# Patient Record
Sex: Female | Born: 1980 | Race: White | Hispanic: No | Marital: Married | Smoking: Current every day smoker
Health system: Southern US, Community
[De-identification: ages and names within clinical notes are randomized; demographics above are authoritative.]

## PROBLEM LIST (undated history)

## (undated) DIAGNOSIS — F5000 Anorexia nervosa, unspecified: Secondary | ICD-10-CM

## (undated) DIAGNOSIS — F32A Depression, unspecified: Secondary | ICD-10-CM

## (undated) DIAGNOSIS — I10 Essential (primary) hypertension: Secondary | ICD-10-CM

## (undated) DIAGNOSIS — R519 Headache, unspecified: Secondary | ICD-10-CM

## (undated) DIAGNOSIS — R51 Headache: Secondary | ICD-10-CM

## (undated) DIAGNOSIS — I499 Cardiac arrhythmia, unspecified: Secondary | ICD-10-CM

## (undated) DIAGNOSIS — F419 Anxiety disorder, unspecified: Secondary | ICD-10-CM

## (undated) DIAGNOSIS — F4310 Post-traumatic stress disorder, unspecified: Secondary | ICD-10-CM

## (undated) DIAGNOSIS — F329 Major depressive disorder, single episode, unspecified: Secondary | ICD-10-CM

## (undated) DIAGNOSIS — K8020 Calculus of gallbladder without cholecystitis without obstruction: Secondary | ICD-10-CM

## (undated) HISTORY — PX: CHALAZION EXCISION: SHX213

## (undated) HISTORY — PX: OTHER SURGICAL HISTORY: SHX169

## (undated) HISTORY — PX: FOOT SURGERY: SHX648

---
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
## 1999-03-02 ENCOUNTER — Emergency Department (HOSPITAL_COMMUNITY): Admission: EM | Admit: 1999-03-02 | Discharge: 1999-03-03 | Payer: Self-pay | Admitting: Emergency Medicine

## 1999-03-02 ENCOUNTER — Encounter: Payer: Self-pay | Admitting: Emergency Medicine

## 1999-08-30 ENCOUNTER — Emergency Department (HOSPITAL_COMMUNITY): Admission: EM | Admit: 1999-08-30 | Discharge: 1999-08-30 | Payer: Self-pay | Admitting: Emergency Medicine

## 1999-08-31 ENCOUNTER — Encounter: Payer: Self-pay | Admitting: Emergency Medicine

## 1999-09-01 ENCOUNTER — Emergency Department (HOSPITAL_COMMUNITY): Admission: EM | Admit: 1999-09-01 | Discharge: 1999-09-01 | Payer: Self-pay | Admitting: Emergency Medicine

## 1999-09-05 ENCOUNTER — Inpatient Hospital Stay (HOSPITAL_COMMUNITY): Admission: AD | Admit: 1999-09-05 | Discharge: 1999-09-05 | Payer: Self-pay | Admitting: *Deleted

## 1999-11-11 ENCOUNTER — Inpatient Hospital Stay (HOSPITAL_COMMUNITY): Admission: AD | Admit: 1999-11-11 | Discharge: 1999-11-11 | Payer: Self-pay | Admitting: *Deleted

## 1999-11-13 ENCOUNTER — Inpatient Hospital Stay (HOSPITAL_COMMUNITY): Admission: AD | Admit: 1999-11-13 | Discharge: 1999-11-13 | Payer: Self-pay | Admitting: Obstetrics & Gynecology

## 1999-11-13 ENCOUNTER — Ambulatory Visit (HOSPITAL_COMMUNITY): Admission: RE | Admit: 1999-11-13 | Discharge: 1999-11-13 | Payer: Self-pay | Admitting: Obstetrics & Gynecology

## 1999-12-20 ENCOUNTER — Ambulatory Visit (HOSPITAL_COMMUNITY): Admission: RE | Admit: 1999-12-20 | Discharge: 1999-12-20 | Payer: Self-pay | Admitting: Obstetrics

## 2000-01-10 ENCOUNTER — Inpatient Hospital Stay (HOSPITAL_COMMUNITY): Admission: AD | Admit: 2000-01-10 | Discharge: 2000-01-10 | Payer: Self-pay | Admitting: Obstetrics

## 2000-01-23 ENCOUNTER — Inpatient Hospital Stay (HOSPITAL_COMMUNITY): Admission: AD | Admit: 2000-01-23 | Discharge: 2000-01-23 | Payer: Self-pay | Admitting: *Deleted

## 2000-01-24 ENCOUNTER — Inpatient Hospital Stay (HOSPITAL_COMMUNITY): Admission: AD | Admit: 2000-01-24 | Discharge: 2000-01-24 | Payer: Self-pay | Admitting: Obstetrics

## 2000-01-26 ENCOUNTER — Inpatient Hospital Stay (HOSPITAL_COMMUNITY): Admission: AD | Admit: 2000-01-26 | Discharge: 2000-01-29 | Payer: Self-pay | Admitting: Obstetrics & Gynecology

## 2000-01-26 ENCOUNTER — Encounter: Payer: Self-pay | Admitting: *Deleted

## 2000-02-08 ENCOUNTER — Encounter: Admission: RE | Admit: 2000-02-08 | Discharge: 2000-02-08 | Payer: Self-pay | Admitting: Obstetrics

## 2000-02-13 ENCOUNTER — Inpatient Hospital Stay (HOSPITAL_COMMUNITY): Admission: AD | Admit: 2000-02-13 | Discharge: 2000-02-13 | Payer: Self-pay | Admitting: Obstetrics

## 2000-02-13 ENCOUNTER — Inpatient Hospital Stay (HOSPITAL_COMMUNITY): Admission: AD | Admit: 2000-02-13 | Discharge: 2000-02-13 | Payer: Self-pay | Admitting: *Deleted

## 2000-02-13 ENCOUNTER — Inpatient Hospital Stay (HOSPITAL_COMMUNITY): Admission: EM | Admit: 2000-02-13 | Discharge: 2000-02-13 | Payer: Self-pay | Admitting: *Deleted

## 2000-02-15 ENCOUNTER — Inpatient Hospital Stay (HOSPITAL_COMMUNITY): Admission: AD | Admit: 2000-02-15 | Discharge: 2000-02-15 | Payer: Self-pay | Admitting: Obstetrics & Gynecology

## 2000-02-19 ENCOUNTER — Inpatient Hospital Stay (HOSPITAL_COMMUNITY): Admission: AD | Admit: 2000-02-19 | Discharge: 2000-02-19 | Payer: Self-pay | Admitting: Obstetrics & Gynecology

## 2000-02-22 ENCOUNTER — Encounter: Admission: RE | Admit: 2000-02-22 | Discharge: 2000-02-22 | Payer: Self-pay | Admitting: Obstetrics

## 2000-02-26 ENCOUNTER — Inpatient Hospital Stay (HOSPITAL_COMMUNITY): Admission: AD | Admit: 2000-02-26 | Discharge: 2000-02-26 | Payer: Self-pay | Admitting: Obstetrics & Gynecology

## 2000-02-29 ENCOUNTER — Inpatient Hospital Stay (HOSPITAL_COMMUNITY): Admission: AD | Admit: 2000-02-29 | Discharge: 2000-02-29 | Payer: Self-pay | Admitting: Obstetrics

## 2000-02-29 ENCOUNTER — Encounter: Admission: RE | Admit: 2000-02-29 | Discharge: 2000-02-29 | Payer: Self-pay | Admitting: Obstetrics

## 2000-02-29 ENCOUNTER — Inpatient Hospital Stay (HOSPITAL_COMMUNITY): Admission: AD | Admit: 2000-02-29 | Discharge: 2000-02-29 | Payer: Self-pay | Admitting: Obstetrics & Gynecology

## 2000-03-05 ENCOUNTER — Inpatient Hospital Stay (HOSPITAL_COMMUNITY): Admission: AD | Admit: 2000-03-05 | Discharge: 2000-03-05 | Payer: Self-pay | Admitting: Obstetrics

## 2000-03-07 ENCOUNTER — Encounter: Admission: RE | Admit: 2000-03-07 | Discharge: 2000-03-07 | Payer: Self-pay | Admitting: Obstetrics

## 2000-03-11 ENCOUNTER — Inpatient Hospital Stay (HOSPITAL_COMMUNITY): Admission: AD | Admit: 2000-03-11 | Discharge: 2000-03-11 | Payer: Self-pay | Admitting: Obstetrics

## 2000-03-14 ENCOUNTER — Encounter: Admission: RE | Admit: 2000-03-14 | Discharge: 2000-03-14 | Payer: Self-pay | Admitting: Obstetrics

## 2000-03-19 ENCOUNTER — Encounter: Payer: Self-pay | Admitting: *Deleted

## 2000-03-19 ENCOUNTER — Inpatient Hospital Stay (HOSPITAL_COMMUNITY): Admission: AD | Admit: 2000-03-19 | Discharge: 2000-03-24 | Payer: Self-pay | Admitting: *Deleted

## 2000-03-28 ENCOUNTER — Inpatient Hospital Stay (HOSPITAL_COMMUNITY): Admission: AD | Admit: 2000-03-28 | Discharge: 2000-03-28 | Payer: Self-pay | Admitting: Obstetrics & Gynecology

## 2000-03-28 ENCOUNTER — Encounter: Admission: RE | Admit: 2000-03-28 | Discharge: 2000-03-28 | Payer: Self-pay | Admitting: Obstetrics

## 2000-03-28 ENCOUNTER — Encounter (HOSPITAL_COMMUNITY): Admission: RE | Admit: 2000-03-28 | Discharge: 2000-04-25 | Payer: Self-pay | Admitting: Obstetrics

## 2000-04-04 ENCOUNTER — Encounter: Admission: RE | Admit: 2000-04-04 | Discharge: 2000-04-04 | Payer: Self-pay | Admitting: Obstetrics

## 2000-04-09 ENCOUNTER — Inpatient Hospital Stay (HOSPITAL_COMMUNITY): Admission: AD | Admit: 2000-04-09 | Discharge: 2000-04-09 | Payer: Self-pay | Admitting: Obstetrics

## 2000-04-10 ENCOUNTER — Inpatient Hospital Stay (HOSPITAL_COMMUNITY): Admission: AD | Admit: 2000-04-10 | Discharge: 2000-04-10 | Payer: Self-pay | Admitting: Obstetrics

## 2000-04-10 ENCOUNTER — Inpatient Hospital Stay (HOSPITAL_COMMUNITY): Admission: AD | Admit: 2000-04-10 | Discharge: 2000-04-10 | Payer: Self-pay | Admitting: *Deleted

## 2000-04-11 ENCOUNTER — Encounter: Admission: RE | Admit: 2000-04-11 | Discharge: 2000-04-11 | Payer: Self-pay | Admitting: Obstetrics

## 2000-04-14 ENCOUNTER — Observation Stay (HOSPITAL_COMMUNITY): Admission: AD | Admit: 2000-04-14 | Discharge: 2000-04-15 | Payer: Self-pay | Admitting: *Deleted

## 2000-04-16 ENCOUNTER — Inpatient Hospital Stay (HOSPITAL_COMMUNITY): Admission: AD | Admit: 2000-04-16 | Discharge: 2000-04-16 | Payer: Self-pay | Admitting: Obstetrics & Gynecology

## 2000-04-18 ENCOUNTER — Encounter: Admission: RE | Admit: 2000-04-18 | Discharge: 2000-04-18 | Payer: Self-pay | Admitting: Obstetrics & Gynecology

## 2000-04-21 ENCOUNTER — Inpatient Hospital Stay (HOSPITAL_COMMUNITY): Admission: AD | Admit: 2000-04-21 | Discharge: 2000-04-21 | Payer: Self-pay | Admitting: Obstetrics

## 2000-04-22 ENCOUNTER — Inpatient Hospital Stay (HOSPITAL_COMMUNITY): Admission: AD | Admit: 2000-04-22 | Discharge: 2000-04-22 | Payer: Self-pay | Admitting: Obstetrics

## 2000-04-23 ENCOUNTER — Inpatient Hospital Stay (HOSPITAL_COMMUNITY): Admission: AD | Admit: 2000-04-23 | Discharge: 2000-04-25 | Payer: Self-pay | Admitting: *Deleted

## 2000-05-04 ENCOUNTER — Inpatient Hospital Stay (HOSPITAL_COMMUNITY): Admission: AD | Admit: 2000-05-04 | Discharge: 2000-05-04 | Payer: Self-pay | Admitting: *Deleted

## 2000-05-06 ENCOUNTER — Inpatient Hospital Stay (HOSPITAL_COMMUNITY): Admission: AD | Admit: 2000-05-06 | Discharge: 2000-05-06 | Payer: Self-pay | Admitting: Maternal and Fetal Medicine

## 2000-07-23 ENCOUNTER — Encounter: Payer: Self-pay | Admitting: Nephrology

## 2000-07-23 ENCOUNTER — Ambulatory Visit (HOSPITAL_COMMUNITY): Admission: RE | Admit: 2000-07-23 | Discharge: 2000-07-23 | Payer: Self-pay | Admitting: Family Medicine

## 2000-12-24 HISTORY — PX: WISDOM TOOTH EXTRACTION: SHX21

## 2000-12-24 HISTORY — PX: TUBAL LIGATION: SHX77

## 2001-02-03 ENCOUNTER — Inpatient Hospital Stay (HOSPITAL_COMMUNITY): Admission: AD | Admit: 2001-02-03 | Discharge: 2001-02-03 | Payer: Self-pay | Admitting: *Deleted

## 2001-02-03 ENCOUNTER — Encounter: Payer: Self-pay | Admitting: Obstetrics

## 2001-04-01 ENCOUNTER — Inpatient Hospital Stay (HOSPITAL_COMMUNITY): Admission: AD | Admit: 2001-04-01 | Discharge: 2001-04-01 | Payer: Self-pay | Admitting: *Deleted

## 2001-04-01 ENCOUNTER — Encounter: Payer: Self-pay | Admitting: Obstetrics and Gynecology

## 2001-04-29 ENCOUNTER — Other Ambulatory Visit: Admission: RE | Admit: 2001-04-29 | Discharge: 2001-04-29 | Payer: Self-pay | Admitting: Obstetrics and Gynecology

## 2001-05-20 ENCOUNTER — Ambulatory Visit: Admission: RE | Admit: 2001-05-20 | Discharge: 2001-05-20 | Payer: Self-pay | Admitting: Obstetrics and Gynecology

## 2001-05-20 ENCOUNTER — Encounter: Payer: Self-pay | Admitting: Obstetrics and Gynecology

## 2001-05-27 ENCOUNTER — Other Ambulatory Visit: Admission: RE | Admit: 2001-05-27 | Discharge: 2001-05-27 | Payer: Self-pay | Admitting: Obstetrics and Gynecology

## 2001-06-11 ENCOUNTER — Inpatient Hospital Stay (HOSPITAL_COMMUNITY): Admission: AD | Admit: 2001-06-11 | Discharge: 2001-06-11 | Payer: Self-pay | Admitting: Obstetrics and Gynecology

## 2001-06-14 ENCOUNTER — Inpatient Hospital Stay (HOSPITAL_COMMUNITY): Admission: AD | Admit: 2001-06-14 | Discharge: 2001-06-14 | Payer: Self-pay | Admitting: Obstetrics and Gynecology

## 2001-06-15 ENCOUNTER — Inpatient Hospital Stay (HOSPITAL_COMMUNITY): Admission: AD | Admit: 2001-06-15 | Discharge: 2001-06-15 | Payer: Self-pay | Admitting: Obstetrics and Gynecology

## 2001-06-16 ENCOUNTER — Inpatient Hospital Stay (HOSPITAL_COMMUNITY): Admission: AD | Admit: 2001-06-16 | Discharge: 2001-06-16 | Payer: Self-pay | Admitting: Obstetrics and Gynecology

## 2001-06-22 ENCOUNTER — Inpatient Hospital Stay (HOSPITAL_COMMUNITY): Admission: AD | Admit: 2001-06-22 | Discharge: 2001-06-22 | Payer: Self-pay | Admitting: Obstetrics and Gynecology

## 2001-06-29 ENCOUNTER — Inpatient Hospital Stay (HOSPITAL_COMMUNITY): Admission: AD | Admit: 2001-06-29 | Discharge: 2001-06-29 | Payer: Self-pay | Admitting: Obstetrics and Gynecology

## 2001-07-03 ENCOUNTER — Inpatient Hospital Stay (HOSPITAL_COMMUNITY): Admission: AD | Admit: 2001-07-03 | Discharge: 2001-07-03 | Payer: Self-pay | Admitting: Obstetrics and Gynecology

## 2001-07-27 ENCOUNTER — Inpatient Hospital Stay (HOSPITAL_COMMUNITY): Admission: AD | Admit: 2001-07-27 | Discharge: 2001-07-30 | Payer: Self-pay | Admitting: Obstetrics and Gynecology

## 2001-11-18 ENCOUNTER — Encounter (INDEPENDENT_AMBULATORY_CARE_PROVIDER_SITE_OTHER): Payer: Self-pay | Admitting: Specialist

## 2001-11-18 ENCOUNTER — Ambulatory Visit (HOSPITAL_COMMUNITY): Admission: RE | Admit: 2001-11-18 | Discharge: 2001-11-18 | Payer: Self-pay | Admitting: Obstetrics and Gynecology

## 2001-12-24 ENCOUNTER — Emergency Department (HOSPITAL_COMMUNITY): Admission: EM | Admit: 2001-12-24 | Discharge: 2001-12-24 | Payer: Self-pay | Admitting: Emergency Medicine

## 2001-12-24 ENCOUNTER — Encounter: Payer: Self-pay | Admitting: Emergency Medicine

## 2001-12-31 ENCOUNTER — Inpatient Hospital Stay (HOSPITAL_COMMUNITY): Admission: AD | Admit: 2001-12-31 | Discharge: 2001-12-31 | Payer: Self-pay | Admitting: Obstetrics

## 2002-03-24 ENCOUNTER — Other Ambulatory Visit: Admission: RE | Admit: 2002-03-24 | Discharge: 2002-03-24 | Payer: Self-pay | Admitting: Obstetrics and Gynecology

## 2002-05-13 ENCOUNTER — Emergency Department (HOSPITAL_COMMUNITY): Admission: EM | Admit: 2002-05-13 | Discharge: 2002-05-13 | Payer: Self-pay | Admitting: Emergency Medicine

## 2002-05-13 ENCOUNTER — Encounter: Payer: Self-pay | Admitting: Emergency Medicine

## 2002-06-15 ENCOUNTER — Other Ambulatory Visit: Admission: RE | Admit: 2002-06-15 | Discharge: 2002-06-15 | Payer: Self-pay | Admitting: Obstetrics and Gynecology

## 2002-10-05 ENCOUNTER — Ambulatory Visit (HOSPITAL_COMMUNITY): Admission: RE | Admit: 2002-10-05 | Discharge: 2002-10-05 | Payer: Self-pay | Admitting: Family Medicine

## 2002-10-05 ENCOUNTER — Encounter: Payer: Self-pay | Admitting: Family Medicine

## 2003-09-16 ENCOUNTER — Inpatient Hospital Stay (HOSPITAL_COMMUNITY): Admission: AD | Admit: 2003-09-16 | Discharge: 2003-09-16 | Payer: Self-pay | Admitting: *Deleted

## 2003-09-22 ENCOUNTER — Encounter: Payer: Self-pay | Admitting: Emergency Medicine

## 2003-09-22 ENCOUNTER — Emergency Department (HOSPITAL_COMMUNITY): Admission: EM | Admit: 2003-09-22 | Discharge: 2003-09-22 | Payer: Self-pay

## 2003-12-31 ENCOUNTER — Emergency Department (HOSPITAL_COMMUNITY): Admission: EM | Admit: 2003-12-31 | Discharge: 2003-12-31 | Payer: Self-pay | Admitting: Emergency Medicine

## 2004-02-18 ENCOUNTER — Emergency Department (HOSPITAL_COMMUNITY): Admission: EM | Admit: 2004-02-18 | Discharge: 2004-02-18 | Payer: Self-pay | Admitting: Emergency Medicine

## 2004-06-19 ENCOUNTER — Encounter: Admission: RE | Admit: 2004-06-19 | Discharge: 2004-06-19 | Payer: Self-pay | Admitting: Family Medicine

## 2004-10-24 ENCOUNTER — Emergency Department (HOSPITAL_COMMUNITY): Admission: EM | Admit: 2004-10-24 | Discharge: 2004-10-24 | Payer: Self-pay | Admitting: Emergency Medicine

## 2005-04-10 ENCOUNTER — Emergency Department (HOSPITAL_COMMUNITY): Admission: EM | Admit: 2005-04-10 | Discharge: 2005-04-10 | Payer: Self-pay | Admitting: Emergency Medicine

## 2005-04-12 ENCOUNTER — Other Ambulatory Visit: Admission: RE | Admit: 2005-04-12 | Discharge: 2005-04-12 | Payer: Self-pay | Admitting: Family Medicine

## 2007-10-29 ENCOUNTER — Emergency Department (HOSPITAL_COMMUNITY): Admission: EM | Admit: 2007-10-29 | Discharge: 2007-10-29 | Payer: Self-pay | Admitting: Emergency Medicine

## 2008-10-09 ENCOUNTER — Emergency Department (HOSPITAL_COMMUNITY): Admission: EM | Admit: 2008-10-09 | Discharge: 2008-10-09 | Payer: Self-pay | Admitting: Emergency Medicine

## 2009-03-16 ENCOUNTER — Emergency Department (HOSPITAL_COMMUNITY): Admission: EM | Admit: 2009-03-16 | Discharge: 2009-03-16 | Payer: Self-pay | Admitting: Emergency Medicine

## 2009-08-11 ENCOUNTER — Emergency Department (HOSPITAL_COMMUNITY): Admission: EM | Admit: 2009-08-11 | Discharge: 2009-08-11 | Payer: Self-pay | Admitting: Emergency Medicine

## 2009-08-15 ENCOUNTER — Emergency Department (HOSPITAL_COMMUNITY): Admission: EM | Admit: 2009-08-15 | Discharge: 2009-08-15 | Payer: Self-pay | Admitting: Emergency Medicine

## 2010-03-23 ENCOUNTER — Emergency Department (HOSPITAL_COMMUNITY): Admission: EM | Admit: 2010-03-23 | Discharge: 2010-03-24 | Payer: Self-pay | Admitting: Emergency Medicine

## 2010-03-27 ENCOUNTER — Emergency Department (HOSPITAL_COMMUNITY): Admission: EM | Admit: 2010-03-27 | Discharge: 2010-03-27 | Payer: Self-pay | Admitting: Emergency Medicine

## 2011-03-14 LAB — CBC
MCV: 103.4 fL — ABNORMAL HIGH (ref 78.0–100.0)
Platelets: 252 10*3/uL (ref 150–400)
WBC: 7.1 10*3/uL (ref 4.0–10.5)

## 2011-03-14 LAB — PREGNANCY, URINE: Preg Test, Ur: NEGATIVE

## 2011-03-14 LAB — BASIC METABOLIC PANEL
BUN: 7 mg/dL (ref 6–23)
Chloride: 107 mEq/L (ref 96–112)
Creatinine, Ser: 0.68 mg/dL (ref 0.4–1.2)
GFR calc non Af Amer: >60 mL/min (ref >60)

## 2011-03-14 LAB — URINALYSIS, ROUTINE W REFLEX MICROSCOPIC
Ketones, ur: NEGATIVE mg/dL
Nitrite: NEGATIVE
Protein, ur: NEGATIVE mg/dL
Urobilinogen, UA: 1 mg/dL (ref 0.0–1.0)
pH: 6.5 (ref 5.0–8.0)

## 2011-03-14 LAB — DIFFERENTIAL
Eosinophils Absolute: 0.2 10*3/uL (ref 0.0–0.7)
Lymphs Abs: 1.7 10*3/uL (ref 0.7–4.0)
Neutrophils Relative %: 64 % (ref 43–77)

## 2011-03-18 LAB — CBC
HCT: 48.5 % — ABNORMAL HIGH (ref 36.0–46.0)
MCV: 102.5 fL — ABNORMAL HIGH (ref 78.0–100.0)
Platelets: 291 10*3/uL (ref 150–400)
RBC: 4.73 MIL/uL (ref 3.87–5.11)
RDW: 12.7 % (ref 11.5–15.5)
WBC: 8.6 10*3/uL (ref 4.0–10.5)

## 2011-03-18 LAB — DIFFERENTIAL
Eosinophils Absolute: 0.3 10*3/uL (ref 0.0–0.7)
Lymphs Abs: 2.9 10*3/uL (ref 0.7–4.0)
Monocytes Absolute: 0.6 10*3/uL (ref 0.1–1.0)
Monocytes Relative: 7 % (ref 3–12)
Neutrophils Relative %: 55 % (ref 43–77)

## 2011-03-18 LAB — RAPID URINE DRUG SCREEN, HOSP PERFORMED
Amphetamines: NOT DETECTED
Barbiturates: NOT DETECTED
Benzodiazepines: POSITIVE — AB
Opiates: POSITIVE — AB
Tetrahydrocannabinol: POSITIVE — AB

## 2011-03-18 LAB — COMPREHENSIVE METABOLIC PANEL
ALT: 19 U/L (ref 0–35)
Albumin: 3.8 g/dL (ref 3.5–5.2)
Calcium: 8.1 mg/dL — ABNORMAL LOW (ref 8.4–10.5)
GFR calc Af Amer: >60 mL/min (ref >60)
Glucose, Bld: 98 mg/dL (ref 70–99)
Sodium: 140 mEq/L (ref 135–145)
Total Protein: 6.9 g/dL (ref 6.0–8.3)

## 2011-03-18 LAB — URINALYSIS, ROUTINE W REFLEX MICROSCOPIC
Bilirubin Urine: NEGATIVE
Ketones, ur: NEGATIVE mg/dL
Nitrite: NEGATIVE
Protein, ur: NEGATIVE mg/dL
Specific Gravity, Urine: 1.011 (ref 1.005–1.030)
Urobilinogen, UA: 1 mg/dL (ref 0.0–1.0)

## 2011-03-31 LAB — DIFFERENTIAL
Basophils Absolute: 0 10*3/uL (ref 0.0–0.1)
Basophils Relative: 0 % (ref 0–1)
Basophils Relative: 1 % (ref 0–1)
Eosinophils Absolute: 0.2 10*3/uL (ref 0.0–0.7)
Lymphocytes Relative: 15 % (ref 12–46)
Lymphs Abs: 1.7 10*3/uL (ref 0.7–4.0)
Neutro Abs: 8.4 10*3/uL — ABNORMAL HIGH (ref 1.7–7.7)
Neutro Abs: 8.4 10*3/uL — ABNORMAL HIGH (ref 1.7–7.7)
Neutrophils Relative %: 76 % (ref 43–77)
Neutrophils Relative %: 77 % (ref 43–77)

## 2011-03-31 LAB — COMPREHENSIVE METABOLIC PANEL
ALT: 22 U/L (ref 0–35)
AST: 29 U/L (ref 0–37)
Albumin: 3.8 g/dL (ref 3.5–5.2)
Alkaline Phosphatase: 47 U/L (ref 39–117)
BUN: 3 mg/dL — ABNORMAL LOW (ref 6–23)
CO2: 24 mEq/L (ref 19–32)
Calcium: 9 mg/dL (ref 8.4–10.5)
Calcium: 9 mg/dL (ref 8.4–10.5)
Creatinine, Ser: 0.74 mg/dL (ref 0.4–1.2)
GFR calc Af Amer: >60 mL/min (ref >60)
GFR calc non Af Amer: >60 mL/min (ref >60)
Glucose, Bld: 95 mg/dL (ref 70–99)
Sodium: 139 mEq/L (ref 135–145)

## 2011-03-31 LAB — CK TOTAL AND CKMB (NOT AT ARMC): Relative Index: INVALID (ref 0.0–2.5)

## 2011-03-31 LAB — CBC
MCHC: 34.8 g/dL (ref 30.0–36.0)
MCHC: 34.8 g/dL (ref 30.0–36.0)
MCV: 98.8 fL (ref 78.0–100.0)
MCV: 99.1 fL (ref 78.0–100.0)
Platelets: 194 10*3/uL (ref 150–400)
RBC: 4.89 MIL/uL (ref 3.87–5.11)
RDW: 13.9 % (ref 11.5–15.5)
WBC: 10.9 10*3/uL — ABNORMAL HIGH (ref 4.0–10.5)

## 2011-03-31 LAB — TROPONIN I: Troponin I: 0.01 ng/mL (ref 0.00–0.06)

## 2011-03-31 LAB — POCT CARDIAC MARKERS
CKMB, poc: <1 ng/mL — ABNORMAL LOW (ref 1.0–8.0)
Myoglobin, poc: 41.3 ng/mL (ref 12–200)

## 2011-03-31 LAB — LIPASE, BLOOD: Lipase: 15 U/L (ref 11–59)

## 2011-03-31 LAB — D-DIMER, QUANTITATIVE: D-Dimer, Quant: 0.24 ug/mL-FEU (ref 0.00–0.48)

## 2011-05-11 NOTE — Op Note (Signed)
[facility]  Patient:    Gina Daniels, Gina Daniels Visit Number: [id number] MRN: [id number]          Service Type: DSU Location: [facility] Attending Physician:  Jaymes Graff A Dictated by:   Pierre Bali Normand Sloop, M.D. Admit Date:  11/18/2001                             Operative Report  PREOPERATIVE DIAGNOSES:       Multiparity, desires sterilization, cervical dysplasia with chronic pelvic pain.  POSTOPERATIVE DIAGNOSES:      Probable endometriosis, multiparity, desires sterilization.  PROCEDURE:                    Diagnostic laparoscopy, bilateral tubal ligation with fulguration, biopsy of cul-de-sac/peritoneum, loop electrosurgical excision procedure, endocervical curettage.  SURGEON:                      Naima A. Normand Sloop, M.D.  ASSISTANT:                    Janine Limbo, M.D.  ESTIMATED BLOOD LOSS:         Minimal.  INTRAVENOUS FLUIDS:           Crystalloid 1600 cc.  COMPLICATIONS:                None.  FINDINGS:                     Powder burn lesions in the cul-de-sac and along the right ureter, peritoneum.  No other lesions identified.  Normal appearing uterus, tubes, and ovaries bilaterally.  Normal appendix.  Normal abdominal anatomy.  Patient to recovery room in stable condition.  PROCEDURE IN DETAIL:          The patient was taken to the operating room where she was placed in the dorsal lithotomy position and prepped and draped in a normal sterile fashion.  A Vivelle speculum was then placed into the vagina.  The anterior lip of the cervix was grasped with a single tooth tenaculum.  An endocervical curettage was done and the acorn was attached to the tenaculum with a ______ manipulated the uterus.  Attention was then turned to the abdomen where a 10 mm infraumbilical horizontal incision was made with a knife and the Veress needle was placed into the abdominal cavity while tenting the abdominal wall at a 45 degree angle.  Fluid filled  syringe and decrease in pressure with CO2 gas confirmed proper intra-abdominal placement.  The abdomen was filled with 3.5 L CO2 gas.  A 10 mm trocar was placed without difficulty and intra-abdominal placement was confirmed by the laparoscope.  Attentive survey of the abdomen and pelvis was done starting above the uterus at the bladder and then both tubes and ovaries were identified and noted to be normal.  The uterosacrals were normal.  There were some powder burn lesions in the cul-de-sac, along the peritoneum, and along the left ureter.  Appendix was normal.  Liver was normal.  Bowels were normal. All other remaining areas were normal.  At this point a needle was placed into the peritoneum and normal saline was introduced as a means to elevate the peritoneum and then a biopsy was taken of the tissue.  Hemostasis was assured. Attention was then turned to the patients tubes where a Klepinger was placed in the mid isthmic portion of  the tube.  The right fallopian tube was identified, followed to the fimbriated end.  Klepingers were placed in the mid isthmic portion of the tube and fulgurated x 2.  Hemostasis was noted. The patients left fallopian tube was identified and manipulated in a similar fashion and fulgurated x 2.  Hemostasis was noted.  All instruments were removed from the abdomen and direct visualization with the laparoscope after desufflation of the CO2 gas.  Hemostasis was noted.  Before the survey of the abdomen was done a second 5 mm port was placed 2 cm above the symphysis pubis. A 5 mm horizontal skin incision was made in the midline and a 5 mm trocar was placed under direct visualization.  At that time then the survey of the abdomen and pelvis occurred followed by the procedures.  All instruments were removed under direct visualization with the laparoscope.  Sponge, lap, and needle counts were correct x 2.  The suprapubic incision was repaired with 4-0 Vicryl in a  subcuticular fashion.  The fascia and the 10 mm port were closed with 0 Vicryl and a urological GU needle.  The skin was closed with 4-0 Vicryl in a subcuticular fashion.  Attention was then turned to the patients vagina where the coated Vivelle speculum was placed into the vagina.  The cervix was then painted with saline first and then Lugol and abnormal areas were seen at 12, 11, and 1 oclock.  Size 11 x 15 mm loop was used to remove the abnormal areas of the cervix.  The remaining cervical bed was made hemostatic using bulb cautery.  Lugol was then applied.  Hemostasis was noted.  The tenaculum was removed from the cervix with good hemostasis noted.  All instruments removed from the vagina.  Sponge, lap, and needle counts were correct x 2. Patient went to the recovery room in stable condition. Dictated by:   Pierre Bali. Normand Sloop, M.D. Attending Physician:  Michael Litter DD:  11/18/01 TD:  11/18/01 Job: [id number]

## 2011-05-11 NOTE — H&P (Signed)
[facility]  Patient:    Gina Daniels, Gina Daniels Visit Number: [id number] MRN: [id number]          Service Type: OBS Location: 910B 9162 01 Attending Physician:  Shaune Spittle Dictated by:   Philipp Deputy, C.N.M. Admit Date:  07/27/2001                           History and Physical  CONTINUATION  DATE OF BIRTH:                1981/03/19.  OBSTETRICAL HISTORY: (Continued) In July of 2001 at approximately three weeks gestation she had a spontaneous Ab with no D&C and no complications. Present pregnancy is her fourth pregnancy.  PAST MEDICAL HISTORY:         She reports having had the usual childhood illnesses. For contraception in the past she has used oral contraceptives which she discontinued in December of 2001. She has a history of colposcopy in 2001 and in 2002 with her abnormal Pap smears. She reports having a urinary tract infection in 1999 and she reports being a smoker in the past which she discontinued in December of 2001 with the pregnancy.  ALLERGIES:                    LATEX, PENICILLIN, CODEINE, and to SULFA. She gets shortness of breath and rash with all of these.  FAMILY HISTORY:               She has a father with myocardial infarctions x 2. Mother with chronic hypertension. Maternal grandmother with asthma. Maternal grandmother with insulin-dependent diabetes mellitus who is deceased.  PAST SURGICAL HISTORY:        Negative.  GENETIC HISTORY:              Negative.  SOCIAL HISTORY:               Patient is single. Father of the babys name is Fayrene Fearing. He is involved and supportive. Patient has a high school education. Father of the baby has a tenth grade education. They deny any alcohol, tobacco, or illicit drug use after the positive pregnancy test.  OBJECTIVE DATA:  VITAL SIGNS:                  Stable. She is afebrile.  HEENT:                        Within normal limits.  CHEST:                        Clear to  auscultation.  HEART:                        Regular to rate and rhythm.  ABDOMEN:                      Gravid in its contour with uterine contractions approximately every six minutes which are mild. Fetal heart rate is reactive and reassuring.  PELVIC:                       Cervical exam is 2 to 3 cm, 80% effaced, vertex, -2 with copious clear fluid that is positive Nitrazine and positive fern.  EXTREMITIES:  Within normal limits.  ASSESSMENT:                   1. Intrauterine pregnancy at 36 weeks.                               2. Spontaneous rupture of membranes with clear                                  fluid.                               3. Negative group B streptococcus.  PLAN:                         1. Admit to birthing suite per consult with                                  Dr. Pennie Rushing.                               2. Routine CNM orders.                               3. Discuss options with the patient and father                                  of the baby regarding expectant management                                  versus Pitocin augmentation. Patient and the                                  father of the baby elect expectant                                  management at this time. Dictated by:   Philipp Deputy, C.N.M. Attending Physician:  Shaune Spittle DD:  07/27/01 TD:  07/27/01 Job: [id number]

## 2011-05-11 NOTE — Discharge Summary (Signed)
[facility]  Patient:    Gina Daniels                        MRN: [id number] Adm. Date:  [id number] Disc. Date: 01/28/00 Attending:  Antionette Char Dictator:   Brandt Loosen, M.D.                           Discharge Summary  DISCHARGE DIAGNOSES:          1. Cervicitis.                               2. Preterm contractions.                               3. Viral gastroenteritis.  DISCHARGE MEDICATIONS:        1. Clindamycin 300 mg p.o. q.i.d. x 7 days.                               2. Motrin 600 mg q.6h. x 48 hours, not to exceed.  FOLLOWUP INSTRUCTIONS:        The patient is to return for any signs of labor such as uterine contractions, bleeding, gush of fluids, decreased fetal movement. The patient is to follow up at [facility].  She is to call on Monday for an appointment to be seen in one week.  HOSPITAL COURSE:              #1 - CERVICITIS:  The patient was admitted on February 1 and found to have ______ red cervix with wbcs too numerous to count with too numerous to count bacteria.  Thus far, cultures negative, negative for GC and Chlamydia.  She continued to have occasional discharge along with the diarrhea. She had one episode of "fluid leaking" on speculum examination, burning and Nitrazine negative on admission ferning and Nitrazine negative.  No evidence of  rupture of membranes.  The patient was placed on IV clindamycin and gentamicin ith improvement in discharge and will be discharged home on clindamycin 300 mg p.o.  q.i.d. x 7 days.                                #2 - PRETERM CONTRACTIONS:  Patient with contractions every 10 minutes with evidence of mild dehydration secondary to a diarrheal illness.  She was placed on IV fluids with improvement in her hydration status.  She continued to have occasional irritability but had no cervical hospitalization throughout 72 hours of admission.  The patient is to be discharged  with Motrin 600 mg p.o. q.6h. x 48 hours and to follow [facility] at [facility].                                #3 - VIRAL GASTROENTERITIS:  Patient admitted with a diarrheal illness of two to three bouts per day of watery diarrhea and no blood  noted in the stool.  This improved throughout the hospitalization and was resolved by the time of discharge.  The patient had been placed on Imodium AD during hospitalization with resolution of the diarrhea.  The patient is to return for ny signs of preterm labor. DD:  01/28/00 TD:  01/29/00 Job: [id number]

## 2011-05-11 NOTE — H&P (Signed)
[facility]  Patient:    Gina Daniels, Gina Daniels Visit Number: [id number] MRN: [id number]          Service Type: Attending:  Naima A. Normand Sloop, M.D. Dictated by:   Pierre Bali. Normand Sloop, M.D.                           History and Physical  OFFICE MEDICAL RECORD NUMBER:  [phone]  HISTORY OF PRESENT ILLNESS:   Patient is a 30 year old G26, P2-0-2-2, who presented to my office complaining of pelvic pain, desire for tubal ligation and the need for a LEEP.  The patient presented in June after having infant, saying that she was having some pelvic pain and had had it for some time, which was unrelieved by pain medications, especially during a routine GYN exam.  Patient says that she has tried over-the-counter pain medicine without any relief.  Her cultures done on September 23, 2001 were found to be negative. Her ultrasound done October 28, 2001 was found to be with no active disease. Patient has a retroverted uterus measuring 7.8 x 3.1 x 4.0, normal ovaries bilaterally.  Patient denied having any change in bowel habits or bladder habits.  Patient has no significant past medical or surgical history.  PAST GYNECOLOGICAL HISTORY:   Significant for abnormal Pap smear.  Patient had a colposcopy on September 22, 2001 for high-grade squamous intraepithelial lesion.  On the colposcopy, there was found to be some mosaicism at 1 oclock and acetowhite changes at 6 and 2 oclock.  The patients biopsy at 6 oclock was significant for CIN-1 and the biopsy at 1 oclock was significant for moderate dysplasia, CIN-2.  The ECC was benign.  Patient has had abnormal Pap smear and colposcopy in the past.  FAMILY HISTORY:               Significant for paternal grandmother with breast cancer and a mother with hypertension and maternal grandmother with diabetes. No ovarian cancer.  SOCIAL HISTORY:               Patient smokes a half a pack to one and a half pack a day for seven years, occasional  alcohol use and no illicit drug use.  ALLERGIES:                    Her allergies include PENICILLIN, CODEINE, SULFA, LATEX and ADHESIVES.  PHYSICAL EXAMINATION:  VITAL SIGNS:                  Patients blood pressure was 100/70.  HEART:                        Regular.  LUNGS:                        Clear.  ABDOMEN:                      Soft and nontender.  PELVIC:                       Cervix:  She had mild CMT without any lesions. Uterus was normal size, shape and consistency.  Adnexa had no masses.  ASSESSMENT:                   1. 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                               2. Colposcopic biopsy at 1 oclock -- moderate                                  dysplasia, cervical intraepithelial                                  neoplasia, grade 2.  Endocervical curettage                                  was negative.                               3. Chronic pelvic pain.                               4. Desires sterilization.  PLAN:                         Patient is for diagnostic scope, bilateral tubal ligation.  Tubal papers were signed.  The risks and benefits of the tubal being but not limited to bleeding, infection, damage to uterus, tubes, ovaries, bowel, bladder and ureters were explained to the patient.  Also, because of the patients young age, she was given a review of all forms of contraception; she still was adamant about having the tubal ligation, even telling that she probably would have regret.  Will also, if endometriosis at the time of the tubal, due to the chronic pelvic pain, perform a diagnostic laparoscopy.  Patient also for LEEP for cervical dysplasia.  She understands the risks of bleeding, infection, damage to the cervix and a chance of cervical incompetence or cervical stenosis and the chance that she will become pregnant.  Patient also understands that the  possible failure rate for tubal ligation is about 1 in 250. Dictated by:   Pierre Bali. Normand Sloop, M.D. Attending:  Naima A. Dillard, M.D. DD:  11/17/01 TD:  11/17/01 Job: [id number]

## 2011-05-11 NOTE — H&P (Signed)
[facility]  Patient:    YANIS, JUMA Visit Number: [id number] MRN: [id number]          Service Type: OBS Location: 910B 9162 01 Attending Physician:  Shaune Spittle Dictated by:   Philipp Deputy, C.N.M. Admit Date:  07/27/2001                           History and Physical  INCOMPLETE  DATE OF BIRTH:                30-Nov-1981.  HISTORY:                      Ms. Shearon Stalls is a 30 year old, gravida 4, para 1-0-2-1 at 46 weeks who presents with spontaneous rupture of membranes at 2:15 a.m. with clear fluid. She reports positive fetal movement, denies vaginal bleeding, nausea and vomiting, headache, or visual disturbances. Her pregnancy has been followed by the [facility] certified nurse midwife service and has been remarkable for (1) history of preterm labor with term delivery, (2) abnormal Pap smear, (3) rubella nonimmune, (4) latex allergy, (5) positive fetal fibronectin, (6) desires postpartum bilateral tubal ligation.  PRENATAL LABORATORY DATA:     Her prenatal labs were collected on February 03, 2001. Hemoglobin 12.9, hematocrit 35.6, platelets 225,000, blood type O positive, antibody negative, RPR nonreactive, rubella nonimmune, hepatitis B surface antigen negative, HIV negative. Pap smear with moderate to severe dysplasia. Gonorrhea negative, Chlamydia negative, maternal serum alpha fetoprotein on March 20, 2001 was within normal limits. One-hour glucola on May 27, 2001 was 117 with hemoglobin at that time 11.1. Culture of the vaginal tract on July 23, 2001 was negative for beta strep.  HISTORY OF PRESENT PREGNANCY: She presented for care at [facility] on March 18, 2001 at [redacted] weeks gestation. She was transferring her care from [facility]. She signed her papers for bilateral tubal ligation at approximately 23-1/2 weeks at which point her Pap smear was repeated. She had a pregnancy ultrasonography on April 01, 2001 showing a single intrauterine pregnancy with [facility] consistent with previous dating of August 24, 2001. She was also treated for a yeast infection at that point. Results of her Pap smear showed HGSIL and a colposcopy was scheduled for May 27, 2001 with plans to repeat her colposcopy postpartum. Results of that Pap smear that was done with the colposcopy on May 27, 2001 showed HGSIL once more. At approximately 29-1/2 weeks she began having regular periods of contractions. Her cervix remained closed but fetal fibronectin was collected on June 12, 2001 which was positive, at which point she was given betamethasone and placed on bed rest, and p.o. terbutaline. Followup ultrasound at 34-1/2 weeks for decreased fetal movement as well as fetal position check showed fetus in the breech position with normal fluid. Patient reports followup ultrasound on July 23, 2001 showed fetus in vertex presentation.  OBSTETRICAL HISTORY:          She is a gravida 4, para 1-0-2-1. In 2000 at approximately [redacted] weeks gestation she had a spontaneous Ab. In May of 2001 she vaginally delivered a female infant weighing 6 pounds and 3 ounces at [redacted] weeks gestation after eight hours in labor. She had preterm labor at that pregnancy and was dilated to 2 cm at 25 weeks and received magnesium sulfate therapy several times. That infants name is Revonda Standard. In July of 2001 at approximately three weeks gestation  Dictated by:   Philipp Deputy, C.N.M. Attending Physician:  Shaune Spittle DD:  07/27/01 TD:  07/27/01 Job: [id number]

## 2011-05-11 NOTE — Discharge Summary (Signed)
[facility] of [facility]  Patient:    Gina Daniels                        MRN: [id number] Adm. Date:  [id number] Disc. Date: [id number] Attending:  Antionette Char Dictator:   Raynelle Jan, M.D.                           Discharge Summary  ADDENDUM  INTERIM HISTORY:              On February 4, just before the patients discharge, the OB resident oncall was called to see the patient as she began to have contractions.  She was given Terbutaline with no resolution of the contractions and was therefore placed on IV magnesium overnight which was titrated up to 4 grams per hour prior to the contractions ceasing.  Repeat cervical examination noted that her cervix was still closed, thick, and high.  Over the course of the night, after about 8 p.m. and falling asleep, the patient had no further uterine contractions and on review of her strips in the a.m., it was noted that the patients contractions were not of a routine uterine nature, but were consistent more with abdominal tensing.  It was also noted that the patient never had contractions when she was asleep.  On the day of discharge, her magnesium was turned off.  Her vital signs were stable.  Her lungs were clear.  Her cardiovascular examination was consistent with a 2/6 systolic murmur.  Otherwise, she had a regular rate and rhythm.  Her abdomen was gravid and appropriate for gestational age with no uterine tenderness and positive bowel sounds.  She had 1 to 2+ patellar reflexes. After a discussion with the patient it was deemed that she was stable to go home with follow-up at the [facility] next week.  She was told to rest for the next one to two days and to abstain from sexual intercourse over the next week.  DISCHARGE MEDICATIONS:        Prenatal vitamins.  DISCHARGE DIET:               Regular diet. DD:  01/29/00 TD:  01/29/00 Job: [id number]

## 2011-05-11 NOTE — Discharge Summary (Signed)
[place]  Patient:    Gina Daniels, Gina Daniels                       MRN: [id number] Adm. Date:  [id number] Attending:  Michaelle Copas Dictator:   Birdena Jubilee, M.D.                           Discharge Summary  DISCHARGE DIAGNOSES: 1. Intrauterine pregnancy at 3-2/7 weeks. 2. Preterm contractions.  DISCHARGE MEDICATIONS: 1. Procardia XL 60 mg one tab p.o. q.d. 2. Polytrim eye drops one drop each eye t.i.d. x 4 days. 3. Naphcon-A one to two drops each eye t.i.d. x 4 days.  DISCHARGE INSTRUCTIONS:  The patient was sent home with bedrest, with bathroom privileges.  HOSPITAL FOLLOW-UP:  The patient has to follow-up at high risk clinic on Thursday, March 28, 2000.  HISTORY OF PRESENT ILLNESS:  Ms. Gina Daniels is a 30 year old GII, P0/0/I/0, at 32-5/7 weeks, by a 14-week ultrasound, who presented to maternity admissions, complaining of contractions.  She also had diarrhea and vomiting that had been going on for one week.  She reported good fetal movement at that time.  She had noted a mild white vaginal discharge.  She denied any bleeding.  The patient received terbutaline subcu and IV fluids on maternity admission.  She continued to contract and was admitted for further monitoring.  HOSPITAL COURSE: 1. Preterm labor:  The patient was felt to have a cervicitis and initially Cefotan was started.  She also received two doses of betamethasone during the hospitalization.  On hospital day 2, the Cefotan was discontinued because of patients report that she had an ALLERGY to PENICILLIN and also had developed C.  difficile with previous antibiotic therapy.  The patients cultures came back negative for Gonorrhea, Chlamydia and group-B strep.  The patient continued to ave contractions and magnesium was started; this was continued for several days and her contractions decreased.  She was then changed to Procardia XL 60 mg and remained quiet.  The  patient had several cervical checks and has never been felt to change her cervix.  On hospital day 5, she was felt to be stable on the Procardia and t was felt she could be discharged to home with follow-up at the high risk clinic. 2. Conjunctivitis:  On the day prior to discharge, the patient was noted to have inflamed conjunctivae and injected sclerae.  A mild amount of purulent discharge was noted.  She was started on Polytrim and Naphcon-A and was sent home with these medications, to complete a five-day course. 3. Social:  Apparently, the patient has had numerous admissions for preterm labor. She was seen by case management during this hospitalization.  DISPOSITION:  The patient was discharged to home, with instructions for preterm  labor; she is also to follow-up at the high-risk clinic in four days. DD:  03/24/00 TD:  03/24/00 Job: [id number]

## 2013-01-14 ENCOUNTER — Encounter (HOSPITAL_COMMUNITY): Payer: Self-pay

## 2013-01-14 ENCOUNTER — Emergency Department (HOSPITAL_COMMUNITY)
Admission: EM | Admit: 2013-01-14 | Discharge: 2013-01-14 | Disposition: A | Discharge status: Home / Self Care | Payer: Self-pay | Attending: Emergency Medicine | Admitting: Emergency Medicine

## 2013-01-14 ENCOUNTER — Emergency Department (HOSPITAL_COMMUNITY): Payer: Self-pay

## 2013-01-14 DIAGNOSIS — I10 Essential (primary) hypertension: Secondary | ICD-10-CM

## 2013-01-14 DIAGNOSIS — Y929 Unspecified place or not applicable: Secondary | ICD-10-CM | POA: Insufficient documentation

## 2013-01-14 DIAGNOSIS — X500XXA Overexertion from strenuous movement or load, initial encounter: Secondary | ICD-10-CM | POA: Insufficient documentation

## 2013-01-14 DIAGNOSIS — S46919A Strain of unspecified muscle, fascia and tendon at shoulder and upper arm level, unspecified arm, initial encounter: Secondary | ICD-10-CM

## 2013-01-14 DIAGNOSIS — S43499A Other sprain of unspecified shoulder joint, initial encounter: Secondary | ICD-10-CM | POA: Insufficient documentation

## 2013-01-14 DIAGNOSIS — F17200 Nicotine dependence, unspecified, uncomplicated: Secondary | ICD-10-CM | POA: Insufficient documentation

## 2013-01-14 DIAGNOSIS — Y9389 Activity, other specified: Secondary | ICD-10-CM | POA: Insufficient documentation

## 2013-01-14 MED ORDER — CYCLOBENZAPRINE HCL 10 MG PO TABS: 10.0000 mg | ORAL_TABLET | Freq: Three times a day (TID) | ORAL | Status: DC | PRN

## 2013-01-14 MED ORDER — IBUPROFEN 400 MG PO TABS
800.0000 mg | ORAL_TABLET | Freq: Once | ORAL | Status: AC
  Administered 2013-01-14: 800 mg via ORAL
  Filled 2013-01-14: qty 2

## 2013-01-14 MED ORDER — OXYCODONE-ACETAMINOPHEN 5-325 MG PO TABS
1.0000 | ORAL_TABLET | Freq: Once | ORAL | Status: AC
  Administered 2013-01-14: 1 via ORAL
  Filled 2013-01-14: qty 1

## 2013-01-14 MED ORDER — NAPROXEN 500 MG PO TABS: 500.0000 mg | ORAL_TABLET | Freq: Two times a day (BID) | ORAL | Status: DC

## 2013-01-14 MED ORDER — PERCOCET 5-325 MG PO TABS: 1.0000 | ORAL_TABLET | Freq: Four times a day (QID) | ORAL | Status: DC | PRN

## 2013-01-14 NOTE — ED Notes (Signed)
Patient transported to X-ray 

## 2013-01-14 NOTE — ED Notes (Signed)
Pt. Got up off the floor last night and now is having lt. Lateral neck pain and  Muscle pain near her lt. Scapula.  No deformity noted.

## 2013-01-14 NOTE — ED Provider Notes (Signed)
History     CSN: [id number]  Arrival date & time 01/14/13  1107   First MD Initiated Contact with Patient 01/14/13 1236      Chief Complaint  Patient presents with  . Shoulder Injury    (Consider location/radiation/quality/duration/timing/severity/associated sxs/prior treatment) HPI Comments: Patient is a 32 y/o female who presents to the ED c/o L shoulder pain.   Patient states that she was getting up off the floor last night when she heard a pop and felt a sharp pain.  Patient states today the pain is a dull, throbbing pain which radiates down her arm and her neck.  Pain is rated a 7/10.  Patient denies any trauma or injuries to the shoulder.   Patient played tennis when she was younger.    Patient is a 32 y.o. female presenting with shoulder injury. The history is provided by the patient. No language interpreter was used.  Shoulder Injury Associated symptoms include arthralgias and joint swelling.    History reviewed. No pertinent past medical history.  History reviewed. No pertinent past surgical history.  No family history on file.  History  Substance Use Topics  . Smoking status: Current Every Day Smoker  . Smokeless tobacco: Not on file  . Alcohol Use: Yes    OB History    Grav Para Term Preterm Abortions TAB SAB Ect Mult Living                  Review of Systems  Musculoskeletal: Positive for joint swelling and arthralgias.  Skin: Negative for color change.  All other systems reviewed and are negative.    Allergies  Latex; Penicillins; Codeine; Sulfa antibiotics; and Tape  Home Medications   Current Outpatient Rx  Name  Route  Sig  Dispense  Refill  . BC HEADACHE POWDER PO   Oral   Take 1 packet by mouth every 6 (six) hours as needed. For pain           BP 169/103  Pulse 105  Temp 98.3 F (36.8 C) (Oral)  Resp 18  SpO2 97%  LMP 01/10/2013  Physical Exam  Nursing note and vitals reviewed. Constitutional: She is oriented to person, place,  and time. She appears well-developed and well-nourished. No distress.  HENT:  Head: Normocephalic and atraumatic.  Eyes: Conjunctivae normal are normal. Right eye exhibits no discharge. Left eye exhibits no discharge. No scleral icterus.  Neck: Normal range of motion.  Cardiovascular: Normal rate, regular rhythm and normal heart sounds.  Exam reveals no gallop and no friction rub.   No murmur heard. Pulses:      Radial pulses are 2+ on the right side, and 2+ on the left side.  Pulmonary/Chest: Effort normal and breath sounds normal. No respiratory distress. She has no wheezes. She has no rales. She exhibits no tenderness.  Musculoskeletal:       Left shoulder: She exhibits tenderness and pain.       Mild swelling of the L shoulder with no erythema or warmth.  Tenderness to palpation of the trapezius, distal portion of the clavicle, AC joint, and posterior humoral head.  Full ROM of fingers, wrist, and elbow.  Shoulder ROM is limited due to pain, but patient has active internal and external rotation.      Neurological: She is alert and oriented to person, place, and time.       Motor and sensory portion of median, ulnar, and radial nerve intact.   Skin: Skin is warm  and dry. She is not diaphoretic. No erythema.  Psychiatric: She has a normal mood and affect. Her behavior is normal.    ED Course  Procedures (including critical care time)  Labs Reviewed - No data to display Dg Shoulder Left  01/14/2013  *RADIOLOGY REPORT*  Clinical Data: Left shoulder pain and limited range of motion.  LEFT SHOULDER - 2+ VIEW  Comparison: None.  Findings: There is no fracture, dislocation, soft tissue calcification, or other abnormality.  IMPRESSION: Normal exam.   Original Report Authenticated By: Francene Boyers, M.D.      No diagnosis found.    MDM  Hypertensive, Left shoulder pain Patient X-Ray negative for obvious fracture or dislocation. Pain managed in ED. Pt advised to follow up with orthopedics  if symptoms persist for possibility of missed fracture diagnosis.Conservative therapy recommended and discussed. Patient will be dc home & is agreeable with above plan.         Jaci Carrel, [place] 01/14/13 1431

## 2013-01-14 NOTE — ED Provider Notes (Signed)
[address]  [place] [address]   Gina Daniels. Rubin Payor, MD 01/14/13 [phone]

## 2013-04-28 ENCOUNTER — Encounter (HOSPITAL_COMMUNITY): Payer: Self-pay | Admitting: Adult Health

## 2013-04-28 ENCOUNTER — Emergency Department (HOSPITAL_COMMUNITY)
Admission: EM | Admit: 2013-04-28 | Discharge: 2013-04-28 | Discharge status: Against Medical Advice | Payer: BC Managed Care – PPO | Attending: Emergency Medicine | Admitting: Emergency Medicine

## 2013-04-28 DIAGNOSIS — R112 Nausea with vomiting, unspecified: Secondary | ICD-10-CM | POA: Insufficient documentation

## 2013-04-28 DIAGNOSIS — R109 Unspecified abdominal pain: Secondary | ICD-10-CM | POA: Insufficient documentation

## 2013-04-28 DIAGNOSIS — Z8719 Personal history of other diseases of the digestive system: Secondary | ICD-10-CM | POA: Insufficient documentation

## 2013-04-28 HISTORY — DX: Calculus of gallbladder without cholecystitis without obstruction: K80.20

## 2013-04-28 LAB — COMPREHENSIVE METABOLIC PANEL
BUN: 5 mg/dL — ABNORMAL LOW (ref 6–23)
CO2: 22 mEq/L (ref 19–32)
Chloride: 99 mEq/L (ref 96–112)
Creatinine, Ser: 0.63 mg/dL (ref 0.50–1.10)
GFR calc Af Amer: >90 mL/min (ref >90)
GFR calc non Af Amer: >90 mL/min (ref >90)
Total Bilirubin: 1 mg/dL (ref 0.3–1.2)

## 2013-04-28 LAB — CBC WITH DIFFERENTIAL/PLATELET
Eosinophils Absolute: 0 10*3/uL (ref 0.0–0.7)
Lymphocytes Relative: 13 % (ref 12–46)
Lymphs Abs: 2.2 10*3/uL (ref 0.7–4.0)
Neutrophils Relative %: 79 % — ABNORMAL HIGH (ref 43–77)
Platelets: 225 10*3/uL (ref 150–400)
RBC: 5.53 MIL/uL — ABNORMAL HIGH (ref 3.87–5.11)
WBC: 17.5 10*3/uL — ABNORMAL HIGH (ref 4.0–10.5)

## 2013-04-28 MED ORDER — ONDANSETRON 4 MG PO TBDP
8.0000 mg | ORAL_TABLET | Freq: Once | ORAL | Status: AC
  Administered 2013-04-28: 8 mg via ORAL

## 2013-04-28 MED ORDER — ONDANSETRON 4 MG PO TBDP
ORAL_TABLET | ORAL | Status: AC
  Filled 2013-04-28: qty 2

## 2013-04-28 NOTE — ED Notes (Signed)
NURSE FIRST ROUNDS : UNABLE TO LOCATE PT. AT TRIAGE / WAITING AREA SEVERAL TIMES.

## 2013-04-28 NOTE — ED Notes (Addendum)
Presents with emesis since 3pm today, nausea, and right sided abdominal pain. Pain began suddenly, Hx of gallstones, states it feels the same.  Pt is dry heaving at triage.

## 2013-11-26 ENCOUNTER — Encounter: Payer: BC Managed Care – PPO | Attending: Nurse Practitioner | Admitting: *Deleted

## 2013-11-26 ENCOUNTER — Encounter: Payer: Self-pay | Admitting: *Deleted

## 2013-11-26 VITALS — Ht 65.0 in | Wt 111.6 lb

## 2013-11-26 DIAGNOSIS — E440 Moderate protein-calorie malnutrition: Secondary | ICD-10-CM | POA: Insufficient documentation

## 2013-11-26 DIAGNOSIS — Z713 Dietary counseling and surveillance: Secondary | ICD-10-CM | POA: Insufficient documentation

## 2013-11-26 DIAGNOSIS — R638 Other symptoms and signs concerning food and fluid intake: Secondary | ICD-10-CM | POA: Insufficient documentation

## 2013-11-26 DIAGNOSIS — F5000 Anorexia nervosa, unspecified: Secondary | ICD-10-CM | POA: Insufficient documentation
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
## 2013-12-03 ENCOUNTER — Encounter: Payer: BC Managed Care – PPO | Admitting: *Deleted

## 2013-12-03 VITALS — Wt 110.0 lb

## 2013-12-03 DIAGNOSIS — F5000 Anorexia nervosa, unspecified: Secondary | ICD-10-CM

## 2013-12-03 NOTE — Patient Instructions (Addendum)
Aim for 3 meals and 1 snack each day  Snack ideas:  Sugar-free pudding made with milk.   craisins with spoonful peanut butter Apple or banana with peanut butter Cottage cheese trail mix or chex mix with nuts hard boiled egg/ egg salad Tuna salad Cheese Glass of milk  Carnation instant breakfast or protein powder in milk-   Meals must contain protein: hard boiled egg, burger, tuna, cheese, chicken, peanut butter,cottage cheese: meat or dairy or nut product
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
## 2013-12-10 ENCOUNTER — Encounter: Payer: BC Managed Care – PPO | Admitting: *Deleted

## 2013-12-10 DIAGNOSIS — F5000 Anorexia nervosa, unspecified: Secondary | ICD-10-CM

## 2013-12-10 DIAGNOSIS — E440 Moderate protein-calorie malnutrition: Secondary | ICD-10-CM
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
## 2013-12-10 NOTE — Patient Instructions (Signed)
Limit beer to 3/day Limit milk to 24 oz/day Limit water to 3 bottles/day  Bread and butter Broccoli and cheese soup- make Luisa Hart make it for you Spaghetti sauce over zoodles  No more zero food days

## 2013-12-25 ENCOUNTER — Ambulatory Visit: Payer: BC Managed Care – PPO | Admitting: *Deleted

## 2013-12-31 ENCOUNTER — Encounter: Payer: BC Managed Care – PPO | Attending: Nurse Practitioner | Admitting: *Deleted

## 2013-12-31 VITALS — Ht 65.0 in | Wt 118.2 lb

## 2013-12-31 DIAGNOSIS — F5000 Anorexia nervosa, unspecified: Secondary | ICD-10-CM | POA: Insufficient documentation

## 2013-12-31 DIAGNOSIS — Z713 Dietary counseling and surveillance: Secondary | ICD-10-CM | POA: Insufficient documentation

## 2013-12-31 DIAGNOSIS — R638 Other symptoms and signs concerning food and fluid intake: Secondary | ICD-10-CM | POA: Insufficient documentation

## 2013-12-31 DIAGNOSIS — F509 Eating disorder, unspecified: Secondary | ICD-10-CM
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
## 2014-01-07 ENCOUNTER — Encounter: Payer: BC Managed Care – PPO | Admitting: *Deleted

## 2014-01-07 ENCOUNTER — Encounter: Payer: Self-pay | Admitting: *Deleted

## 2014-01-07 VITALS — Ht 65.0 in | Wt 119.2 lb

## 2014-01-07 DIAGNOSIS — F5000 Anorexia nervosa, unspecified: Secondary | ICD-10-CM
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
## 2014-01-14 ENCOUNTER — Encounter: Payer: BC Managed Care – PPO | Admitting: *Deleted

## 2014-01-14 VITALS — Ht 65.0 in | Wt 117.4 lb

## 2014-01-14 DIAGNOSIS — F5000 Anorexia nervosa, unspecified: Secondary | ICD-10-CM

## 2014-01-14 DIAGNOSIS — R638 Other symptoms and signs concerning food and fluid intake: Secondary | ICD-10-CM
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
## 2014-01-18 ENCOUNTER — Encounter: Payer: BC Managed Care – PPO | Admitting: *Deleted

## 2014-01-18 DIAGNOSIS — F5000 Anorexia nervosa, unspecified: Secondary | ICD-10-CM
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
## 2014-01-21 ENCOUNTER — Ambulatory Visit: Payer: BC Managed Care – PPO | Admitting: *Deleted

## 2014-01-28 ENCOUNTER — Ambulatory Visit: Payer: BC Managed Care – PPO | Admitting: *Deleted

## 2014-02-11 ENCOUNTER — Ambulatory Visit: Payer: BC Managed Care – PPO | Admitting: *Deleted

## 2014-02-18 ENCOUNTER — Ambulatory Visit: Payer: BC Managed Care – PPO | Admitting: *Deleted

## 2014-02-25 ENCOUNTER — Ambulatory Visit: Payer: BC Managed Care – PPO | Admitting: *Deleted

## 2014-03-02 ENCOUNTER — Encounter: Payer: BC Managed Care – PPO | Attending: Nurse Practitioner | Admitting: *Deleted

## 2014-03-02 ENCOUNTER — Encounter: Payer: Self-pay | Admitting: *Deleted

## 2014-03-02 VITALS — Ht 65.0 in | Wt 125.4 lb

## 2014-03-02 DIAGNOSIS — Z713 Dietary counseling and surveillance: Secondary | ICD-10-CM | POA: Insufficient documentation

## 2014-03-02 DIAGNOSIS — F5000 Anorexia nervosa, unspecified: Secondary | ICD-10-CM

## 2014-03-02 DIAGNOSIS — E440 Moderate protein-calorie malnutrition: Secondary | ICD-10-CM | POA: Insufficient documentation

## 2014-03-02 DIAGNOSIS — R638 Other symptoms and signs concerning food and fluid intake: Secondary | ICD-10-CM | POA: Insufficient documentation

## 2014-03-02 NOTE — Patient Instructions (Signed)
Set reminder on your phone to take your trazadone.  9:30 pm That way you will have a good night's sleep and wont' need as much soda the next day.  And you won't need to drink as much beer  Aim for 3 meals each day B: granola bar or crackers or cottage cheese L: hot pocket or mac-n-cheese or frozen pizza or frozen meal or whatever D: whatever patrick cooks Continue to drink plenty of water  Aim for 2-3 cups of soda/day Aim for 3-4 beers/day
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
## 2014-03-19 ENCOUNTER — Ambulatory Visit: Payer: BC Managed Care – PPO | Admitting: *Deleted

## 2014-03-25 ENCOUNTER — Ambulatory Visit: Payer: BC Managed Care – PPO | Admitting: *Deleted

## 2014-04-01 ENCOUNTER — Ambulatory Visit: Payer: BC Managed Care – PPO | Admitting: *Deleted

## 2014-04-08 ENCOUNTER — Encounter: Payer: BC Managed Care – PPO | Attending: Nurse Practitioner | Admitting: *Deleted

## 2014-04-08 VITALS — Wt 126.7 lb

## 2014-04-08 DIAGNOSIS — R638 Other symptoms and signs concerning food and fluid intake: Secondary | ICD-10-CM | POA: Insufficient documentation

## 2014-04-08 DIAGNOSIS — F5000 Anorexia nervosa, unspecified: Secondary | ICD-10-CM | POA: Insufficient documentation

## 2014-04-08 DIAGNOSIS — Z713 Dietary counseling and surveillance: Secondary | ICD-10-CM | POA: Insufficient documentation

## 2014-04-08 DIAGNOSIS — E440 Moderate protein-calorie malnutrition: Secondary | ICD-10-CM | POA: Insufficient documentation
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
## 2014-04-09 ENCOUNTER — Encounter (HOSPITAL_COMMUNITY): Payer: Self-pay | Admitting: Emergency Medicine

## 2014-04-09 ENCOUNTER — Emergency Department (HOSPITAL_COMMUNITY)
Admission: EM | Admit: 2014-04-09 | Discharge: 2014-04-09 | Disposition: A | Discharge status: Home / Self Care | Payer: BC Managed Care – PPO | Attending: Emergency Medicine | Admitting: Emergency Medicine

## 2014-04-09 ENCOUNTER — Emergency Department (HOSPITAL_COMMUNITY): Payer: BC Managed Care – PPO

## 2014-04-09 ENCOUNTER — Emergency Department (HOSPITAL_COMMUNITY)
Admission: EM | Admit: 2014-04-09 | Discharge: 2014-04-09 | Disposition: A | Discharge status: Home / Self Care | Payer: Self-pay

## 2014-04-09 DIAGNOSIS — F17200 Nicotine dependence, unspecified, uncomplicated: Secondary | ICD-10-CM | POA: Insufficient documentation

## 2014-04-09 DIAGNOSIS — Z91040 Latex allergy status: Secondary | ICD-10-CM | POA: Insufficient documentation

## 2014-04-09 DIAGNOSIS — Z8719 Personal history of other diseases of the digestive system: Secondary | ICD-10-CM | POA: Insufficient documentation

## 2014-04-09 DIAGNOSIS — Y9389 Activity, other specified: Secondary | ICD-10-CM | POA: Insufficient documentation

## 2014-04-09 DIAGNOSIS — L03119 Cellulitis of unspecified part of limb: Secondary | ICD-10-CM

## 2014-04-09 DIAGNOSIS — Z880 Allergy status to penicillin: Secondary | ICD-10-CM | POA: Insufficient documentation

## 2014-04-09 DIAGNOSIS — Z23 Encounter for immunization: Secondary | ICD-10-CM | POA: Insufficient documentation

## 2014-04-09 DIAGNOSIS — L0390 Cellulitis, unspecified: Secondary | ICD-10-CM

## 2014-04-09 DIAGNOSIS — L02519 Cutaneous abscess of unspecified hand: Secondary | ICD-10-CM | POA: Insufficient documentation

## 2014-04-09 DIAGNOSIS — W540XXA Bitten by dog, initial encounter: Secondary | ICD-10-CM | POA: Insufficient documentation

## 2014-04-09 DIAGNOSIS — Z79899 Other long term (current) drug therapy: Secondary | ICD-10-CM | POA: Insufficient documentation

## 2014-04-09 DIAGNOSIS — Y9289 Other specified places as the place of occurrence of the external cause: Secondary | ICD-10-CM | POA: Insufficient documentation

## 2014-04-09 DIAGNOSIS — S61509A Unspecified open wound of unspecified wrist, initial encounter: Secondary | ICD-10-CM | POA: Insufficient documentation

## 2014-04-09 LAB — CBC WITH DIFFERENTIAL/PLATELET
Basophils Absolute: 0 K/uL (ref 0.0–0.1)
Basophils Relative: 0 % (ref 0–1)
Eosinophils Absolute: 0.2 K/uL (ref 0.0–0.7)
Eosinophils Relative: 2 % (ref 0–5)
HCT: 42.6 % (ref 36.0–46.0)
Hemoglobin: 15.2 g/dL — ABNORMAL HIGH (ref 12.0–15.0)
Lymphocytes Relative: 19 % (ref 12–46)
Lymphs Abs: 2.1 K/uL (ref 0.7–4.0)
MCH: 36.5 pg — ABNORMAL HIGH (ref 26.0–34.0)
MCHC: 35.7 g/dL (ref 30.0–36.0)
MCV: 102.4 fL — ABNORMAL HIGH (ref 78.0–100.0)
Monocytes Absolute: 1.5 K/uL — ABNORMAL HIGH (ref 0.1–1.0)
Monocytes Relative: 13 % — ABNORMAL HIGH (ref 3–12)
Neutro Abs: 7.4 K/uL (ref 1.7–7.7)
Neutrophils Relative %: 66 % (ref 43–77)
Platelets: 194 K/uL (ref 150–400)
RBC: 4.16 MIL/uL (ref 3.87–5.11)
RDW: 12 % (ref 11.5–15.5)
WBC: 11.2 K/uL — ABNORMAL HIGH (ref 4.0–10.5)

## 2014-04-09 LAB — BASIC METABOLIC PANEL
BUN: 11 mg/dL (ref 6–23)
CALCIUM: 9.6 mg/dL (ref 8.4–10.5)
CO2: 29 mEq/L (ref 19–32)
Chloride: 95 mEq/L — ABNORMAL LOW (ref 96–112)
Creatinine, Ser: 0.72 mg/dL (ref 0.50–1.10)
Glucose, Bld: 91 mg/dL (ref 70–99)
Potassium: 4.1 mEq/L (ref 3.7–5.3)
SODIUM: 137 meq/L (ref 137–147)

## 2014-04-09 MED ORDER — RABIES IMMUNE GLOBULIN 150 UNIT/ML IM INJ
20.0000 [IU]/kg | INJECTION | Freq: Once | INTRAMUSCULAR | Status: AC
  Administered 2014-04-09: 1125 [IU]
  Filled 2014-04-09: qty 8

## 2014-04-09 MED ORDER — RABIES VACCINE, PCEC IM SUSR
1.0000 mL | Freq: Once | INTRAMUSCULAR | Status: AC
  Administered 2014-04-09: 1 mL via INTRAMUSCULAR
  Filled 2014-04-09: qty 1

## 2014-04-09 MED ORDER — CLINDAMYCIN PHOSPHATE 600 MG/50ML IV SOLN
600.0000 mg | Freq: Once | INTRAVENOUS | Status: AC
  Administered 2014-04-09: 600 mg via INTRAVENOUS
  Filled 2014-04-09: qty 50

## 2014-04-09 MED ORDER — ONDANSETRON HCL 4 MG PO TABS: 4.0000 mg | ORAL_TABLET | Freq: Four times a day (QID) | ORAL | Status: DC

## 2014-04-09 MED ORDER — OXYCODONE-ACETAMINOPHEN 5-325 MG PO TABS
2.0000 | ORAL_TABLET | ORAL | Status: DC | PRN
Start: 2014-04-09 — End: 2014-09-01

## 2014-04-09 MED ORDER — CLINDAMYCIN HCL 150 MG PO CAPS: 300.0000 mg | ORAL_CAPSULE | Freq: Three times a day (TID) | ORAL | Status: DC

## 2014-04-09 MED ORDER — RABIES VACCINE, PCEC IM SUSR: 1.0000 mL | Freq: Once | INTRAMUSCULAR | Status: DC

## 2014-04-09 NOTE — Discharge Instructions (Signed)
Animal Bite °An animal bite can result in a scratch on the skin, deep open cut, puncture of the skin, crush injury, or tearing away of the skin or a body part. Dogs are responsible for most animal bites. Children are bitten more often than adults. An animal bite can range from very mild to more serious. A small bite from your house pet is no cause for alarm. However, some animal bites can become infected or injure a bone or other tissue. You must seek medical care if: °· The skin is broken and bleeding does not slow down or stop after 15 minutes. °· The puncture is deep and difficult to clean (such as a cat bite). °· Pain, warmth, redness, or pus develops around the wound. °· The bite is from a stray animal or rodent. There may be a risk of rabies infection. °· The bite is from a snake, raccoon, skunk, fox, coyote, or bat. There may be a risk of rabies infection. °· The person bitten has a chronic illness such as diabetes, liver disease, or cancer, or the person takes medicine that lowers the immune system. °· There is concern about the location and severity of the bite. °It is important to clean and protect an animal bite wound right away to prevent infection. Follow these steps: °· Clean the wound with plenty of water and soap. °· Apply an antibiotic cream. °· Apply gentle pressure over the wound with a clean towel or gauze to slow or stop bleeding. °· Elevate the affected area above the heart to help stop any bleeding. °· Seek medical care. Getting medical care within 8 hours of the animal bite leads to the best possible outcome. °DIAGNOSIS  °Your caregiver will most likely: °· Take a detailed history of the animal and the bite injury. °· Perform a wound exam. °· Take your medical history. °Blood tests or X-rays may be performed. Sometimes, infected bite wounds are cultured and sent to a lab to identify the infectious bacteria.  °TREATMENT  °Medical treatment will depend on the location and type of animal bite as  well as the patient's medical history. Treatment may include: °· Wound care, such as cleaning and flushing the wound with saline solution, bandaging, and elevating the affected area. °· Antibiotics. °· Tetanus immunization. °· Rabies immunization. °· Leaving the wound open to heal. This is often done with animal bites, due to the high risk of infection. However, in certain cases, wound closure with stitches, wound adhesive, skin adhesive strips, or staples may be used. ° Infected bites that are left untreated may require intravenous (IV) antibiotics and surgical treatment in the hospital. °HOME CARE INSTRUCTIONS °· Follow your caregiver's instructions for wound care. °· Take all medicines as directed. °· If your caregiver prescribes antibiotics, take them as directed. Finish them even if you start to feel better. °· Follow up with your caregiver for further exams or immunizations as directed. °You may need a tetanus shot if: °· You cannot remember when you had your last tetanus shot. °· You have never had a tetanus shot. °· The injury broke your skin. °If you get a tetanus shot, your arm may swell, get red, and feel warm to the touch. This is common and not a problem. If you need a tetanus shot and you choose not to have one, there is a rare chance of getting tetanus. Sickness from tetanus can be serious. °SEEK MEDICAL CARE IF: °· You notice warmth, redness, soreness, swelling, pus discharge, or a bad   smell coming from the wound.  You have a red line on the skin coming from the wound.  You have a fever, chills, or a general ill feeling.  You have nausea or vomiting.  You have continued or worsening pain.  You have trouble moving the injured part.  You have other questions or concerns. MAKE SURE YOU:  Understand these instructions.  Will watch your condition.  Will get help right away if you are not doing well or get worse. Document Released: 08/28/2011 Document Revised: 03/03/2012 Document  Reviewed: 08/28/2011 [facility] Patient Information 2014 [employer], [place].  Cellulitis Cellulitis is an infection of the skin and the tissue under the skin. The infected area is usually red and tender. This happens most often in the arms and lower legs. HOME CARE   Take your antibiotic medicine as told. Finish the medicine even if you start to feel better.  Keep the infected arm or leg raised (elevated).  Put a warm cloth on the area up to 4 times per day.  Only take medicines as told by your doctor.  Keep all doctor visits as told. GET HELP RIGHT AWAY IF:   You have a fever.  You feel very sleepy.  You throw up (vomit) or have watery poop (diarrhea).  You feel sick and have muscle aches and pains.  You see red streaks on the skin coming from the infected area.  Your red area gets bigger or turns a dark color.  Your bone or joint under the infected area is painful after the skin heals.  Your infection comes back in the same area or different area.  You have a puffy (swollen) bump in the infected area.  You have new symptoms. MAKE SURE YOU:   Understand these instructions.  Will watch your condition.  Will get help right away if you are not doing well or get worse. Document Released: 05/28/2008 Document Revised: 06/10/2012 Document Reviewed: 02/25/2012 [facility] Patient Information 2014 [place], [place].

## 2014-04-09 NOTE — ED Provider Notes (Signed)
CSN: [id number]     Arrival date & time 04/09/14  1337 History  This chart was scribed for non-physician practitioner Junious SilkHannah Skie Vitrano, PA-C working with Shelda JakesScott W. Zackowski, MD by Joaquin MusicKristina Sanchez-Matthews, ED Scribe. This patient was seen in room TR11C/TR11C and the patient's care was started at 5:22 PM .   Chief Complaint  Patient presents with  . Animal Bite   The history is provided by the patient. No language interpreter was used.   HPI Comments: Gina Daniels is a 33 y.o. female who presents to the Emergency Department complaining of animal bite that occurred yesterday evening. Pt states she went outdoors to feed her cat, but states she was bit by a stray dog whom ran up her and bit her R hand. She states she saw the dog had an UTD rabies collar but states she is unfamiliar with the dog. She is concerned that the rabies vaccine is not up to date as she has had many experiences with people that switch dog collars and do not vaccinate their dogs. She denies turning the dog in to animal control  And states the dog ran away. She reports having tingling sensation to R hand fingers and limited R wrist pain. She reports taking a Tramadol PTA and denies having relief. Pt denies any other injuries.  Past Medical History  Diagnosis Date  . Gallstones    Past Surgical History  Procedure Laterality Date  . Tubal ligation  2002  . Wisdom tooth extraction  2002   Family History  Problem Relation Age of Onset  . Hypertension Mother   . Hypertension Father   . Heart attack Father   . Diabetes Maternal Grandmother    History  Substance Use Topics  . Smoking status: Current Every Day Smoker -- 0.50 packs/day for 21 years    Types: Cigarettes  . Smokeless tobacco: Not on file  . Alcohol Use: Yes   OB History   Grav Para Term Preterm Abortions TAB SAB Ect Mult Living                 Review of Systems  Skin: Positive for color change and wound.  All other systems reviewed and are  negative.  Allergies  Latex; Penicillins; Codeine; Sulfa antibiotics; and Tape  Home Medications   Prior to Admission medications   Medication Sig Start Date End Date Taking? Authorizing Provider  ALPRAZolam Prudy Feeler(XANAX) 0.5 MG tablet Take 0.5 mg by mouth as needed for anxiety.     Historical Provider, MD  amphetamine-dextroamphetamine (ADDERALL XR) 25 MG 24 hr capsule Take 25 mg by mouth every morning.    Historical Provider, MD  Aspirin-Salicylamide-Caffeine (BC HEADACHE POWDER PO) Take 1 packet by mouth every 6 (six) hours as needed. For pain    Historical Provider, MD  buPROPion (ZYBAN) 150 MG 12 hr tablet Take 150 mg by mouth 2 (two) times daily.    Historical Provider, MD  calcium carbonate 200 MG capsule Take 250 mg by mouth 2 (two) times daily with a meal.    Historical Provider, MD  citalopram (CELEXA) 20 MG tablet Take 40 mg by mouth daily.     Historical Provider, MD  hydrochlorothiazide (HYDRODIURIL) 25 MG tablet Take 25 mg by mouth daily.    Historical Provider, MD  lamoTRIgine (LAMICTAL) 25 MG tablet Take 100 mg by mouth daily.     Historical Provider, MD  Multiple Vitamin (MULTIVITAMIN) tablet Take 1 tablet by mouth daily.    Historical Provider, MD  traZODone (DESYREL) 50 MG tablet Take 50 mg by mouth at bedtime.    Historical Provider, MD  valsartan-hydrochlorothiazide (DIOVAN-HCT) 160-25 MG per tablet Take 1 tablet by mouth daily.    Historical Provider, MD  Vitamin D, Ergocalciferol, (DRISDOL) 50000 UNITS CAPS capsule Take 50,000 Units by mouth every 7 (seven) days.    Historical Provider, MD   BP 124/87  Pulse 97  Temp(Src) 98.6 F (37 C) (Oral)  Resp 18  Wt 126 lb 12.2 oz (57.5 kg)  SpO2 98%  LMP 04/04/2014  Physical Exam  Nursing note and vitals reviewed. Constitutional: She is oriented to person, place, and time. She appears well-developed and well-nourished. No distress.  HENT:  Head: Normocephalic and atraumatic.  Right Ear: External ear normal.  Left Ear:  External ear normal.  Nose: Nose normal.  Mouth/Throat: Oropharynx is clear and moist.  Eyes: Conjunctivae and EOM are normal.  Neck: Normal range of motion. Neck supple. No tracheal deviation present.  Cardiovascular: Normal rate, regular rhythm and normal heart sounds.   Capillary refill < 3 seconds in all fingers.   Pulmonary/Chest: Effort normal and breath sounds normal. No stridor. No respiratory distress. She has no wheezes. She has no rales.  Abdominal: Soft. She exhibits no distension.  Musculoskeletal: Normal range of motion.  Patient is able to flex and extend hand and wrist, but with pain Compartment soft.   Neurological: She is alert and oriented to person, place, and time. She has normal strength.  Neurovascularly intact  Skin: Skin is warm and dry. She is not diaphoretic. No erythema.  Erythema over dorsal aspect of L hand with associated  swelling. ROM decreased. Blanches with palpation. Tender to palpation.  1 cm bites on radial/ulnar aspect of wrist.   Psychiatric: She has a normal mood and affect. Her behavior is normal.   ED Course  Procedures  DIAGNOSTIC STUDIES: Oxygen Saturation is 100% on RA, normal by my interpretation.    COORDINATION OF CARE: 5:27 PM-Discussed treatment plan which includes discussed radiology findings. Will begin IV antibiotics with pt while in ED and will discharge pt with antibiotics and pain medication. Pt agreed to plan.   Labs Review Labs Reviewed  CBC WITH DIFFERENTIAL - Abnormal; Notable for the following:    WBC 11.2 (*)    Hemoglobin 15.2 (*)    MCV 102.4 (*)    MCH 36.5 (*)    Monocytes Relative 13 (*)    Monocytes Absolute 1.5 (*)    All other components within normal limits  BASIC METABOLIC PANEL - Abnormal; Notable for the following:    Chloride 95 (*)    All other components within normal limits    Imaging Review Dg Wrist Complete Left  04/09/2014   CLINICAL DATA:  Dog bite to the left hand and wrist. Swelling and  pain.  EXAM: LEFT WRIST - COMPLETE 3+ VIEW  COMPARISON:  None.  FINDINGS: There is no evidence of fracture or dislocation. There is no evidence of arthropathy or other focal bone abnormality. Soft tissues are unremarkable.  IMPRESSION: No acute fracture or dislocation.   Electronically Signed   By: Sherian ReinWei-Chen  Lin M.D.   On: 04/09/2014 14:25   Dg Hand Complete Left  04/09/2014   CLINICAL DATA:  Dog bite to the left hand and wrist. Swelling and pain.  EXAM: LEFT HAND - COMPLETE 3+ VIEW  COMPARISON:  None.  FINDINGS: There is no evidence of fracture or dislocation. There is no evidence of arthropathy or other  focal bone abnormality. Soft tissues are unremarkable.  IMPRESSION: No acute fracture or dislocation.  No radiopaque foreign body.   Electronically Signed   By: Sherian Rein M.D.   On: 04/09/2014 14:26     EKG Interpretation None      MDM   Final diagnoses:  Dog bite  Cellulitis    Patient presents to ED for evaluation of dog bite yesterday. Tdap up to date. She is unsure if the dog was UTD on rabies vaccine. Will cover empirically. Patient received IV clindamycin in ED as she is anaphylactically allergic to penicillins. Will d/c home with clindamycin. Gave patient strict return instructions as well as instructed patient to return for wound recheck on Sunday at 8am. She will remain NPO after midnight on Saturday. Discussed reasons to return to the ED sooner. Vital signs stable for discharge. Discussed case with Dr. Deretha Emory who agrees with plan. Patient / Family / Caregiver informed of clinical course, understand medical decision-making process, and agree with plan.   6:25 PM Discussed case with Dr. Janee Morn of hand surgery. Come to ED at Sunday at 8am NPO after midnight.   Mora Bellman, PA-C 04/09/14 2109

## 2014-04-09 NOTE — ED Notes (Addendum)
Per pt sts she was bit by a stray dog yesterday to left hand. sts that she did call animal control. sts the dog had a tag that sts it is up to date on rabies. sts left hand pain and bleeding. Hand red and warm to touch. sts a puncture wound that she thinks is down to the muscle.

## 2014-04-11 NOTE — ED Provider Notes (Signed)
Medical screening examination/treatment/procedure(s) were performed by non-physician practitioner and as supervising physician I was immediately available for consultation/collaboration.   EKG Interpretation None        Shelda JakesScott W. Minor Iden, MD 04/11/14 1540

## 2014-04-12 ENCOUNTER — Emergency Department (HOSPITAL_COMMUNITY)
Admission: EM | Admit: 2014-04-12 | Discharge: 2014-04-12 | Disposition: A | Discharge status: Home / Self Care | Payer: BC Managed Care – PPO | Attending: Emergency Medicine | Admitting: Emergency Medicine

## 2014-04-12 ENCOUNTER — Encounter (HOSPITAL_COMMUNITY): Payer: Self-pay | Admitting: Emergency Medicine

## 2014-04-12 DIAGNOSIS — Z8719 Personal history of other diseases of the digestive system: Secondary | ICD-10-CM | POA: Insufficient documentation

## 2014-04-12 DIAGNOSIS — G8911 Acute pain due to trauma: Secondary | ICD-10-CM | POA: Insufficient documentation

## 2014-04-12 DIAGNOSIS — Z760 Encounter for issue of repeat prescription: Secondary | ICD-10-CM | POA: Insufficient documentation

## 2014-04-12 DIAGNOSIS — Z880 Allergy status to penicillin: Secondary | ICD-10-CM | POA: Insufficient documentation

## 2014-04-12 DIAGNOSIS — Z4801 Encounter for change or removal of surgical wound dressing: Secondary | ICD-10-CM | POA: Insufficient documentation

## 2014-04-12 DIAGNOSIS — T148XXA Other injury of unspecified body region, initial encounter: Secondary | ICD-10-CM

## 2014-04-12 DIAGNOSIS — F17200 Nicotine dependence, unspecified, uncomplicated: Secondary | ICD-10-CM | POA: Insufficient documentation

## 2014-04-12 DIAGNOSIS — M25539 Pain in unspecified wrist: Secondary | ICD-10-CM | POA: Insufficient documentation

## 2014-04-12 DIAGNOSIS — Z79899 Other long term (current) drug therapy: Secondary | ICD-10-CM | POA: Insufficient documentation

## 2014-04-12 DIAGNOSIS — Z23 Encounter for immunization: Secondary | ICD-10-CM | POA: Insufficient documentation

## 2014-04-12 DIAGNOSIS — Z792 Long term (current) use of antibiotics: Secondary | ICD-10-CM | POA: Insufficient documentation

## 2014-04-12 DIAGNOSIS — Z91040 Latex allergy status: Secondary | ICD-10-CM | POA: Insufficient documentation

## 2014-04-12 LAB — CBC
HCT: 42 % (ref 36.0–46.0)
Hemoglobin: 14.8 g/dL (ref 12.0–15.0)
MCH: 36.2 pg — AB (ref 26.0–34.0)
MCHC: 35.2 g/dL (ref 30.0–36.0)
MCV: 102.7 fL — ABNORMAL HIGH (ref 78.0–100.0)
Platelets: 195 10*3/uL (ref 150–400)
RBC: 4.09 MIL/uL (ref 3.87–5.11)
RDW: 11.9 % (ref 11.5–15.5)
WBC: 11.6 10*3/uL — AB (ref 4.0–10.5)

## 2014-04-12 MED ORDER — CLINDAMYCIN HCL 150 MG PO CAPS: 300.0000 mg | ORAL_CAPSULE | Freq: Three times a day (TID) | ORAL | Status: DC

## 2014-04-12 MED ORDER — CLINDAMYCIN PHOSPHATE 600 MG/50ML IV SOLN
600.0000 mg | Freq: Once | INTRAVENOUS | Status: AC
Start: 2014-04-12 — End: 2014-04-12
  Administered 2014-04-12: 600 mg via INTRAVENOUS
  Filled 2014-04-12: qty 50

## 2014-04-12 MED ORDER — RABIES VACCINE, PCEC IM SUSR
1.0000 mL | Freq: Once | INTRAMUSCULAR | Status: AC
  Administered 2014-04-12: 1 mL via INTRAMUSCULAR
  Filled 2014-04-12: qty 1

## 2014-04-12 NOTE — Discharge Instructions (Signed)
Return in 4 days for your next Rabies vaccination shot.  Call for a follow up appointment with Dr. Mina MarbleWeingold for further evaluation of your dog bite. Return if Symptoms worsen.   Take your antibiotics. Soak in warm soapy water for 20 minutes as discussed by Dr. Mina MarbleWeingold, apply a dry dressing DO NOT USE antibiotic medication.

## 2014-04-12 NOTE — ED Provider Notes (Signed)
This chart was scribed for non-physician practitioner, Mellody DrownLauren Geeta Dworkin, PA-C, working with Toy BakerAnthony T Allen, MD, by Ellin MayhewMichael Levi, ED Scribe. This patient was seen in room TR08C/TR08C and the patient's care was started at 3:04 PM.  HPI Comments: Gina Daniels is a 33 y.o. female who presents to the Emergency Department with a chief complaint of a wound check. Patient reports receiving a rabies vaccine three days ago, where was she last seen at the the ED for an animal bite. Patient was scheduled to see Dr. Janee Mornhompson yesterday and reports she accidentally fell asleep after taking percocet. Additionally, patient reports having taken medication up until yesterday, because she misplaced her antibiotics on her way out of the pharmacy. until Saturday but she lost them. Patient reports having consistent fevers of temperatures around 99 taken at home (99.7 this morning). Patient reports she is still having pain and that the pain is made worse with movement. She reports that she needs another antibiotic prescription as well as her 2nd rabies vaccine.  The history is provided by the patient. No language interpreter was used.   Past Medical History  Diagnosis Date  . Gallstones    Past Surgical History  Procedure Laterality Date  . Tubal ligation  2002  . Wisdom tooth extraction  2002   Family History  Problem Relation Age of Onset  . Hypertension Mother   . Hypertension Father   . Heart attack Father   . Diabetes Maternal Grandmother    History  Substance Use Topics  . Smoking status: Current Every Day Smoker -- 0.50 packs/day for 21 years    Types: Cigarettes  . Smokeless tobacco: Not on file  . Alcohol Use: Yes   OB History   Grav Para Term Preterm Abortions TAB SAB Ect Mult Living                 Review of Systems  Constitutional: Positive for fever. Negative for chills.  Respiratory: Negative for shortness of breath.   Gastrointestinal: Negative for nausea, vomiting, abdominal pain and  diarrhea.  Musculoskeletal: Positive for arthralgias. Negative for back pain, neck pain and neck stiffness.  Skin: Positive for wound.  Neurological: Negative for dizziness, weakness, light-headedness, numbness and headaches.  All other systems reviewed and are negative.  Allergies  Latex; Penicillins; Codeine; Sulfa antibiotics; and Tape  Home Medications   Prior to Admission medications   Medication Sig Start Date End Date Taking? Authorizing Provider  ALPRAZolam Prudy Feeler(XANAX) 0.5 MG tablet Take 0.5 mg by mouth as needed for anxiety.     Historical Provider, MD  amphetamine-dextroamphetamine (ADDERALL XR) 25 MG 24 hr capsule Take 25 mg by mouth every morning.    Historical Provider, MD  buPROPion (ZYBAN) 150 MG 12 hr tablet Take 150 mg by mouth 2 (two) times daily.    Historical Provider, MD  calcium carbonate 200 MG capsule Take 250 mg by mouth 2 (two) times daily with a meal.    Historical Provider, MD  citalopram (CELEXA) 20 MG tablet Take 40 mg by mouth daily.     Historical Provider, MD  clindamycin (CLEOCIN) 150 MG capsule Take 2 capsules (300 mg total) by mouth 3 (three) times daily. May dispense as 150mg  capsules 04/09/14   Mora BellmanHannah S Merrell, PA-C  hydrochlorothiazide (HYDRODIURIL) 25 MG tablet Take 25 mg by mouth daily.    Historical Provider, MD  lamoTRIgine (LAMICTAL) 25 MG tablet Take 100 mg by mouth daily.     Historical Provider, MD  Multiple Vitamin (  MULTIVITAMIN) tablet Take 1 tablet by mouth daily.    Historical Provider, MD  ondansetron (ZOFRAN) 4 MG tablet Take 1 tablet (4 mg total) by mouth every 6 (six) hours. 04/09/14   Mora Bellman, PA-C  oxyCODONE-acetaminophen (PERCOCET/ROXICET) 5-325 MG per tablet Take 2 tablets by mouth every 4 (four) hours as needed for severe pain. 04/09/14   Mora Bellman, PA-C  traZODone (DESYREL) 50 MG tablet Take 50 mg by mouth at bedtime.    Historical Provider, MD  valsartan-hydrochlorothiazide (DIOVAN-HCT) 160-25 MG per tablet Take 1 tablet  by mouth daily.    Historical Provider, MD  Vitamin D, Ergocalciferol, (DRISDOL) 50000 UNITS CAPS capsule Take 50,000 Units by mouth every 7 (seven) days.    Historical Provider, MD   Triage Vitals: BP 132/94  Pulse 112  Temp(Src) 99.4 F (37.4 C) (Oral)  Resp 18  SpO2 99%  LMP 04/04/2014  Physical Exam  Nursing note and vitals reviewed. Constitutional: She is oriented to person, place, and time. She appears well-developed and well-nourished. No distress.  HENT:  Head: Normocephalic and atraumatic.  Eyes: EOM are normal.  Neck: Neck supple. No tracheal deviation present.  Cardiovascular: Normal rate.   Pulmonary/Chest: Effort normal. No respiratory distress.  Musculoskeletal: Normal range of motion.  Neurological: She is alert and oriented to person, place, and time.  Skin: Skin is warm and dry. No erythema.  Wound to L lateral wrist and medial wrist. No drainage noted.  Mild associated tenderness with palpation, mild swelling.  No erythema.   Psychiatric: She has a normal mood and affect. Her behavior is normal.   ED Course  Procedures (including critical care time)  COORDINATION OF CARE: 3:12 PM-Will consult with Dr. Mina Marble and f/u with patient. Treatment plan discussed with patient and patient agrees.  3:32 PM-Consulted with Dr. Mina Marble who recommended soaking the wound to help with drainage. Recommended to avoid neosporin. Dr. Mina Marble informed patient he is available for f/u tomorrow or 04/15/2014 at his office.   Labs Review Labs Reviewed  CBC - Abnormal; Notable for the following:    WBC 11.6 (*)    MCV 102.7 (*)    MCH 36.2 (*)    All other components within normal limits    Imaging Review No results found.   EKG Interpretation None      MDM   Final diagnoses:  Animal bite  Medication refill   Patient presents after a dog bite. Her EMR the patient was was to follow with Dr. Janee Morn on Sunday for reevaluation of her wound. Fever of 99.4, pulse of  112. The patient reports she missed this due to taking Percocet and falling asleep.  She also reports she has lost her antibiotic medication.  Discussed history, condition with Dr. Freida Busman who advises consult to Dr. Mina Marble.  Dr. Mina Marble advises the soap water soaks, dry dressings continue antibiotics and he will follow up an outpatient.  Patient has been given her next round of rabies injections. We'll have the patient followup in the ED for the next round of rabies injections. Discussed lab results and treatment plan with the patient. Return precautions given. Reports understanding and no other concerns at this time.  Patient is stable for discharge at this time.  Meds given in ED:  Medications  rabies vaccine (RABAVERT) injection 1 mL (1 mL Intramuscular Given 04/12/14 1557)  clindamycin (CLEOCIN) IVPB 600 mg (0 mg Intravenous Stopped 04/12/14 1630)    Discharge Medication List as of 04/12/2014  4:32 PM  I personally performed the services described in this documentation, which was scribed in my presence. The recorded information has been reviewed and is accurate.     Clabe SealLauren M Lashon Beringer, PA-C 04/14/14 2156

## 2014-04-12 NOTE — ED Notes (Signed)
Pt received rabies vaccines on Friday, still having pain, pt reports losing her antibiotic prescription and needs another as well as her second rabies shot.

## 2014-04-15 ENCOUNTER — Encounter: Payer: BC Managed Care – PPO | Admitting: *Deleted

## 2014-04-15 VITALS — Wt 128.0 lb

## 2014-04-15 DIAGNOSIS — F5000 Anorexia nervosa, unspecified: Secondary | ICD-10-CM

## 2014-04-15 NOTE — Progress Notes (Signed)
[facility]  No appointment yet.  Stopped seeing [employer] CorporationMatt Sixberry  Assessment:  Has had multiple IV antibiotics and rabies vaccine and oral antibiotic. Has seen hand specialist twice.  Wound is clean, but not healing.  She's keeping it clean and dry  Asked about having patrick come to nutrition appointments. They are considering a hand fastening ceremony. She has been trying really hard to eat tid, but sometimes only twice.  She is done with cottage cheese due to vomiting cottage cheese.  Her stomach is better right now.  She is eating a burger and fries today in session.  She drank a cup of milk this am and has had water to drink today.  She will eat a packet of bacon cheddar crackers for snack; yesterday she had a chocolate and banana smoothie.  She has cut back on leafy greens, taking prilosec and tums for her gallbladder.  Her mood is more stabilized from the lamictal.  She will see Gina Daniels tomorrow.  Yesterday has CIB, michelinas frozen meal and 2 slices pizza  Got her vape back and is smoking about 1/2 a pack each day.  Drinks 3-4 beers at a time, but only 3 nights a week.  They don't drink during the week and won't drink much this weekend because he'll be on call.  She hasn't been focusing well and called to get a low dose of aderrall, but hasn't heard back  She is currently at her IBW  Safe foods: mac-n-cheese, bacon cheese burgers form BK, whatever patrick smokes on the weekends, used to be cottage cheese, michelina frozen meals, likes salty snacks, bananas, applesauce  Fear foods: frozen pizzas, hot pockets, potatoes (loaded potatoes, chips, potato soup, fries), dr. Reino KentPepper    Nutrition Diagnosis: NI-1.4 Inadequate energy intake As related to eating disorder. As evidenced by calorie intake of 1200 kcal/day on average vs recommended1800 kcal/day.   Intervention:  Continue to limit cigarettes and alcohol Aim for 3 meals/day and 1  snack Eat while distracted: read, listen to music, watch tv  B: carntion breakfast essentials, belvita bites L: michelina, lean pocket S: crackers or fruit D: patrick food with vegetable  Follow up: 1 week

## 2014-04-15 NOTE — Patient Instructions (Signed)
Continue to limit cigarettes and alcohol Aim for 3 meals/day and 1 snack Eat while distracted: read, listen to music, watch tv  B: carntion breakfast essentials, belvita bites L: michelina, lean pocket S: crackers or fruit D: patrick food with vegetable

## 2014-04-16 NOTE — ED Provider Notes (Signed)
Medical screening examination/treatment/procedure(s) were performed by non-physician practitioner and as supervising physician I was immediately available for consultation/collaboration.   EKG Interpretation None       Gina BakerAnthony T Kaizlee Carlino, MD 04/16/14 1422

## 2014-04-22 ENCOUNTER — Ambulatory Visit: Payer: BC Managed Care – PPO | Admitting: *Deleted

## 2014-04-29 ENCOUNTER — Encounter: Payer: BC Managed Care – PPO | Attending: Nurse Practitioner | Admitting: *Deleted

## 2014-04-29 ENCOUNTER — Ambulatory Visit: Payer: BC Managed Care – PPO | Admitting: *Deleted

## 2014-04-29 DIAGNOSIS — F5000 Anorexia nervosa, unspecified: Secondary | ICD-10-CM | POA: Insufficient documentation

## 2014-04-29 DIAGNOSIS — E440 Moderate protein-calorie malnutrition: Secondary | ICD-10-CM | POA: Insufficient documentation

## 2014-04-29 DIAGNOSIS — Z713 Dietary counseling and surveillance: Secondary | ICD-10-CM | POA: Insufficient documentation

## 2014-04-29 DIAGNOSIS — R638 Other symptoms and signs concerning food and fluid intake: Secondary | ICD-10-CM | POA: Insufficient documentation

## 2014-04-29 NOTE — Progress Notes (Signed)
Referred to Jeralyn Ruth[facility]Julie Mitchell at [facility].  No appointment yet.  Stopped seeing [employer]Matt Sixberry  Assessment:  Moved out for a week.  Stayed with a friend. ate well and didn't drink the whole week. Is back with patrick now and their new motto is "never again."  They made a meal plan and she's been eating each night.  Both patrick and dylan are being nicer to [place] since she's been home.   Dinners are planned in advance and balanced.  She eats leftovers for lunch.  Breakfast is hard because she doesn't get hungry.  Drinks 16 oz water in the morning.  Drinks Dr. Reino KentPepper, but doesn't have any at home.  She has to go to the store to get one.  Her gallbladder is acting up again, but she wont' get it looked at until it's severe enough to get removed.  She manages it with tums.  She made some cherry cobbler.  She is maintaining her weight around 126-127 lb.  Now that she is home, they are drinking very little.  There is beer in the fridge and they aren't finishing it each night.  She goes to bed earlier now and does sleep during the day, but must be catching up on missed sleep while she was staying at her friend's house.  She's been cleaning the house and getting fresh stuff.    Hasn't seen Jeralyn RuthsJulie Mitchell yet.  Plans to call her.  [facility].   She knows she needs breakfast and snacks.  Milk is adversive right now after her being sick.  She will eat breakfast on the weekend when patrick makes it, but not on the weekdays.  She has been cooking more; cleaning the kitchen gives her panic attacks, but she is able to at least cook.    Smokes <ppd.  Did smoke more pot when she was at her friend's house because she was out of zanex    Safe foods: mac-n-cheese, bacon cheese burgers form BK, whatever patrick smokes on the weekends, used to be cottage cheese, michelina frozen meals, likes salty snacks, bananas, applesauce  Fear foods: frozen pizzas, hot pockets, potatoes (loaded potatoes,  chips, potato soup, fries), dr. Reino KentPepper    Nutrition Diagnosis: NI-1.4 Inadequate energy intake As related to eating disorder. As evidenced by calorie intake of 1200 kcal/day on average vs recommended1800 kcal/day.   Intervention:  Continue to limit cigarettes and alcohol Aim for 3 meals/day and 1 snack Eat while distracted: read, listen to music, watch tv  B: carntion breakfast essentials, belvita bites L: michelina, lean pocket S: crackers or fruit D: patrick food with vegetable  Follow up: 1 week

## 2014-05-03 ENCOUNTER — Ambulatory Visit: Payer: BC Managed Care – PPO | Admitting: *Deleted

## 2014-05-06 ENCOUNTER — Encounter: Payer: BC Managed Care – PPO | Admitting: *Deleted

## 2014-05-06 DIAGNOSIS — F509 Eating disorder, unspecified: Secondary | ICD-10-CM

## 2014-05-06 NOTE — Progress Notes (Signed)
[facility] to [facility] Daniels at [facility].  No appointment yet.  Stopped seeing Gina Daniels  Assessment:  Eating at least 2 meals.  She and Gina Daniels are cooking dinner and then she eats the leftovers for lunch.  She's actaully been doing more of the cooking.  They continue to plan out their meals and she eats variety of things.  She eats a vegetable 3 nights/week.  She has strawberries at home and is making strawberry shortcake.  Breakfast is still a struggle  She's been cleaning and getting rid of stuff.  She and Gina Daniels are doing much better.  They're doing things together: cooking, cleaning, watching movies, sleeping...   Hasn't seen Gina Daniels yet.  Has left 2 messages.  Saw Gina Daniels this week.  She's been journaling.  She's been talking more with Gina Daniels as well.  She' snot burying her feelings, but she's talking about them.Gina Daniels.  Gina Daniels has decided that she shouldn't work and likes her staying home   She knows she needs breakfast and snacks.  Milk is adversive right now after her being sick.  She will eat breakfast on the weekend when Gina Daniels makes it, but not on the weekdays.  She has been cooking more; cleaning the Daniels gives her panic attacks, but she is able to at least cook.    Smokes <ppd.  Did smoke more pot when she was at her friend's house because she was out of zanex    Safe foods: mac-n-cheese, bacon cheese burgers form BK, whatever Gina Daniels smokes on the weekends, used to be cottage cheese, michelina frozen meals, likes salty snacks, bananas, applesauce  Fear foods: frozen pizzas, hot pockets, potatoes (loaded potatoes, chips, potato soup, fries), dr. Reino KentPepper    Nutrition Diagnosis: NI-1.4 Inadequate energy intake As related to eating disorder. As evidenced by calorie intake of 1200 kcal/day on average vs recommended1800 kcal/day.   Intervention:  Continue to limit cigarettes and alcohol Aim for 3 meals/day and 1 snack Eat while distracted: read,  listen to music, watch tv  Aim for fruit and vegetable each day  Follow up: 1 week

## 2014-05-13 ENCOUNTER — Ambulatory Visit: Payer: BC Managed Care – PPO | Admitting: *Deleted

## 2014-05-20 ENCOUNTER — Encounter: Payer: BC Managed Care – PPO | Admitting: *Deleted

## 2014-05-20 DIAGNOSIS — F5000 Anorexia nervosa, unspecified: Secondary | ICD-10-CM

## 2014-05-20 NOTE — Progress Notes (Signed)
  Assessment:  Eats at least 3 times each day.  Weighs 130:   States she is indifferent about her weight.  She has been doing more squats, ballet bar and feels like it's more muscle.   She and patrick are cooking dinner and then she eats the leftovers for lunch.  She's eating more bread: she'll actually eat the hamburger bun.  She's eating more starches like potatoes.  Her energy level is up some.  Not having to take as many breaks.   Hasn't been to see [employer].  Is on a call list.  Hasn't been able to see [place] due to full schedule.  Is on a wait list. Has appt to see Dr. Wylene Simmer June 23.  Isn't happy with Kandace Blitz, NP.  Hasn't been returning her phone calls. States she doesn't need her xanax as much- is giving them to [employer].  She's doing better, but has her days where she doesn't want to get out of bed sometimes.  Got denied disability.  She is going to reapply  Plans to start walking.  Is also trying to take tai chi.  Showering every day now.  Wants to plant a garden. Isn't getting adequate fruits or vegetables.  24 hr recall Bagel with extra cream cheese BBQ pork  Hamburger on bun.  Hot dog no bun 2 slices cheese 4 beers  Nothing today. Monster energy drink, cheerwine, and water.  Plans to get something on her way home.      Smokes <ppd.  Did smoke more pot every day in order to eat more.   Safe foods: mac-n-cheese, bacon cheese burgers form BK, whatever patrick smokes on the weekends, used to be cottage cheese, michelina frozen meals, likes salty snacks, bananas, applesauce  Fear foods: frozen pizzas, hot pockets, potatoes (loaded potatoes, chips, potato soup, fries), dr. Malachi Bonds    Nutrition Diagnosis: NI-5.5 Imbalance of nutrients as related to poor fruit and vegetable intake as evidenced by dietary recall  Intervention:  Continue to limit cigarettes and alcohol Aim for 3 meals/day and 1 snack  Aim for fruit and vegetable each day  Follow up: 1  week

## 2014-05-27 ENCOUNTER — Encounter: Payer: BC Managed Care – PPO | Attending: Nurse Practitioner | Admitting: *Deleted

## 2014-05-27 DIAGNOSIS — E440 Moderate protein-calorie malnutrition: Secondary | ICD-10-CM | POA: Insufficient documentation

## 2014-05-27 DIAGNOSIS — F5000 Anorexia nervosa, unspecified: Secondary | ICD-10-CM | POA: Insufficient documentation

## 2014-05-27 DIAGNOSIS — R638 Other symptoms and signs concerning food and fluid intake: Secondary | ICD-10-CM | POA: Insufficient documentation

## 2014-05-27 DIAGNOSIS — Z713 Dietary counseling and surveillance: Secondary | ICD-10-CM | POA: Insufficient documentation

## 2014-05-27 NOTE — Progress Notes (Signed)
Gina Daniels at [facility], new PCP.  (Changed Xanax , BP is also elevated) Has no visit with Evonnie Dawes, but does has appt with Dr. Toy Care coming up.   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  Is having people over.  She's doing more things socially Is eating applesauce, rice, chicken pattie sandwiches with cheese and mayo. Having burgers tonight.  Had mac-n-cheese with extra cheese.  2 bottle of water so far.  Did see Nira Conn on Monday.  Mondays are bad days for her for the separation and so now is going on Tuesdays.    Eats at least 3 times each day.  Weighs 130:   States she is indifferent about her weight.  She has been doing more squats, ballet bar and feels like it's more muscle.   She and patrick are cooking dinner and then she eats the leftovers for lunch.  She's eating more bread: she'll actually eat the hamburger bun.  She's eating more starches like potatoes.  Her energy level is up some.  Not having to take as many breaks.     Plans to start walking.  Is also trying to take tai chi.  Showering every day now.  Wants to plant a garden. Isn't getting adequate fruits or vegetables. Is doing yoga for the past 4 days.  Sleeps better. Cooking more and grilling.       Smokes <ppd.  Did smoke more pot every day in order to eat more.  Hasn't smoked cigarettes at all today and no vape.  Drinks 3 beers, but not every day.     Safe foods: mac-n-cheese, bacon cheese burgers form BK, whatever patrick smokes on the weekends, used to be cottage cheese, michelina frozen meals, likes salty snacks, bananas, applesauce  Fear foods: frozen pizzas, hot pockets, potatoes (loaded potatoes, chips, potato soup, fries), dr. Malachi Bonds    Nutrition Diagnosis: NI-5.5 Imbalance of nutrients as related to poor fruit and vegetable intake as evidenced by dietary recall  Intervention:  Continue to limit cigarettes and alcohol Aim for 3 meals/day and 1 snack  Aim for fruit and vegetable each  day  Follow up: 1 week

## 2014-05-31 ENCOUNTER — Other Ambulatory Visit: Payer: Self-pay | Admitting: Family

## 2014-05-31 DIAGNOSIS — R1011 Right upper quadrant pain: Secondary | ICD-10-CM

## 2014-06-03 ENCOUNTER — Ambulatory Visit: Payer: BC Managed Care – PPO | Admitting: *Deleted

## 2014-06-04 ENCOUNTER — Ambulatory Visit
Admission: RE | Admit: 2014-06-04 | Discharge: 2014-06-04 | Disposition: A | Discharge status: Home / Self Care | Payer: BC Managed Care – PPO | Source: Ambulatory Visit | Attending: Family | Admitting: Family

## 2014-06-04 DIAGNOSIS — R1011 Right upper quadrant pain: Secondary | ICD-10-CM

## 2014-06-07 ENCOUNTER — Ambulatory Visit: Payer: BC Managed Care – PPO | Admitting: *Deleted

## 2014-06-10 ENCOUNTER — Ambulatory Visit: Payer: BC Managed Care – PPO | Admitting: *Deleted

## 2014-07-01 ENCOUNTER — Encounter: Payer: BC Managed Care – PPO | Attending: Nurse Practitioner | Admitting: *Deleted

## 2014-07-01 DIAGNOSIS — F5000 Anorexia nervosa, unspecified: Secondary | ICD-10-CM

## 2014-07-01 DIAGNOSIS — R638 Other symptoms and signs concerning food and fluid intake: Secondary | ICD-10-CM | POA: Insufficient documentation

## 2014-07-01 DIAGNOSIS — Z713 Dietary counseling and surveillance: Secondary | ICD-10-CM | POA: Insufficient documentation

## 2014-07-01 DIAGNOSIS — E440 Moderate protein-calorie malnutrition: Secondary | ICD-10-CM | POA: Insufficient documentation

## 2014-07-01 NOTE — Progress Notes (Signed)
[facility], new P[facility].    Started with Dr. Evelene CroonKaur who made multiple mediation changes.    Assessment: haven't felt like eating.  Talked to her via text earlier this week. She hadn't eaten in 3 days.  She has smoked 1/2 pack of cigarettes in the past 24 hours which she says is less.  9 beers each night.  Weighs 124 lb (lost 6 pounds):   Eats maybe once a day.  Didn't eat anything yesterday.  New medication requires food intake.  Bought toaster streudel.  Eats grilled cheese or mac-n-cheese.  Ate a lot on the fourth of July in front of people who know she has an eating disorder.  Smokes less pot, only 1 time a day.  Gina DimesDylan is being extremely difficult     Safe foods: mac-n-cheese, toaster streudel  Fear foods: frozen pizzas, hot pockets, potatoes (loaded potatoes, chips, potato soup, fries), dr. Reino KentPepper    Nutrition Diagnosis: NI-5.5 Imbalance of nutrients as related to poor fruit and vegetable intake as evidenced by dietary recall  Intervention:  For every beer, drink milk or juice Aim for toaster streudel every morning, mac-n-cheese, bananas or rice or applesauce Goal is to 2 twice a day When you're struggling look at your tatoos: HOPE,  Think of the puppies- they're fragile and vulnerable and need constant attention and love and nourishment and to be taken care of- so do you!!   Follow up: 1 week

## 2014-07-01 NOTE — Patient Instructions (Signed)
For every beer, drink milk or juice Aim for toaster streudel every morning, mac-n-cheese, bananas or rice or applesauce Goal is to 2 twice a day When you're struggling look at your tatoos: HOPE,  Think of the puppies- they're fragile and vulnerable and need constant attention and love and nourishment and to be taken care of- so do you!!

## 2014-07-09 ENCOUNTER — Encounter: Payer: BC Managed Care – PPO | Admitting: *Deleted

## 2014-07-09 VITALS — Wt 127.0 lb

## 2014-07-09 DIAGNOSIS — F509 Eating disorder, unspecified: Secondary | ICD-10-CM
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
## 2014-07-09 NOTE — Patient Instructions (Addendum)
Aim to eat twice a day:  Ask patrick to bring you something if that's the sure fire way to get you to eat For every beer, drink a glass of milk or juice Plan out meals with patrick again

## 2014-07-15 ENCOUNTER — Ambulatory Visit: Payer: BC Managed Care – PPO | Admitting: *Deleted

## 2014-07-22 ENCOUNTER — Ambulatory Visit: Payer: BC Managed Care – PPO | Admitting: *Deleted

## 2014-07-29 ENCOUNTER — Encounter: Payer: BC Managed Care – PPO | Attending: Nurse Practitioner | Admitting: *Deleted

## 2014-07-29 VITALS — Wt 126.6 lb

## 2014-07-29 DIAGNOSIS — F5000 Anorexia nervosa, unspecified: Secondary | ICD-10-CM | POA: Insufficient documentation

## 2014-07-29 DIAGNOSIS — E440 Moderate protein-calorie malnutrition: Secondary | ICD-10-CM | POA: Insufficient documentation

## 2014-07-29 DIAGNOSIS — Z713 Dietary counseling and surveillance: Secondary | ICD-10-CM | POA: Diagnosis not present

## 2014-07-29 DIAGNOSIS — R638 Other symptoms and signs concerning food and fluid intake: Secondary | ICD-10-CM | POA: Insufficient documentation
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
## 2014-08-12 ENCOUNTER — Ambulatory Visit: Payer: BC Managed Care – PPO | Admitting: *Deleted

## 2014-08-26 ENCOUNTER — Encounter: Payer: BC Managed Care – PPO | Attending: Nurse Practitioner | Admitting: *Deleted

## 2014-08-26 DIAGNOSIS — R638 Other symptoms and signs concerning food and fluid intake: Secondary | ICD-10-CM | POA: Diagnosis not present

## 2014-08-26 DIAGNOSIS — F5000 Anorexia nervosa, unspecified: Secondary | ICD-10-CM

## 2014-08-26 DIAGNOSIS — E440 Moderate protein-calorie malnutrition: Secondary | ICD-10-CM | POA: Diagnosis not present

## 2014-08-26 DIAGNOSIS — Z713 Dietary counseling and surveillance: Secondary | ICD-10-CM | POA: Insufficient documentation

## 2014-08-26 NOTE — Progress Notes (Signed)
Appointment start time: 1300  Appointment end time: 1400  Assessment:  Gina Daniels is here for follow up nutrition counseling pertaining to her eating disorder.  She maintains a healthy weight by consuming excessive amounts of beer: 4-8 beers/night sometimes more.  She eats maybe 1-2 times a day: frozen meals or fast food.  She consumed very little fruits and/or vegetables.  She continues to smoke daily.  Her therapist has instructed her to keep a food log.  [name] used to keep a food log, but it was triggering to think about her food intake so much, but [name] is insistent right now.  The log reveals she waits until night to eat, eats a large amount, drinks a large amount, and then repeats the next day.  She was drinking Boost 2 times a day until she ran out.     She got married 2 weeks ago and that has helped with her anxiety some.  She takes 2 zanax most days now instead of 4.  She suspects she might be pregnant, despite a tubal ligation.  If she is pregnant, she insists she will quit smoking and drinking and will eat better.  She hopes this might actually be a way to expedite her recovery  Nutrition diagnosis: NB 1.5: Disordered eating pattern as related to meal skipping and excessive alcohol consumption.  As evidenced by eating disorder   Intervention: Limit beers to 4/day Take multivitamin, calicium, iron, and vitamin C Get some canned fruit and vegetables Goal is 3 meals each day  Monitoring:  Patient will follow up in 2 weeks

## 2014-09-01 ENCOUNTER — Emergency Department (HOSPITAL_COMMUNITY)
Admission: EM | Admit: 2014-09-01 | Discharge: 2014-09-02 | Disposition: A | Discharge status: Home / Self Care | Payer: BC Managed Care – PPO | Attending: Emergency Medicine | Admitting: Emergency Medicine

## 2014-09-01 ENCOUNTER — Emergency Department (HOSPITAL_COMMUNITY): Payer: BC Managed Care – PPO

## 2014-09-01 ENCOUNTER — Encounter (HOSPITAL_COMMUNITY): Payer: Self-pay | Admitting: Emergency Medicine

## 2014-09-01 DIAGNOSIS — W268XXA Contact with other sharp object(s), not elsewhere classified, initial encounter: Secondary | ICD-10-CM | POA: Diagnosis not present

## 2014-09-01 DIAGNOSIS — Y9289 Other specified places as the place of occurrence of the external cause: Secondary | ICD-10-CM | POA: Diagnosis not present

## 2014-09-01 DIAGNOSIS — Z792 Long term (current) use of antibiotics: Secondary | ICD-10-CM | POA: Insufficient documentation

## 2014-09-01 DIAGNOSIS — Z8719 Personal history of other diseases of the digestive system: Secondary | ICD-10-CM | POA: Diagnosis not present

## 2014-09-01 DIAGNOSIS — F17200 Nicotine dependence, unspecified, uncomplicated: Secondary | ICD-10-CM | POA: Diagnosis not present

## 2014-09-01 DIAGNOSIS — Z79899 Other long term (current) drug therapy: Secondary | ICD-10-CM | POA: Diagnosis not present

## 2014-09-01 DIAGNOSIS — I10 Essential (primary) hypertension: Secondary | ICD-10-CM | POA: Diagnosis not present

## 2014-09-01 DIAGNOSIS — Y9389 Activity, other specified: Secondary | ICD-10-CM | POA: Insufficient documentation

## 2014-09-01 DIAGNOSIS — S91309A Unspecified open wound, unspecified foot, initial encounter: Secondary | ICD-10-CM | POA: Diagnosis not present

## 2014-09-01 DIAGNOSIS — S91311A Laceration without foreign body, right foot, initial encounter: Secondary | ICD-10-CM

## 2014-09-01 DIAGNOSIS — Z8659 Personal history of other mental and behavioral disorders: Secondary | ICD-10-CM | POA: Insufficient documentation

## 2014-09-01 MED ORDER — HYDROMORPHONE HCL PF 1 MG/ML IJ SOLN
1.0000 mg | Freq: Once | INTRAMUSCULAR | Status: AC
  Administered 2014-09-02: 1 mg via INTRAVENOUS
  Filled 2014-09-01: qty 1

## 2014-09-01 NOTE — ED Notes (Signed)
Bed: ZO10 Expected date:  Expected time:  Means of arrival:  Comments: EMS - extensive foot laceration

## 2014-09-01 NOTE — ED Provider Notes (Signed)
CSN: [id number]     Arrival date & time 09/01/14  2225 History   First MD Initiated Contact with Patient 09/01/14 2312     Chief Complaint  Patient presents with  . Extremity Laceration    HPI Patient is a 33 year old female with a history of HTN, PTSD and anorexia, who presents with right anterior ankle laceration. She is accompanied by her husband. Reports tripping over a picture frame this evening (approximately 9:00 pm) and sustaining laceration with profuse bleeding. Husband applied tourniquet and compression and cleaned the wound at the site with sterile saline, and applied first dressing. EMS changed dressing on the route to ED. Currently patient reports light headedness, throbbing foot pain at 7/10, and loss of sensation in the right foot. Denies history of coagulation disorder or anticoagulation therapy, but states she always bruises easily. Patient is a current smoker and reports alcohol intake, including 3 beers this evening prior to sustaining the laceration. Denies any recreational drug use.  Past Medical History  Diagnosis Date  . Gallstones    Past Surgical History  Procedure Laterality Date  . Tubal ligation  2002  . Wisdom tooth extraction  2002   Family History  Problem Relation Age of Onset  . Hypertension Mother   . Hypertension Father   . Heart attack Father   . Diabetes Maternal Grandmother    History  Substance Use Topics  . Smoking status: Current Every Day Smoker -- 0.50 packs/day for 21 years    Types: Cigarettes  . Smokeless tobacco: Not on file  . Alcohol Use: Yes   OB History   Grav Para Term Preterm Abortions TAB SAB Ect Mult Living                 Review of Systems  Review of systems is negative except as noted in the HPI.   Allergies  Latex; Penicillins; Codeine; Sulfa antibiotics; and Tape  Home Medications   Prior to Admission medications   Medication Sig Start Date End Date Taking? Authorizing Provider  ALPRAZolam Prudy Feeler) 0.5 MG tablet  Take 1 mg by mouth 3 (three) times daily as needed for anxiety.     Historical Provider, MD  amphetamine-dextroamphetamine (ADDERALL XR) 25 MG 24 hr capsule Take 25 mg by mouth every morning.    Historical Provider, MD  Ascorbic Acid (VITAMIN C) 100 MG tablet Take 100 mg by mouth daily.    Historical Provider, MD  buPROPion (ZYBAN) 150 MG 12 hr tablet Take 150 mg by mouth 2 (two) times daily.    Historical Provider, MD  calcium carbonate 200 MG capsule Take 250 mg by mouth 2 (two) times daily with a meal.    Historical Provider, MD  citalopram (CELEXA) 20 MG tablet Take 20 mg by mouth daily.     Historical Provider, MD  clindamycin (CLEOCIN) 150 MG capsule Take 2 capsules (300 mg total) by mouth 3 (three) times daily. May dispense as  capsules 04/12/14   Mellody Drown, PA-C  ferrous sulfate 325 (65 FE) MG tablet Take 325 mg by mouth daily with breakfast.    Historical Provider, MD  hydrochlorothiazide (HYDRODIURIL) 25 MG tablet Take 25 mg by mouth daily.    Historical Provider, MD  lamoTRIgine (LAMICTAL) 25 MG tablet Take 150 mg by mouth daily.     Historical Provider, MD  Multiple Vitamin (MULTIVITAMIN) tablet Take 1 tablet by mouth daily.    Historical Provider, MD  neomycin-bacitracin-polymyxin (NEOSPORIN) ointment Apply 1 application topically daily.  apply to eye    Historical Provider, MD  ondansetron (ZOFRAN) 4 MG tablet Take 1 tablet (4 mg total) by mouth every 6 (six) hours. 04/09/14   Mora Bellman, PA-C  oxyCODONE-acetaminophen (PERCOCET/ROXICET) 5-325 MG per tablet Take 2 tablets by mouth every 4 (four) hours as needed for severe pain. 04/09/14   Mora Bellman, PA-C  traZODone (DESYREL) 50 MG tablet Take 50 mg by mouth at bedtime as needed for sleep.     Historical Provider, MD  Vilazodone HCl (VIIBRYD) 40 MG TABS Take by mouth daily.    Historical Provider, MD  Vitamin D, Ergocalciferol, (DRISDOL) 50000 UNITS CAPS capsule  01/21/14   Historical Provider, MD   BP 133/95  Pulse 78   Temp(Src) 98.1 F (36.7 C) (Oral)  Resp 16  SpO2 97%  LMP 08/22/2014 Physical Exam  Nursing note and vitals reviewed. Constitutional: She appears well-developed and well-nourished.  Cardiovascular: Normal rate and regular rhythm.  Exam reveals no gallop and no friction rub.   No murmur heard. Pulses:      Dorsalis pedis pulses are 0 on the right side, and 2+ on the left side.       Posterior tibial pulses are 2+ on the right side, and 2+ on the left side.  Right toes and foot are cold to touch; leg is warm to touch superior to the laceration. Undetectable right DP pulse.  Musculoskeletal:       Right ankle: She exhibits laceration and abnormal pulse.       Feet:  4" deep laceration on the antero-lateral right ankle.   Neurological: A sensory deficit is present.  Sensation absent over toes 2-5, sole of the foot. Decreased sensation over big toe. Unable to dorsiflex and move toes 2-5. Decreased active movement of big toe.     ED Course  Procedures (including critical care time) Spoke with Dr.Aluisio and he advised to have her follow up in his office and we should primarily close the wound. The patient may have a tendon injury as well. Wound was copiously irrigated. No foreign body seen or palpated  LACERATION REPAIR Performed by: Carlyle Dolly Authorized by: Carlyle Dolly Consent: Verbal consent obtained. Risks and benefits: risks, benefits and alternatives were discussed Consent given by: patient Patient identity confirmed: provided demographic data Prepped and Draped in normal sterile fashion Wound explored  Laceration Location: R lateral foot  Laceration Length: 4cm  No Foreign Bodies seen or palpated  Anesthesia: local infiltration  Local anesthetic: lidocaine 2% wo epinephrine  Anesthetic total: 6 ml  Irrigation method: syringe Amount of cleaning: standard  Skin closure: 4-0 Prolene  Number of sutures: 9  Technique: simple  interrupted  Patient tolerance: Patient tolerated the procedure well with no immediate complications.       Carlyle Dolly, PA-C 09/06/14 [phone]

## 2014-09-01 NOTE — ED Notes (Signed)
Per EMS: Pt had a few drinks with husband, was outside and tripped on piece of glass (around 2045). Pt has about 4 inch laceration across the top of her R foot, actively bleeding. A&O x 4.

## 2014-09-02 MED ORDER — OXYCODONE-ACETAMINOPHEN 5-325 MG PO TABS: 1.0000 | ORAL_TABLET | Freq: Four times a day (QID) | ORAL | Status: DC | PRN

## 2014-09-02 MED ORDER — CLINDAMYCIN HCL 300 MG PO CAPS
300.0000 mg | ORAL_CAPSULE | Freq: Four times a day (QID) | ORAL | Status: DC
Start: 2014-09-02 — End: 2014-10-04

## 2014-09-02 NOTE — Progress Notes (Signed)
Orthopedic Tech Progress Note Patient Details:  Gina Daniels 1981/11/03 [id number]  Ortho Devices Type of Ortho Device: Crutches;Short leg splint Ortho Device/Splint Interventions: Application   Haskell Flirt 09/02/2014, 1:45 AM

## 2014-09-02 NOTE — Discharge Instructions (Signed)
Return here as needed. Follow up with Dr. Lequita Halt or the orthopedist of your choice as soon as possible.

## 2014-09-09 ENCOUNTER — Ambulatory Visit: Payer: BC Managed Care – PPO | Admitting: *Deleted

## 2014-09-10 NOTE — ED Provider Notes (Signed)
Medical screening examination/treatment/procedure(s) were performed by non-physician practitioner and as supervising physician I was immediately available for consultation/collaboration.   EKG Interpretation None        Hampton Wixom, MD 09/10/14 0105 

## 2014-09-23 ENCOUNTER — Ambulatory Visit: Payer: BC Managed Care – PPO | Admitting: *Deleted

## 2014-10-04 ENCOUNTER — Encounter (HOSPITAL_COMMUNITY)
Admission: RE | Admit: 2014-10-04 | Discharge: 2014-10-04 | Disposition: A | Discharge status: Home / Self Care | Payer: BC Managed Care – PPO | Source: Ambulatory Visit | Attending: Orthopedic Surgery | Admitting: Orthopedic Surgery

## 2014-10-04 ENCOUNTER — Encounter (HOSPITAL_COMMUNITY): Payer: Self-pay

## 2014-10-04 DIAGNOSIS — I10 Essential (primary) hypertension: Secondary | ICD-10-CM

## 2014-10-04 HISTORY — DX: Cardiac arrhythmia, unspecified: I49.9

## 2014-10-04 HISTORY — DX: Essential (primary) hypertension: I10

## 2014-10-04 HISTORY — DX: Headache, unspecified: R51.9

## 2014-10-04 HISTORY — DX: Anxiety disorder, unspecified: F41.9

## 2014-10-04 HISTORY — DX: Headache: R51

## 2014-10-04 HISTORY — DX: Post-traumatic stress disorder, unspecified: F43.10

## 2014-10-04 HISTORY — DX: Major depressive disorder, single episode, unspecified: F32.9

## 2014-10-04 HISTORY — DX: Depression, unspecified: F32.A

## 2014-10-04 LAB — BASIC METABOLIC PANEL
ANION GAP: 12 (ref 5–15)
BUN: 8 mg/dL (ref 6–23)
CHLORIDE: 97 meq/L (ref 96–112)
CO2: 28 meq/L (ref 19–32)
CREATININE: 0.7 mg/dL (ref 0.50–1.10)
Calcium: 9.8 mg/dL (ref 8.4–10.5)
GFR calc Af Amer: >90 mL/min (ref >90)
GFR calc non Af Amer: >90 mL/min (ref >90)
Glucose, Bld: 82 mg/dL (ref 70–99)
Potassium: 4.2 mEq/L (ref 3.7–5.3)
Sodium: 137 mEq/L (ref 137–147)

## 2014-10-04 LAB — CBC
HCT: 44.6 % (ref 36.0–46.0)
Hemoglobin: 15.4 g/dL — ABNORMAL HIGH (ref 12.0–15.0)
MCH: 34.8 pg — ABNORMAL HIGH (ref 26.0–34.0)
MCHC: 34.5 g/dL (ref 30.0–36.0)
MCV: 100.7 fL — AB (ref 78.0–100.0)
PLATELETS: 272 10*3/uL (ref 150–400)
RBC: 4.43 MIL/uL (ref 3.87–5.11)
RDW: 12.5 % (ref 11.5–15.5)
WBC: 10.4 10*3/uL (ref 4.0–10.5)

## 2014-10-04 LAB — HCG, SERUM, QUALITATIVE: Preg, Serum: NEGATIVE

## 2014-10-04 NOTE — Progress Notes (Addendum)
WENDY AT DR. Laverta BaltimoreHEWITT'S OFFICE STATED SHE WILL PLACE ORDERS.  REQUESTED EKG FROM DR. FULLER [phone].

## 2014-10-04 NOTE — Pre-Procedure Instructions (Signed)
Margretta DittyJenny B Engler  10/04/2014   Your procedure is scheduled on:  Tuesday  10/05/14  [facility] at 1120 AM.  Call this number if you have problems the morning of surgery: [phone]   Remember:   Do not eat food or drink liquids after midnight.   Take these medicines the morning of surgery with A SIP OF WATER:  ALPRAZOLAM IF NEEDED, LAMICTAL, TRAMADOL IF NEEDED, VIIBRYD   (STOP NAPOXEN)   Do not wear jewelry, make-up or nail polish.  Do not wear lotions, powders, or perfumes. You may wear deodorant.  Do not shave 48 hours prior to surgery. Men may shave face and neck.  Do not bring valuables to the hospital.  [facility] is not responsible                  for any belongings or valuables.               Contacts, dentures or bridgework may not be worn into surgery.  Leave suitcase in the car. After surgery it may be brought to your room.  For patients admitted to the hospital, discharge time is determined by your                treatment team.               Patients discharged the day of surgery will not be allowed to drive  home.  Name and phone number of your driver:   Special Instructions:  Special Instructions: [place] - Preparing for Surgery  Before surgery, you can play an important role.  Because skin is not sterile, your skin needs to be as free of germs as possible.  You can reduce the number of germs on you skin by washing with CHG (chlorahexidine gluconate) soap before surgery.  CHG is an antiseptic cleaner which kills germs and bonds with the skin to continue killing germs even after washing.  Please DO NOT use if you have an allergy to CHG or antibacterial soaps.  If your skin becomes reddened/irritated stop using the CHG and inform your nurse when you arrive at Short Stay.  Do not shave (including legs and underarms) for at least 48 hours prior to the first CHG shower.  You may shave your face.  Please follow these instructions  carefully:   1.  Shower with CHG Soap the night before surgery and the morning of Surgery.  2.  If you choose to wash your hair, wash your hair first as usual with your normal shampoo.  3.  After you shampoo, rinse your hair and body thoroughly to remove the Shampoo.  4.  Use CHG as you would any other liquid soap. You can apply chg directly to the skin and wash gently with scrungie or a clean washcloth.  5.  Apply the CHG Soap to your body ONLY FROM THE NECK DOWN.  Do not use on open wounds or open sores.  Avoid contact with your eyes, ears, mouth and genitals (private parts).  Wash genitals (private parts with your normal soap.  6.  Wash thoroughly, paying special attention to the area where your surgery will be performed.  7.  Thoroughly rinse your body with warm water from the neck down.  8.  DO NOT shower/wash with your normal soap after using and rinsing off the CHG Soap.  9.  Pat yourself dry with a clean towel.  10.  Wear clean pajamas.            11.  Place clean sheets on your bed the night of your first shower and do not sleep with pets.  Day of Surgery  Do not apply any lotions/deodorants the morning of surgery.  Please wear clean clothes to the hospital/surgery center.   Please read over the following fact sheets that you were given: Pain Booklet, Coughing and Deep Breathing and Surgical Site Infection Prevention

## 2014-10-05 ENCOUNTER — Encounter (HOSPITAL_COMMUNITY): Payer: BC Managed Care – PPO | Admitting: Anesthesiology

## 2014-10-05 ENCOUNTER — Inpatient Hospital Stay (HOSPITAL_COMMUNITY): Payer: BC Managed Care – PPO | Admitting: Anesthesiology

## 2014-10-05 ENCOUNTER — Encounter (HOSPITAL_COMMUNITY)
Admission: RE | Disposition: A | Discharge status: Home Health | Payer: BC Managed Care – PPO | Source: Ambulatory Visit | Attending: Orthopedic Surgery

## 2014-10-05 ENCOUNTER — Inpatient Hospital Stay (HOSPITAL_COMMUNITY)
Admission: RE | Admit: 2014-10-05 | Discharge: 2014-10-07 | DRG: 909 | Disposition: A | Discharge status: Home Health | Payer: BC Managed Care – PPO | Source: Ambulatory Visit | Attending: Orthopedic Surgery | Admitting: Orthopedic Surgery

## 2014-10-05 ENCOUNTER — Encounter (HOSPITAL_COMMUNITY): Payer: Self-pay | Admitting: *Deleted

## 2014-10-05 DIAGNOSIS — Y838 Other surgical procedures as the cause of abnormal reaction of the patient, or of later complication, without mention of misadventure at the time of the procedure: Secondary | ICD-10-CM | POA: Diagnosis present

## 2014-10-05 DIAGNOSIS — Z79899 Other long term (current) drug therapy: Secondary | ICD-10-CM | POA: Diagnosis not present

## 2014-10-05 DIAGNOSIS — T8131XD Disruption of external operation (surgical) wound, not elsewhere classified, subsequent encounter: Secondary | ICD-10-CM

## 2014-10-05 DIAGNOSIS — F419 Anxiety disorder, unspecified: Secondary | ICD-10-CM | POA: Diagnosis present

## 2014-10-05 DIAGNOSIS — T8131XA Disruption of external operation (surgical) wound, not elsewhere classified, initial encounter: Secondary | ICD-10-CM | POA: Diagnosis present

## 2014-10-05 DIAGNOSIS — F17210 Nicotine dependence, cigarettes, uncomplicated: Secondary | ICD-10-CM | POA: Diagnosis present

## 2014-10-05 DIAGNOSIS — F4310 Post-traumatic stress disorder, unspecified: Secondary | ICD-10-CM | POA: Diagnosis present

## 2014-10-05 DIAGNOSIS — T8130XA Disruption of wound, unspecified, initial encounter: Secondary | ICD-10-CM | POA: Diagnosis present

## 2014-10-05 DIAGNOSIS — Z23 Encounter for immunization: Secondary | ICD-10-CM | POA: Diagnosis not present

## 2014-10-05 DIAGNOSIS — F329 Major depressive disorder, single episode, unspecified: Secondary | ICD-10-CM | POA: Diagnosis present

## 2014-10-05 DIAGNOSIS — I10 Essential (primary) hypertension: Secondary | ICD-10-CM | POA: Diagnosis present

## 2014-10-05 HISTORY — PX: INCISION AND DRAINAGE: SHX5863

## 2014-10-05 SURGERY — INCISION AND DRAINAGE
Anesthesia: General | Site: Foot | Laterality: Right

## 2014-10-05 MED ORDER — HYDROMORPHONE HCL 1 MG/ML IJ SOLN
0.5000 mg | INTRAMUSCULAR | Status: DC | PRN
  Administered 2014-10-05 – 2014-10-07 (×16): 1 mg via INTRAVENOUS
  Filled 2014-10-05 (×18): qty 1

## 2014-10-05 MED ORDER — DIPHENHYDRAMINE HCL 50 MG/ML IJ SOLN
INTRAMUSCULAR | Status: DC | PRN
  Administered 2014-10-05: 12.5 mg via INTRAVENOUS

## 2014-10-05 MED ORDER — FENTANYL CITRATE 0.05 MG/ML IJ SOLN
25.0000 ug | INTRAMUSCULAR | Status: DC | PRN
  Administered 2014-10-05 (×3): 50 ug via INTRAVENOUS

## 2014-10-05 MED ORDER — FENTANYL CITRATE 0.05 MG/ML IJ SOLN
INTRAMUSCULAR | Status: DC | PRN
  Administered 2014-10-05 (×2): 25 ug via INTRAVENOUS
  Administered 2014-10-05: 50 ug via INTRAVENOUS
  Administered 2014-10-05 (×3): 25 ug via INTRAVENOUS
  Administered 2014-10-05: 50 ug via INTRAVENOUS
  Administered 2014-10-05: 25 ug via INTRAVENOUS

## 2014-10-05 MED ORDER — CEFAZOLIN SODIUM 1-5 GM-% IV SOLN
1.0000 g | Freq: Four times a day (QID) | INTRAVENOUS | Status: AC
  Administered 2014-10-05 – 2014-10-06 (×4): 1 g via INTRAVENOUS
  Filled 2014-10-05 (×4): qty 50

## 2014-10-05 MED ORDER — DIPHENHYDRAMINE HCL 25 MG PO CAPS
50.0000 mg | ORAL_CAPSULE | Freq: Every day | ORAL | Status: DC
  Administered 2014-10-05 – 2014-10-06 (×2): 50 mg via ORAL
  Filled 2014-10-05 (×4): qty 2

## 2014-10-05 MED ORDER — PROPOFOL 10 MG/ML IV BOLUS
INTRAVENOUS | Status: DC | PRN
  Administered 2014-10-05: 20 mg via INTRAVENOUS
  Administered 2014-10-05: 170 mg via INTRAVENOUS

## 2014-10-05 MED ORDER — HYDROCHLOROTHIAZIDE 25 MG PO TABS
25.0000 mg | ORAL_TABLET | Freq: Every day | ORAL | Status: DC
  Administered 2014-10-06 – 2014-10-07 (×2): 25 mg via ORAL
  Filled 2014-10-05 (×2): qty 1

## 2014-10-05 MED ORDER — CEFAZOLIN SODIUM-DEXTROSE 2-3 GM-% IV SOLR
INTRAVENOUS | Status: AC
  Filled 2014-10-05: qty 50

## 2014-10-05 MED ORDER — ONDANSETRON HCL 4 MG/2ML IJ SOLN
INTRAMUSCULAR | Status: AC
  Filled 2014-10-05: qty 2

## 2014-10-05 MED ORDER — OXYCODONE HCL 5 MG PO TABS
ORAL_TABLET | ORAL | Status: AC
  Administered 2014-10-05: 14:00:00
  Filled 2014-10-05: qty 2

## 2014-10-05 MED ORDER — ONDANSETRON HCL 4 MG/2ML IJ SOLN
INTRAMUSCULAR | Status: DC | PRN
  Administered 2014-10-05: 4 mg via INTRAVENOUS

## 2014-10-05 MED ORDER — MIDAZOLAM HCL 2 MG/2ML IJ SOLN
INTRAMUSCULAR | Status: AC
  Filled 2014-10-05: qty 2

## 2014-10-05 MED ORDER — CHLORHEXIDINE GLUCONATE 4 % EX LIQD: 60.0000 mL | Freq: Once | CUTANEOUS | Status: DC

## 2014-10-05 MED ORDER — MIDAZOLAM HCL 5 MG/5ML IJ SOLN
INTRAMUSCULAR | Status: DC | PRN
  Administered 2014-10-05: 2 mg via INTRAVENOUS

## 2014-10-05 MED ORDER — FENTANYL CITRATE 0.05 MG/ML IJ SOLN
INTRAMUSCULAR | Status: AC
  Filled 2014-10-05: qty 5

## 2014-10-05 MED ORDER — CEFAZOLIN SODIUM-DEXTROSE 2-3 GM-% IV SOLR
INTRAVENOUS | Status: DC | PRN
  Administered 2014-10-05: 2 g via INTRAVENOUS

## 2014-10-05 MED ORDER — PNEUMOCOCCAL VAC POLYVALENT 25 MCG/0.5ML IJ INJ
0.5000 mL | INJECTION | INTRAMUSCULAR | Status: AC
  Administered 2014-10-06: 0.5 mL via INTRAMUSCULAR
  Filled 2014-10-05: qty 0.5

## 2014-10-05 MED ORDER — INFLUENZA VAC SPLIT QUAD 0.5 ML IM SUSY
0.5000 mL | PREFILLED_SYRINGE | INTRAMUSCULAR | Status: DC
  Filled 2014-10-05: qty 0.5

## 2014-10-05 MED ORDER — ONDANSETRON HCL 4 MG PO TABS: 4.0000 mg | ORAL_TABLET | Freq: Four times a day (QID) | ORAL | Status: DC | PRN

## 2014-10-05 MED ORDER — SENNA 8.6 MG PO TABS
1.0000 | ORAL_TABLET | Freq: Two times a day (BID) | ORAL | Status: DC
  Administered 2014-10-05 – 2014-10-07 (×4): 8.6 mg via ORAL
  Filled 2014-10-05 (×6): qty 1

## 2014-10-05 MED ORDER — SODIUM CHLORIDE 0.9 % IV SOLN: INTRAVENOUS | Status: DC

## 2014-10-05 MED ORDER — OXYCODONE HCL 5 MG PO TABS
5.0000 mg | ORAL_TABLET | ORAL | Status: DC | PRN
  Administered 2014-10-05 – 2014-10-07 (×11): 10 mg via ORAL
  Filled 2014-10-05 (×11): qty 2

## 2014-10-05 MED ORDER — DEXAMETHASONE SODIUM PHOSPHATE 4 MG/ML IJ SOLN
INTRAMUSCULAR | Status: AC
  Filled 2014-10-05: qty 1

## 2014-10-05 MED ORDER — LIDOCAINE HCL (CARDIAC) 20 MG/ML IV SOLN
INTRAVENOUS | Status: DC | PRN
  Administered 2014-10-05: 100 mg via INTRAVENOUS

## 2014-10-05 MED ORDER — LACTATED RINGERS IV SOLN
INTRAVENOUS | Status: DC
  Administered 2014-10-05: 10:00:00 via INTRAVENOUS

## 2014-10-05 MED ORDER — VILAZODONE HCL 20 MG PO TABS
40.0000 mg | ORAL_TABLET | Freq: Every day | ORAL | Status: DC
  Administered 2014-10-06 – 2014-10-07 (×2): 40 mg via ORAL
  Filled 2014-10-05 (×3): qty 2

## 2014-10-05 MED ORDER — ALPRAZOLAM 0.5 MG PO TABS
1.0000 mg | ORAL_TABLET | Freq: Four times a day (QID) | ORAL | Status: DC | PRN
  Administered 2014-10-06 – 2014-10-07 (×4): 1 mg via ORAL
  Filled 2014-10-05 (×4): qty 2

## 2014-10-05 MED ORDER — SODIUM CHLORIDE 0.9 % IR SOLN
Status: DC | PRN
  Administered 2014-10-05: 1000 mL

## 2014-10-05 MED ORDER — DIPHENHYDRAMINE HCL 50 MG/ML IJ SOLN
INTRAMUSCULAR | Status: AC
  Filled 2014-10-05: qty 1

## 2014-10-05 MED ORDER — FENTANYL CITRATE 0.05 MG/ML IJ SOLN
INTRAMUSCULAR | Status: AC
  Administered 2014-10-05: 13:00:00
  Filled 2014-10-05: qty 2

## 2014-10-05 MED ORDER — LACTATED RINGERS IV SOLN
INTRAVENOUS | Status: DC | PRN
  Administered 2014-10-05 (×2): via INTRAVENOUS

## 2014-10-05 MED ORDER — PROPOFOL 10 MG/ML IV BOLUS
INTRAVENOUS | Status: AC
  Filled 2014-10-05: qty 20

## 2014-10-05 MED ORDER — DOCUSATE SODIUM 100 MG PO CAPS
100.0000 mg | ORAL_CAPSULE | Freq: Two times a day (BID) | ORAL | Status: DC
  Administered 2014-10-05 – 2014-10-07 (×4): 100 mg via ORAL
  Filled 2014-10-05 (×6): qty 1

## 2014-10-05 MED ORDER — AMPHETAMINE-DEXTROAMPHET ER 10 MG PO CP24
20.0000 mg | ORAL_CAPSULE | Freq: Every day | ORAL | Status: DC
  Administered 2014-10-06 – 2014-10-07 (×2): 20 mg via ORAL
  Filled 2014-10-05 (×2): qty 2

## 2014-10-05 MED ORDER — ONDANSETRON HCL 4 MG/2ML IJ SOLN: 4.0000 mg | Freq: Four times a day (QID) | INTRAMUSCULAR | Status: DC | PRN

## 2014-10-05 MED ORDER — DEXAMETHASONE SODIUM PHOSPHATE 4 MG/ML IJ SOLN
INTRAMUSCULAR | Status: DC | PRN
  Administered 2014-10-05: 4 mg via INTRAVENOUS

## 2014-10-05 MED ORDER — FENTANYL CITRATE 0.05 MG/ML IJ SOLN
INTRAMUSCULAR | Status: AC
  Administered 2014-10-05: 14:00:00
  Filled 2014-10-05: qty 2

## 2014-10-05 MED ORDER — LAMOTRIGINE 150 MG PO TABS
150.0000 mg | ORAL_TABLET | Freq: Every day | ORAL | Status: DC
  Administered 2014-10-06 – 2014-10-07 (×2): 150 mg via ORAL
  Filled 2014-10-05 (×2): qty 1

## 2014-10-05 MED ORDER — LIDOCAINE HCL (CARDIAC) 20 MG/ML IV SOLN
INTRAVENOUS | Status: AC
  Filled 2014-10-05: qty 5

## 2014-10-05 SURGICAL SUPPLY — 64 items
BANDAGE ELASTIC 4 VELCRO ST LF (GAUZE/BANDAGES/DRESSINGS) ×3 IMPLANT
BANDAGE ESMARK 6X9 LF (GAUZE/BANDAGES/DRESSINGS) ×1 IMPLANT
BLADE SURG 10 STRL SS (BLADE) ×3 IMPLANT
BNDG COHESIVE 4X5 TAN STRL (GAUZE/BANDAGES/DRESSINGS) ×3 IMPLANT
BNDG COHESIVE 6X5 TAN STRL LF (GAUZE/BANDAGES/DRESSINGS) IMPLANT
BNDG CONFORM 3 STRL LF (GAUZE/BANDAGES/DRESSINGS) IMPLANT
BNDG ESMARK 6X9 LF (GAUZE/BANDAGES/DRESSINGS) ×3
CANISTER SUCT 3000ML (MISCELLANEOUS) IMPLANT
CHLORAPREP W/TINT 26ML (MISCELLANEOUS) IMPLANT
COVER SURGICAL LIGHT HANDLE (MISCELLANEOUS) ×3 IMPLANT
CUFF TOURNIQUET SINGLE 34IN LL (TOURNIQUET CUFF) ×3 IMPLANT
CUFF TOURNIQUET SINGLE 44IN (TOURNIQUET CUFF) IMPLANT
DRAIN CHANNEL 10M FLAT 3/4 FLT (DRAIN) IMPLANT
DRAIN PENROSE 1/2X12 LTX STRL (WOUND CARE) IMPLANT
DRAPE U-SHAPE 47X51 STRL (DRAPES) ×3 IMPLANT
DRSG ADAPTIC 3X8 NADH LF (GAUZE/BANDAGES/DRESSINGS) IMPLANT
DRSG PAD ABDOMINAL 8X10 ST (GAUZE/BANDAGES/DRESSINGS) ×3 IMPLANT
DRSG VAC ATS SM SENSATRAC (GAUZE/BANDAGES/DRESSINGS) ×3 IMPLANT
ELECT REM PT RETURN 9FT ADLT (ELECTROSURGICAL) ×3
ELECTRODE REM PT RTRN 9FT ADLT (ELECTROSURGICAL) ×1 IMPLANT
EVACUATOR SILICONE 100CC (DRAIN) IMPLANT
GAUZE SPONGE 4X4 12PLY STRL (GAUZE/BANDAGES/DRESSINGS) IMPLANT
GLOVE BIO SURGEON STRL SZ7 (GLOVE) IMPLANT
GLOVE BIO SURGEON STRL SZ8 (GLOVE) IMPLANT
GLOVE BIOGEL PI IND STRL 7.5 (GLOVE) ×1 IMPLANT
GLOVE BIOGEL PI IND STRL 8 (GLOVE) ×2 IMPLANT
GLOVE BIOGEL PI INDICATOR 7.5 (GLOVE) ×2
GLOVE BIOGEL PI INDICATOR 8 (GLOVE) ×4
GLOVE SURG SS PI 7.0 STRL IVOR (GLOVE) ×6 IMPLANT
GOWN STRL REUS W/ TWL XL LVL3 (GOWN DISPOSABLE) ×1 IMPLANT
GOWN STRL REUS W/TWL XL LVL3 (GOWN DISPOSABLE) ×2
KIT BASIN OR (CUSTOM PROCEDURE TRAY) ×3 IMPLANT
KIT ROOM TURNOVER OR (KITS) ×3 IMPLANT
NS IRRIG 1000ML POUR BTL (IV SOLUTION) ×3 IMPLANT
PACK ORTHO EXTREMITY (CUSTOM PROCEDURE TRAY) ×3 IMPLANT
PAD ARMBOARD 7.5X6 YLW CONV (MISCELLANEOUS) ×6 IMPLANT
PAD CAST 4YDX4 CTTN HI CHSV (CAST SUPPLIES) IMPLANT
PADDING CAST ABS 4INX4YD NS (CAST SUPPLIES) ×2
PADDING CAST ABS COTTON 4X4 ST (CAST SUPPLIES) ×1 IMPLANT
PADDING CAST COTTON 4X4 STRL (CAST SUPPLIES)
SET CYSTO W/LG BORE CLAMP LF (SET/KITS/TRAYS/PACK) ×3 IMPLANT
SOAP 2 % CHG 4 OZ (WOUND CARE) ×3 IMPLANT
SPLINT PLASTER CAST XFAST 5X30 (CAST SUPPLIES) ×1 IMPLANT
SPLINT PLASTER XFAST SET 5X30 (CAST SUPPLIES) ×2
SPONGE LAP 4X18 X RAY DECT (DISPOSABLE) ×3 IMPLANT
STAPLER VISISTAT 35W (STAPLE) IMPLANT
SUCTION FRAZIER TIP 10 FR DISP (SUCTIONS) ×3 IMPLANT
SUT ETHILON 2 0 FS 18 (SUTURE) ×3 IMPLANT
SUT ETHILON 3 0 FSL (SUTURE) IMPLANT
SUT PDS AB 0 CT 36 (SUTURE) ×3 IMPLANT
SUT PROLENE 3 0 PS 2 (SUTURE) IMPLANT
SUT VIC AB 0 CT1 27 (SUTURE) ×2
SUT VIC AB 0 CT1 27XBRD ANBCTR (SUTURE) ×1 IMPLANT
SUT VIC AB 2-0 CT1 27 (SUTURE)
SUT VIC AB 2-0 CT1 TAPERPNT 27 (SUTURE) IMPLANT
SUT VIC AB 3-0 PS2 18 (SUTURE)
SUT VIC AB 3-0 PS2 18XBRD (SUTURE) IMPLANT
TOWEL OR 17X24 6PK STRL BLUE (TOWEL DISPOSABLE) ×3 IMPLANT
TOWEL OR 17X26 10 PK STRL BLUE (TOWEL DISPOSABLE) ×3 IMPLANT
TUBE CONNECTING 12'X1/4 (SUCTIONS) ×1
TUBE CONNECTING 12X1/4 (SUCTIONS) ×2 IMPLANT
TUBING CYSTO DISP (UROLOGICAL SUPPLIES) ×3 IMPLANT
WATER STERILE IRR 1000ML POUR (IV SOLUTION) ×3 IMPLANT
YANKAUER SUCT BULB TIP NO VENT (SUCTIONS) ×3 IMPLANT

## 2014-10-05 NOTE — Anesthesia Preprocedure Evaluation (Signed)
Anesthesia Evaluation  Patient identified by MRN, date of birth, ID band Patient awake    Reviewed: Allergy & Precautions, H&P , NPO status , Patient's Chart, lab work & pertinent test results  Airway Mallampati: II      Dental   Pulmonary Current Smoker,  breath sounds clear to auscultation        Cardiovascular hypertension, + dysrhythmias Rhythm:Regular Rate:Normal     Neuro/Psych  Headaches,    GI/Hepatic negative GI ROS, Neg liver ROS,   Endo/Other  negative endocrine ROS  Renal/GU negative Renal ROS     Musculoskeletal   Abdominal   Peds  Hematology   Anesthesia Other Findings   Reproductive/Obstetrics                           Anesthesia Physical Anesthesia Plan  ASA: III  Anesthesia Plan: General   Post-op Pain Management:    Induction: Intravenous  Airway Management Planned: LMA  Additional Equipment:   Intra-op Plan:   Post-operative Plan: Extubation in OR  Informed Consent:   Dental advisory given  Plan Discussed with: CRNA, Anesthesiologist and Surgeon  Anesthesia Plan Comments:         Anesthesia Quick Evaluation

## 2014-10-05 NOTE — Brief Op Note (Signed)
10/05/2014  1:05 PM  PATIENT:  Margretta DittyJenny B Engler  33 y.o. female  PRE-OPERATIVE DIAGNOSIS: 1.  Right dorsal foot wound dehiscence      2.  Right peroneus brevis rupture      3.  Right extensor digitorum longus rupture  POST-OPERATIVE DIAGNOSIS:  same  Procedure(s): 1.  Irrigation and debridement of right dorsal foot wound including skin, subcut tissue, muscle and bone   2.  Transfer of peroneus brevis to peroneus longus   3.  Repair of extensor digitorum longus tendon   4.  Application of wound VAC to dorsal foot wound measuring 3 cm x 2 cm x 1.5 cm   SURGEON:  Toni ArthursJohn Eleonor Ocon, MD  ASSISTANT:  Lucretia KernJacquie Allen, PA-C  ANESTHESIA:   General  EBL:  minimal   TOURNIQUET:  40 min at 250 mm Hg  COMPLICATIONS:  None apparent  DISPOSITION:  Extubated, awake and stable to recovery.  DICTATION ID:  [id number]

## 2014-10-05 NOTE — Progress Notes (Signed)
Utilization review completed.  

## 2014-10-05 NOTE — H&P (Signed)
Gina Daniels is an 33 y.o. female.   Chief Complaint:  Right foot wound HPI: 33 y/o female with h/o right dorsal foot laceration a few weeks ago.  She underwent I and D with repair of extensor tendons and peroneus brevis as well as the superficial peroneal nerve.  She has had dehiscence of the wound and failure of at least the peroneal tendon repair.  She presents now for I and D, revision repair and application of a wound VAC.  Past Medical History  Diagnosis Date  . Gallstones   . 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 Depression   . Anxiety   . Anorexia   . PTSD (post-traumatic stress disorder)   . Headache   . Hypertension     Past Surgical History  Procedure Laterality Date  . Tubal ligation  2002  . Wisdom tooth extraction  2002  . Foot surgery      RIGHT   . Chalazion excision      2012 LEFT EYE    Family History  Problem Relation Age of Onset  . Hypertension Mother   . Hypertension Father   . Heart attack Father   . Diabetes Maternal Grandmother    Social History:  reports that she has been smoking Cigarettes.  She has a 10.5 pack-year smoking history. She does not have any smokeless tobacco history on file. She reports that she drinks alcohol. She reports that she does not use illicit drugs.  Allergies:  Allergies  Allergen Reactions  . Latex Anaphylaxis  . Penicillins Anaphylaxis  . Codeine Hives  . Sulfa Antibiotics     hives  . Tape Rash    Medications Prior to Admission  Medication Sig Dispense Refill  . ALPRAZolam (XANAX) 1 MG tablet Take 1 mg by mouth 4 (four) times daily as needed for anxiety.      . diphenhydrAMINE (SOMINEX) 25 MG tablet Take 50 mg by mouth daily.      . hydrochlorothiazide (HYDRODIURIL) 25 MG tablet Take 25 mg by mouth daily.      Marland Kitchen lamoTRIgine (LAMICTAL) 150 MG tablet Take 150 mg by mouth daily.      . Naproxen Sodium (ALEVE) 220 MG CAPS Take 440 mg by mouth 2 (two) times daily as needed.      . traMADol  (ULTRAM) 50 MG tablet Take 50-100 mg by mouth every 6 (six) hours as needed for moderate pain.      . Vilazodone HCl (VIIBRYD) 40 MG TABS Take 40 mg by mouth daily.       Marland Kitchen amphetamine-dextroamphetamine (ADDERALL XR) 20 MG 24 hr capsule Take 20 mg by mouth daily.      Marland Kitchen EPINEPHrine (EPIPEN 2-PAK) 0.3 mg/0.3 mL IJ SOAJ injection Inject 0.3 mg into the muscle once.        Results for orders placed during the hospital encounter of 10/04/14 (from the past 48 hour(s))  CBC     Status: Abnormal   Collection Time    10/04/14 10:53 AM      Result Value Ref Range   WBC 10.4  4.0 - 10.5 K/uL   RBC 4.43  3.87 - 5.11 MIL/uL   Hemoglobin 15.4 (*) 12.0 - 15.0 g/dL   HCT 44.6  36.0 - 46.0 %   MCV 100.7 (*) 78.0 - 100.0 fL   MCH 34.8 (*) 26.0 - 34.0 pg   MCHC 34.5  30.0 - 36.0 g/dL   RDW 12.5  11.5 - 15.5 %   Platelets 272  150 - 400 K/uL  BASIC METABOLIC PANEL     Status: None   Collection Time    10/04/14 10:53 AM      Result Value Ref Range   Sodium 137  137 - 147 mEq/L   Potassium 4.2  3.7 - 5.3 mEq/L   Chloride 97  96 - 112 mEq/L   CO2 28  19 - 32 mEq/L   Glucose, Bld 82  70 - 99 mg/dL   BUN 8  6 - 23 mg/dL   Creatinine, Ser 0.70  0.50 - 1.10 mg/dL   Calcium 9.8  8.4 - 10.5 mg/dL   GFR calc non Af Amer >90  >90 mL/min   GFR calc Af Amer >90  >90 mL/min   Comment: (NOTE)     The eGFR has been calculated using the CKD EPI equation.     This calculation has not been validated in all clinical situations.     eGFR's persistently <90 mL/min signify possible Chronic Kidney     Disease.   Anion gap 12  5 - 15  HCG, SERUM, QUALITATIVE     Status: None   Collection Time    10/04/14 10:53 AM      Result Value Ref Range   Preg, Serum NEGATIVE  NEGATIVE   Comment:            THE SENSITIVITY OF THIS     METHODOLOGY IS >10 mIU/mL.   Dg Chest 2 View  10/04/2014   CLINICAL DATA:  Cardiac dysrhythmia and hypertension  EXAM: CHEST  2 VIEW  COMPARISON:  August 15, 2009  FINDINGS: The lungs are  clear. The heart size and pulmonary vascularity are normal. No adenopathy. There is mild lower thoracic levoscoliosis.  IMPRESSION: No edema or consolidation.   Electronically Signed   By: Lowella Grip M.D.   On: 10/04/2014 14:29    ROS  No recent f/c/n/v/ wt loss  Blood pressure 131/79, pulse 88, temperature 98 F (36.7 C), temperature source Oral, resp. rate 20, height $RemoveBe'5\' 5"'kAcMQnIVM$  (1.651 m), weight 57.465 kg (126 lb 11 oz), SpO2 99.00%. Physical Exam  Thin female in nad.  A and O x 4.  Mood and affect normal.  EOMI.  resp unlabored.  R foot with dorsal laceration that has necrotic wound edges but no purulence.  No active eversion evident at this point.    Toe extension dorsally appears limited too.  Sens to LT distally to the incision is absent.  No lymphadenopathy.    Assessment/Plan R foot dorsal wound dehiscence with recurrent peroneus brevis laceration - to OR for I and D, repair of tendons and application of wound VAC.  The risks and benefits of the alternative treatment options have been discussed in detail.  The patient wishes to proceed with surgery and specifically understands risks of bleeding, infection, nerve damage, blood clots, need for additional surgery, amputation and death.   Gina Daniels 10-08-14, 11:02 AM

## 2014-10-05 NOTE — Op Note (Signed)
NAMCeasar Lund:  ENGLER, Villa                ACCOUNT NO.:  [id number]  MEDICAL RECORD NO.:  [id number]  LOCATION:  [id number]                        FACILITY:  [facility]  PHYSICIAN:  Toni ArthursJohn Zniya Cottone, MD        DATE OF BIRTH:  1981-05-16  DATE OF PROCEDURE:  10/05/2014 DATE OF DISCHARGE:                              OPERATIVE REPORT   POSTOPERATIVE DIAGNOSES: 1. Right dorsal foot wound dehiscence. 2. Right peroneus brevis tendon rupture. 3. Right extensor digitorum longus tendon rupture.  POSTOPERATIVE DIAGNOSES: 1. Right dorsal foot wound dehiscence. 2. Right peroneus brevis tendon rupture. 3. Right extensor digitorum longus tendon rupture.  PROCEDURE: 1. Irrigation and debridement of right dorsal foot wound including     skin, subcutaneous tissue, muscle, and bone. 2. Transfer of peroneus brevis to the peroneus longus. 3. Repair extensor digitorum longus tendon. 4. Application of wound VAC of the dorsal foot wound measuring 3 cm x     2 cm x 1.5 cm.  SURGEON:  Toni ArthursJohn Tamari Busic, M.D.  ASSISTANT:  Janey GreaserJacquie Alan, PA-C.  ANESTHESIA:  General.  ESTIMATED BLOOD LOSS:  Minimal.  TOURNIQUET TIME:  40 minutes at 250 mmHg.  COMPLICATIONS:  None apparent.  DISPOSITION:  Extubated, awake, and stable to recovery.  INDICATIONS FOR PROCEDURE:  The patient is a 33 year old female with past medical history significant for smoking.  Several weeks ago, she suffered a laceration of the dorsal aspect of her foot injuring the peroneus brevis, superficial peroneal nerve, and extensor tendons to the lesser toes.  She underwent I and D and repair of this foot wound.  She then went on to wound dehiscence with rupture of the peroneus brevis tendon repair as well as the extensor digitorum longus tendon repair. She presents now for operative treatment of this wound.  She understands the risks and benefits, the alternative treatment options, and elects surgical treatment.  She specifically understands risks of  bleeding, infection, nerve damage, blood clots, need for additional surgery, continued pain, amputation, and death.  PROCEDURE IN DETAIL:  After preoperative consent was obtained and the correct operative site was identified, the patient was brought to the operating room and placed supine on the operating table.  General anesthesia was induced.  Preoperative antibiotics were administered. Surgical time-out was taken.  Right lower extremity was prepped and draped in standard sterile fashion.  The tourniquet around the thigh. The extremity was exsanguinated and the tourniquet was inflated to 250 mmHg.  The patient's wound dehiscence was identified.  A #15 blade was used to excise the necrotic wound edges circumferentially.  The superficial fibrinous exudate was then excised with a curette and rongeur.  The wound was then explored.  The ruptured ends of the extensor digitorum longus tendons were identified.  The ruptured ends of the peroneus brevis were also identified.  The ends were essentially necrotic and largely liquified.  The necrotic material was sharply excised with scissors.  The wound was then irrigated copiously with 2 L normal saline.  The wound was then explored.  The short extensor tendons to the toes were still intact.  Muscle of the extensor digitorum longus was generally intact though was partially debrided.  The calcaneocuboid joint  appeared generally healthy with no evidence of purulence.  The peroneus brevis tendon ends were freshened, but there was a gap of more than 2 cm between the 2 ends due to necrosis of those tendon ends. Decision was made to transfer the brevis to the longus.  Zero PDS figure- of-eight sutures were used to suture the tendons together proximally and distally holding the ankle in a neutral position.  The long toe extensors were repaired again with 0 PDS figure-of-eight sutures.  The wound was then again irrigated copiously with another liter of  normal saline.  The dorsal medial half of the wound was closed with horizontal mattress sutures of 2-0 nylon.  Laterally, the remaining wound adjacent to the sinus tarsi measured 3 cm long x 2 cm wide x 1.5 cm deep.  Wound VAC sponge was cut to fit this wound.  Adaptic was placed over the incision and a sponge was placed over the Adaptic as an incisional VAC. Occlusive dressings were applied.  The wound VAC was applied at 125 mmHg and appropriate seal was achieved.  Sterile dressings were applied followed by well-padded short-leg splint.  Tourniquet was released at approximately 40 minutes.  The patient was then awakened from anesthesia and transported to the recovery room in stable condition.  FOLLOWUP PLAN:  The patient will be nonweightbearing on the right lower extremity.  She will have 24 hours of IV antibiotics and switch to oral antibiotics.  She will take Lovenox for DVT prophylaxis starting tomorrow.  Lucretia KernJacquie Allen, PA-C was present, scrubbed for the duration of the case. Her assistance was essential in positioning, prepping, and draping, performing the operation, closing and dressing the wounds, and applying the wound VAC and splint.     Toni ArthursJohn Aiyannah Fayad, MD     JH/MEDQ  D:  10/05/2014  T:  10/05/2014  Job:  [id number]

## 2014-10-05 NOTE — Transfer of Care (Signed)
Immediate Anesthesia Transfer of Care Note  Patient: Gina Daniels  Procedure(s) Performed: Procedure(s): INCISION AND DRAINAGE RIGHT FOOT WOUND WITH APPLICATION OF WOUND VAC (Right)  Patient Location: PACU  Anesthesia Type:General  Level of Consciousness: awake and alert   Airway & Oxygen Therapy: Patient Spontanous Breathing  Post-op Assessment: Report given to PACU RN and Post -op Vital signs reviewed and stable  Post vital signs: Reviewed and stable  Complications: No apparent anesthesia complications

## 2014-10-05 NOTE — Anesthesia Postprocedure Evaluation (Signed)
  Anesthesia Post-op Note  Patient: Gina Daniels  Procedure(s) Performed: Procedure(s): INCISION AND DRAINAGE RIGHT FOOT WOUND WITH APPLICATION OF WOUND VAC (Right)  Patient Location: PACU  Anesthesia Type:General  Level of Consciousness: awake  Airway and Oxygen Therapy: Patient Spontanous Breathing  Post-op Pain: mild  Post-op Assessment: Post-op Vital signs reviewed  Post-op Vital Signs: Reviewed  Last Vitals:  Filed Vitals:   10/05/14 1350  BP: 135/85  Pulse: 73  Temp: 36.6 C  Resp: 16    Complications: No apparent anesthesia complications

## 2014-10-06 ENCOUNTER — Encounter (HOSPITAL_COMMUNITY): Payer: Self-pay | Admitting: Orthopedic Surgery

## 2014-10-06 MED ORDER — ENOXAPARIN SODIUM 40 MG/0.4ML ~~LOC~~ SOLN
40.0000 mg | SUBCUTANEOUS | Status: DC
  Administered 2014-10-06 – 2014-10-07 (×2): 40 mg via SUBCUTANEOUS
  Filled 2014-10-06 (×2): qty 0.4

## 2014-10-06 MED ORDER — CEPHALEXIN 250 MG/5ML PO SUSR
500.0000 mg | Freq: Two times a day (BID) | ORAL | Status: DC
  Administered 2014-10-06 – 2014-10-07 (×2): 500 mg via ORAL
  Filled 2014-10-06 (×3): qty 10

## 2014-10-06 MED ORDER — DIPHENHYDRAMINE HCL 25 MG PO CAPS
25.0000 mg | ORAL_CAPSULE | Freq: Four times a day (QID) | ORAL | Status: DC | PRN
  Administered 2014-10-06: 25 mg via ORAL
  Filled 2014-10-06: qty 1

## 2014-10-06 NOTE — Evaluation (Signed)
Physical Therapy Evaluation Patient Details Name: Gina DittyJenny B Engler MRN: [id number] DOB: March 25, 1981 Today's Date: 10/06/2014   History of Present Illness  33 y.o. female s/p INCISION AND DRAINAGE RIGHT FOOT WOUND WITH APPLICATION OF WOUND VAC; Transfer of peroneus brevis to the peroneus longus, and Repair extensor digitorum longus tendon.  Clinical Impression  Patient is seen following the above procedure and presents with functional limitations due to the deficits listed below (see PT Problem List). Able to ambulate up to 75 feet with supervision while using a rolling walker and safely maintains NWB status on RLE at all times. Anticipate pt will progress quickly towards acute PT goals. Plan for stair training at next therapy session. Patient will benefit from skilled PT to increase their independence and safety with mobility to allow discharge to the venue listed below.      Follow Up Recommendations Supervision for mobility/OOB    Equipment Recommendations  Rolling walker with 5" wheels;3in1 (PT)    Recommendations for Other Services OT consult     Precautions / Restrictions Precautions Precautions: Fall Restrictions Weight Bearing Restrictions: Yes RLE Weight Bearing: Non weight bearing      Mobility  Bed Mobility Overal bed mobility: Modified Independent                Transfers Overall transfer level: Needs assistance Equipment used: Rolling walker (2 wheeled) Transfers: Sit to/from Stand Sit to Stand: Supervision         General transfer comment: supervision for safety from lowest bed setting. VC for hand placement. Safely maintains NWB on RLE  Ambulation/Gait Ambulation/Gait assistance: Supervision Ambulation Distance (Feet): 75 Feet Assistive device: Rolling walker (2 wheeled) Gait Pattern/deviations:  ("hop-to" pattern)   Gait velocity interpretation: Below normal speed for age/gender General Gait Details: Educated on safe DME use with a rolling walker.  supervision for safety. No loss of balance noted.  VC for forward gaze and walker placement for smooth step with LLE.  Stairs            Wheelchair Mobility    Modified Rankin (Stroke Patients Only)       Balance Overall balance assessment: Needs assistance;History of Falls Sitting-balance support: No upper extremity supported;Feet supported Sitting balance-Leahy Scale: Good     Standing balance support: Bilateral upper extremity supported Standing balance-Leahy Scale: Poor                               Pertinent Vitals/Pain Pain Assessment: 0-10 Pain Score: 4  Pain Location: Rt foot Pain Descriptors / Indicators: Burning;Throbbing Pain Intervention(s): Monitored during session;Premedicated before session;Repositioned    Home Living Family/patient expects to be discharged to:: Private residence Living Arrangements: Spouse/significant other Available Help at Discharge: Family;Available 24 hours/day Type of Home: Mobile home Home Access: Stairs to enter Entrance Stairs-Rails: Doctor, general practiceight;Left Entrance Stairs-Number of Steps: 2 Home Layout: One level Home Equipment: Crutches      Prior Function Level of Independence: Independent               Hand Dominance   Dominant Hand: Right    Extremity/Trunk Assessment   Upper Extremity Assessment: Defer to OT evaluation           Lower Extremity Assessment: RLE deficits/detail RLE Deficits / Details: sensation in great toe only. Limited assessment due to immobility restrictions (no flex/extension of toes due to tendon grafting).       Communication   Communication: No difficulties  Cognition  Arousal/Alertness: Awake/alert Behavior During Therapy: WFL for tasks assessed/performed Overall Cognitive Status: Within Functional Limits for tasks assessed                      General Comments General comments (skin integrity, edema, etc.): Digits 2-5 on Rt foot mildly dark but warm to touch,  no sensation at baseline. RN aware.    Exercises General Exercises - Lower Extremity Quad Sets: Strengthening;Right;5 reps;Seated Gluteal Sets: Strengthening;Both;5 reps;Seated Hip Flexion/Marching: Strengthening;Right;5 reps;Seated      Assessment/Plan    PT Assessment Patient needs continued PT services  PT Diagnosis Difficulty walking;Abnormality of gait;Acute pain   PT Problem List Decreased activity tolerance;Decreased strength;Decreased range of motion;Decreased balance;Decreased mobility;Decreased knowledge of use of DME;Impaired sensation;Pain  PT Treatment Interventions DME instruction;Gait training;Stair training;Functional mobility training;Therapeutic activities;Therapeutic exercise;Balance training;Neuromuscular re-education;Patient/family education;Modalities   PT Goals (Current goals can be found in the Care Plan section) Acute Rehab PT Goals Patient Stated Goal: Be able to do ballet again PT Goal Formulation: With patient Time For Goal Achievement: 10/13/14 Potential to Achieve Goals: Good    Frequency Min 5X/week   Barriers to discharge        Co-evaluation               End of Session Equipment Utilized During Treatment: Gait belt;Other (comment) (wound vac) Activity Tolerance: Patient tolerated treatment well Patient left: in chair;with call bell/phone within reach Nurse Communication: Mobility status;Other (comment) (digits 2-5 dark but warm on Rt foot)         Time: [id number] PT Time Calculation (min): 31 min   Charges:   PT Evaluation $Initial PT Evaluation Tier I: 1 Procedure PT Treatments $Gait Training: 8-22 mins   PT G Codes:         Charlsie MerlesLogan Secor Grete Bosko, [place] CarolinaPT [phone]  Berton MountBarbour, Shemekia Patane S 10/06/2014, 11:31 AM

## 2014-10-06 NOTE — Progress Notes (Signed)
Subjective: 1 Day Post-Op Procedure(s) (LRB): INCISION AND DRAINAGE RIGHT FOOT WOUND WITH APPLICATION OF WOUND VAC (Right) Patient reports pain as moderate.  Ms. Gina Daniels reports that her pain is now well controlled with her pain medications. She struggled last night to get comfortable. She got up with PT today, and did well but has increased pain since PT. Her appetite has returned and is normal. She has no new concerns at this time. Wound vac still in place and without any issues. She denies any HA, CP, SOB, N, V, fever, chills, calf pain or swelling.   Objective: Vital signs in last 24 hours: Temp:  [97.9 F (36.6 C)-98.5 F (36.9 C)] 98.5 F (36.9 C) (10/14 1248) Pulse Rate:  [70-88] 72 (10/14 1248) Resp:  [12-18] 18 (10/14 1248) BP: (112-149)/(72-99) 149/93 mmHg (10/14 1248) SpO2:  [93 %-100 %] 100 % (10/14 1248)  Intake/Output from previous day: 10/13 0701 - 10/14 0700 In: 2235 [P.O.:720; I.V.:1515] Out: 845 [Urine:800; Drains:35; Blood:10] Intake/Output this shift: Total I/O In: 240 [P.O.:240] Out: -    Recent Labs  10/04/14 1053  HGB 15.4*    Recent Labs  10/04/14 1053  WBC 10.4  RBC 4.43  HCT 44.6  PLT 272    Recent Labs  10/04/14 1053  NA 137  K 4.2  CL 97  CO2 28  BUN 8  CREATININE 0.70  GLUCOSE 82  CALCIUM 9.8   No results found for this basename: LABPT, INR,  in the last 72 hours  WD/WN caucasian female in nad. A and O x4. Mood and affect appropriate. EOMI. Respirations normal and unlabored. Abdomen soft and non-tender. RLE in hard splint with wound vac application. Wound vac draining serosanguinous fluid at 125mmHg. Dressings C/D/I. NV intact with brisk capillary refill and spontaneous movement of great toe extensors and flexors; no movement of lesser toes. Distal sensation intact at great toe. No calf swelling or palpable cords. No lymphadenopathy.    Assessment/Plan: 1 Day Post-Op Procedure(s) (LRB): INCISION AND DRAINAGE RIGHT FOOT WOUND WITH  APPLICATION OF WOUND VAC (Right) Up with therapy; NWB RLE with wound vac in place Wound vac to be changed tomorrow; continue at 125mmHg until then Switch IV ancef to PO keflex Lovenox for DVT prophylaxis Continue pain medications for pain control   Evah Rashid HOWELLS 10/06/2014, 1:13 PM [phone]

## 2014-10-06 NOTE — Consult Note (Addendum)
WOC wound consult note Reason for Consult:Consult requested for Vac change assistance on Thursday.  Plan to meet ortho service on 10/15 when they are available for first post-op dressing change assistance. Wound type:Full thickness surgical Cammie Mcgeeawn Sela Falk MSN, RN, Rober MinionCWOCN, [place]CN-AP, CNS [phone]

## 2014-10-07 ENCOUNTER — Ambulatory Visit: Payer: BC Managed Care – PPO | Admitting: *Deleted

## 2014-10-07 MED ORDER — CEPHALEXIN 250 MG/5ML PO SUSR: 500.0000 mg | Freq: Four times a day (QID) | ORAL | Status: AC

## 2014-10-07 MED ORDER — HYDROCODONE-ACETAMINOPHEN 5-325 MG PO TABS
1.0000 | ORAL_TABLET | ORAL | Status: DC | PRN
  Administered 2014-10-07: 1 via ORAL
  Filled 2014-10-07: qty 2

## 2014-10-07 MED ORDER — HYDROCODONE-ACETAMINOPHEN 5-325 MG PO TABS: 1.0000 | ORAL_TABLET | Freq: Four times a day (QID) | ORAL | Status: DC | PRN

## 2014-10-07 MED ORDER — ASPIRIN EC 325 MG PO TBEC
325.0000 mg | DELAYED_RELEASE_TABLET | Freq: Every day | ORAL | Status: DC
  Administered 2014-10-07: 325 mg via ORAL
  Filled 2014-10-07: qty 1

## 2014-10-07 MED ORDER — NAPROXEN 375 MG PO TABS
375.0000 mg | ORAL_TABLET | Freq: Two times a day (BID) | ORAL | Status: DC
  Administered 2014-10-07: 375 mg via ORAL
  Filled 2014-10-07 (×2): qty 1

## 2014-10-07 MED ORDER — OXYCODONE HCL 5 MG PO TABS
5.0000 mg | ORAL_TABLET | ORAL | Status: DC | PRN
  Administered 2014-10-07: 10 mg via ORAL
  Filled 2014-10-07: qty 2

## 2014-10-07 MED ORDER — ACETAMINOPHEN 325 MG PO TABS: 650.0000 mg | ORAL_TABLET | Freq: Four times a day (QID) | ORAL | Status: DC | PRN

## 2014-10-07 NOTE — Progress Notes (Signed)
Contacted Frederico HammanJacquellyn Allen, [place] and she was unable to reconcile all meds so that AVS could be printed. Copied and pasted AVS information onto word document and gave patient all the discharge instructions that she needed. All questions answered.

## 2014-10-07 NOTE — Care Management Note (Signed)
CARE MANAGEMENT NOTE 10/07/2014  Patient:  Gina Daniels,Gina Daniels   Account Number:  [id number]  Date Initiated:  10/07/2014  Documentation initiated by:  Vance PeperBRADY,Akira Adelsberger  Subjective/Objective Assessment:   33 yr old female admitted with a right foot wound infection. patient underwent I & D of right foot wound with tendon repair and wound vac application.     Action/Plan:   Case manager spoke with patient concerning home health and DME needs. Referral called to Amy,  [facility] Liaison.   Anticipated DC Date:  10/07/2014   Anticipated DC Plan:  HOME W HOME HEALTH SERVICES      DC Planning Services  CM consult      [facility]  HOME HEALTH  DURABLE MEDICAL EQUIPMENT   Choice offered to / List presented to:  C-1 Patient   DME arranged  [facility]  3-N-1  VAC      DME agency  [facility]  [facility]     [facility]  [facility]      [place]  [facility]   Status of service:   Medicare Important Message given?   (If response is "NO", the following Medicare IM given date fields will be blank) Date Medicare IM given:   Medicare IM given by:   Date Additional Medicare IM given:   Additional Medicare IM given by:    Discharge Disposition:  HOME W HOME HEALTH SERVICES  Per UR Regulation:  Reviewed for med. necessity/level of care/duration of stay  If discussed at Long Length of Stay Meetings, dates discussed:    Comments:  10/07/14 3:30pm Vance PeperSusan Theodoro Koval, RN BSN Case Manager [phone] Case manager [facility], that the wound vac dressing is not to be changed until Wednesday Oct.21, 2015. He will do the first change in his office on Monday, Oct. 19th. Patient also given this information.   10/07/14   12N Vance PeperSusan Shaneka Efaw, RN BSN CM faxed wound vac authorization form [facility] awaiting release of vac.

## 2014-10-07 NOTE — Progress Notes (Signed)
I let patient know that Ms. Gina Daniels said that patient should stay overnight so that she could be seen in the morning d/t uncontrolled pain and high blood pressure. Patient and her boyfriend are very upset and threatening to leave AMA.

## 2014-10-07 NOTE — Progress Notes (Signed)
[facility]. Victorino DikeHewitt to notify that patient is in pain of 8/10, BP is 156/102 and patient is requesting Vicodin.

## 2014-10-07 NOTE — Discharge Instructions (Signed)
Gina ArthursJohn Reighlyn Elmes, MD [facility]  Please read the following information regarding your care after surgery.  Medications  You only need a prescription for the narcotic pain medicine (ex. oxycodone, Percocet, Norco).  All of the other medicines listed below are available over the counter. X hydrocodone as prescribed for pain  Narcotic pain medicine (ex. oxycodone, Percocet, Vicodin) will cause constipation.  To prevent this problem, take the following medicines while you are taking any pain medicine. X docusate sodium (Colace) 100 mg twice a day X senna (Senokot) 2 tablets twice a day  X To help prevent blood clots, take an aspirin (325 mg) once a day for a month after surgery.  You should also get up every hour while you are awake to move around.    Weight Bearing X Do not bear any weight on the operated leg or foot.  Cast / Splint / Dressing X Keep your splint or cast clean and dry.  Dont put anything (coat hanger, pencil, etc) down inside of it.  If it gets damp, use a hair dryer on the cool setting to dry it.  If it gets soaked, call the office to schedule an appointment for a cast change.  After your dressing, cast or splint is removed; you may shower, but do not soak or scrub the wound.  Allow the water to run over it, and then gently pat it dry.  Swelling It is normal for you to have swelling where you had surgery.  To reduce swelling and pain, keep your toes above your nose for at least 3 days after surgery.  It may be necessary to keep your foot or leg elevated for several weeks.  If it hurts, it should be elevated.  Follow Up Call my office at [phone] when you are discharged from the hospital or surgery center to schedule an appointment to be seen two weeks after surgery.  Call my office at [phone] if you develop a fever >101.5 F, nausea, vomiting, bleeding from the surgical site or severe pain.

## 2014-10-07 NOTE — Plan of Care (Signed)
Problem: Phase II Progression Outcomes Goal: Surgical site without signs of infection Outcome: Not Applicable Date Met:  10/07/14 Unable to assess d/t dressing     

## 2014-10-07 NOTE — Progress Notes (Signed)
Subjective: 2 Days Post-Op Procedure(s) (LRB): INCISION AND DRAINAGE RIGHT FOOT WOUND WITH APPLICATION OF WOUND VAC (Right) Patient reports pain as moderate.  No n/v/f/c.  Tolerating the VAC well.  Pain reasonably well controlled with oral meds.  Objective: Vital signs in last 24 hours: Temp:  [98.1 F (36.7 C)-98.5 F (36.9 C)] 98.1 F (36.7 C) (10/15 0523) Pulse Rate:  [72-86] 86 (10/15 0523) Resp:  [16-18] 16 (10/15 0523) BP: (121-149)/(80-99) 121/80 mmHg (10/15 0523) SpO2:  [94 %-100 %] 96 % (10/15 0523)  Intake/Output from previous day: 10/14 0701 - 10/15 0700 In: 1560 [P.O.:1320; I.V.:240] Out: 800 [Urine:800] Intake/Output this shift:    No results found for this basename: HGB,  in the last 72 hours No results found for this basename: WBC, RBC, HCT, PLT,  in the last 72 hours No results found for this basename: NA, K, CL, CO2, BUN, CREATININE, GLUCOSE, CALCIUM,  in the last 72 hours No results found for this basename: LABPT, INR,  in the last 72 hours  PE:  Splint removed.  WV changed.  SS drainage that is scant.  No signs of  infection.  Assessment/Plan: 2 Days Post-Op Procedure(s) (LRB): INCISION AND DRAINAGE RIGHT FOOT WOUND WITH APPLICATION OF WOUND VAC (Right) Up with therapy  NWB on R LE.  WV changed today.  Plan next WV change on Monday.  Toni ArthursHEWITT, Evren Shankland 10/07/2014, 11:21 AM

## 2014-10-07 NOTE — Progress Notes (Signed)
Christiana FuchsJacquelyn Allen, PA called back about patient's BP and said to keep patient overnight and that she will assess patient in the morning. For now, she discontinued oxycodone and started hydrocodone per patient request.

## 2014-10-07 NOTE — Progress Notes (Signed)
Physical Therapy Treatment Patient Details Name: Gina Daniels MRN: [id number] DOB: 1981/12/03 Today's Date: 10/07/2014    History of Present Illness 33 y.o. female s/p INCISION AND DRAINAGE RIGHT FOOT WOUND WITH APPLICATION OF WOUND VAC; Transfer of peroneus brevis to the peroneus longus, and Repair extensor digitorum longus tendon.    PT Comments    Pt tolerated therapy session well, ambulating up to 100 feet with a rolling walker and safely maintains NWB status on RLE at all times. Reports her husband has built a ramp and no longer needs to ascend/descend stairs as a goal prior to d/c. She is adequate to return home from a mobility standpoint and will have 24 hour care from family.    Follow Up Recommendations  Supervision for mobility/OOB     Equipment Recommendations  Rolling walker with 5" wheels;3in1 (PT)    Recommendations for Other Services OT consult     Precautions / Restrictions Precautions Precautions: Fall Restrictions Weight Bearing Restrictions: Yes RLE Weight Bearing: Non weight bearing    Mobility  Bed Mobility Overal bed mobility: Modified Independent                Transfers Overall transfer level: Needs assistance Equipment used: Rolling walker (2 wheeled) Transfers: Sit to/from Stand Sit to Stand: Supervision         General transfer comment: Supervision for safety, Good stability upon standing. Correctly places hands on stable surface to rise  Ambulation/Gait Ambulation/Gait assistance: Supervision Ambulation Distance (Feet): 100 Feet Assistive device: Rolling walker (2 wheeled) Gait Pattern/deviations:  ("hop-to" pattern)   Gait velocity interpretation: Below normal speed for age/gender General Gait Details: VC for forward gaze and management of wound vac and walker while ambulating. Pt able to keep NWB status on RLE at all times. No loss of balance and good control with turns.   Stairs Stairs:  (Pt reports husband has built  ramp at home)          Wheelchair Mobility    Modified Rankin (Stroke Patients Only)       Balance                                    Cognition Arousal/Alertness: Awake/alert Behavior During Therapy: WFL for tasks assessed/performed Overall Cognitive Status: Within Functional Limits for tasks assessed                      Exercises General Exercises - Lower Extremity Quad Sets: Strengthening;Right;Seated;10 reps Long Arc Quad: Strengthening;Right;10 reps;Seated Hip ABduction/ADduction: AAROM;Right;10 reps;Seated Straight Leg Raises: Strengthening;Right;10 reps;Seated Hip Flexion/Marching: Strengthening;Right;Seated;10 reps    General Comments        Pertinent Vitals/Pain Pain Assessment: 0-10 Pain Score:  ("feeling pretty good today" no value given) Pain Location: Rt foot Pain Intervention(s): Monitored during session;Repositioned;Premedicated before session    Home Living                      Prior Function            PT Goals (current goals can now be found in the care plan section) Acute Rehab PT Goals PT Goal Formulation: With patient Time For Goal Achievement: 10/13/14 Potential to Achieve Goals: Good Progress towards PT goals: Progressing toward goals    Frequency  Min 5X/week    PT Plan Current plan remains appropriate    Co-evaluation  End of Session Equipment Utilized During Treatment: Gait belt;Other (comment) (wound vac) Activity Tolerance: Patient tolerated treatment well Patient left: in chair;with call bell/phone within reach     Time: 1200-1225 PT Time Calculation (min): 25 min  Charges:  $Gait Training: 8-22 mins $Therapeutic Exercise: 8-22 mins                    G Codes:      [employer] Stefanos Haynesworth, [place] CarolinaPT [phone]  Berton MountBarbour, Vella Colquitt S 10/07/2014, 1:14 PM

## 2014-10-07 NOTE — Progress Notes (Signed)
Paged Teryl LucyJacqueline Allen, PA to notify that patient is very upset about possibly staying until tomorrow morning and threatening to leave AMA. Ms. Freida Busmanllen said that since patient wants to leave today, that is fine if she does.

## 2014-10-07 NOTE — Consult Note (Addendum)
WOC wound consult note Reason for Consult: Ortho service present for first post-op dressing change.   Wound type: Full thickness to right outer foot Measurement: Sutures well-approximated surrounding wound to 5 cm.  Open wound 3X2X.5cm Wound bed: 100% beefy red with small amt visible white tendon Drainage (amount, consistency, odor)  Small amt red drainage, no odor Periwound: Intact skin surrounding Dressing procedure/placement/frequency:  Mepitel contact layer applied, then one piece of black foam into open wound and another piece over incision line to 125mm cont suction.  Pt tolerated well with mod amt discomfort after pain meds given earlier.  Splint and ace wrap applied over Vac dressing.  Plan for patient to be discharged soon and Dr Hewitt will change in the officeVictorino Dike on MON.  Pt awaiting Vac approval and machine delivery. Cammie Mcgeeawn Landon Truax MSN, RN, CWOCN, [place], CNS [phone]

## 2014-10-07 NOTE — Discharge Summary (Signed)
Physician Discharge Summary  Patient ID: Gina Daniels MRN: [id number] DOB/AGE: 1981-05-07 33 y.o.  Admit date: 10/05/2014 Discharge date: 10/07/2014  Admission Diagnoses:  Right foot wound dehiscence after repair of multiple tendon and nerve lacerations  Discharge Diagnoses:  Active Problems:   Postoperative wound dehiscence   Discharged Condition: stable  Hospital Course: The patient was admitted and taken to the OR on 10/13 for I and D of the wound, repair of EDL and peroneus brevis tendons and application of a wound VAC.  She tolerated the procedure well and remained on 5N for her hospital stay.  She had her wound VAC changed on the day of discharge.  She is discharged to home now in stable condition.  Consults: wound RN  Significant Diagnostic Studies: none  Treatments: therapies: PT  Discharge Exam: Blood pressure 121/80, pulse 86, temperature 98.1 F (36.7 C), temperature source Oral, resp. rate 16, height 5\' 5"  (1.651 m), weight 57.465 kg (126 lb 11 oz), SpO2 96.00%. wn wd woman in nad.  R foot with wound VAC in place under splint.  Sens to LT absent on the dorsal foot.  Disposition: 01-Home or Self Care  Discharge Instructions   Call MD / Call 911    Complete by:  As directed   If you experience chest pain or shortness of breath, CALL 911 and be transported to the hospital emergency room.  If you develope a fever above 101 F, pus (white drainage) or increased drainage or redness at the wound, or calf pain, call your surgeon's office.     Constipation Prevention    Complete by:  As directed   Drink plenty of fluids.  Prune juice may be helpful.  You may use a stool softener, such as Colace (over the counter) 100 mg twice a day.  Use MiraLax (over the counter) for constipation as needed.     Diet - low sodium heart healthy    Complete by:  As directed      Increase activity slowly as tolerated    Complete by:  As directed      Non weight bearing    Complete by:  As  directed   Laterality:  right  Extremity:  Lower            Medication List    STOP taking these medications       traMADol 50 MG tablet  Commonly known as:  ULTRAM      TAKE these medications       ALEVE 220 MG Caps  Generic drug:  Naproxen Sodium  Take 440 mg by mouth 2 (two) times daily as needed.     ALPRAZolam 1 MG tablet  Commonly known as:  XANAX  Take 1 mg by mouth 4 (four) times daily as needed for anxiety.     amphetamine-dextroamphetamine 20 MG 24 hr capsule  Commonly known as:  ADDERALL XR  Take 20 mg by mouth daily.     cephALEXin 250 MG/5ML suspension  Commonly known as:  KEFLEX  Take 10 mLs (500 mg total) by mouth 4 (four) times daily.     diphenhydrAMINE 25 MG tablet  Commonly known as:  SOMINEX  Take 50 mg by mouth daily.     EPIPEN 2-PAK 0.3 mg/0.3 mL Soaj injection  Generic drug:  EPINEPHrine  Inject 0.3 mg into the muscle once.     hydrochlorothiazide 25 MG tablet  Commonly known as:  HYDRODIURIL  Take 25 mg by mouth daily.  HYDROcodone-acetaminophen 5-325 MG per tablet  Commonly known as:  NORCO  Take 1 tablet by mouth every 6 (six) hours as needed for moderate pain.     lamoTRIgine 150 MG tablet  Commonly known as:  LAMICTAL  Take 150 mg by mouth daily.     VIIBRYD 40 MG Tabs  Generic drug:  Vilazodone HCl  Take 40 mg by mouth daily.           Follow-up Information   Follow up with Toni ArthursHEWITT, Romin Divita, MD In Monday at 11:00.   Specialty:  Orthopedic Surgery   Contact information:   [address] [place] [id number] [phone]       Signed: Toni ArthursHEWITT, [place]enberg 10/07/2014, 11:34 AM

## 2014-11-24 IMAGING — US US ABDOMEN LIMITED
1 series · 14 of 25 positions shown · non-contrast
Comparison: 03/24/2010 CT scan abdomen.

CLINICAL DATA: Right upper quadrant pain, elevated WBC

EXAM:
US ABDOMEN LIMITED - RIGHT UPPER QUADRANT

[Series 1: us abdomen limited · 0.25mm/px · 14 of 53 slices shown]
[im 1/53]
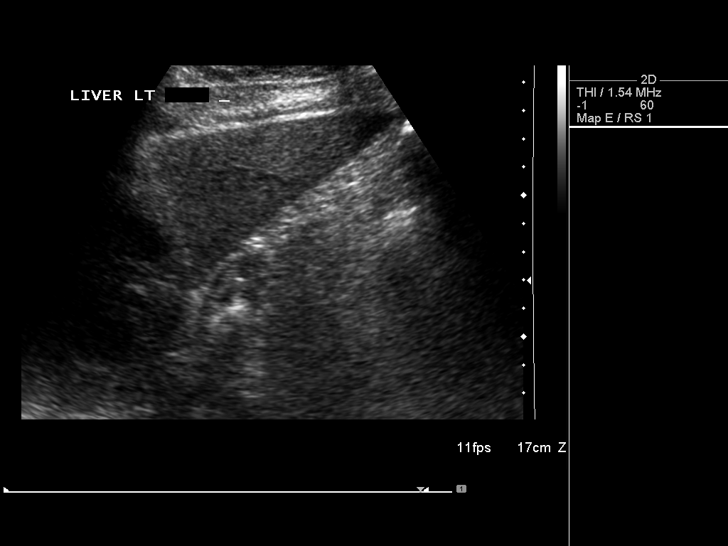
[im 5/53]
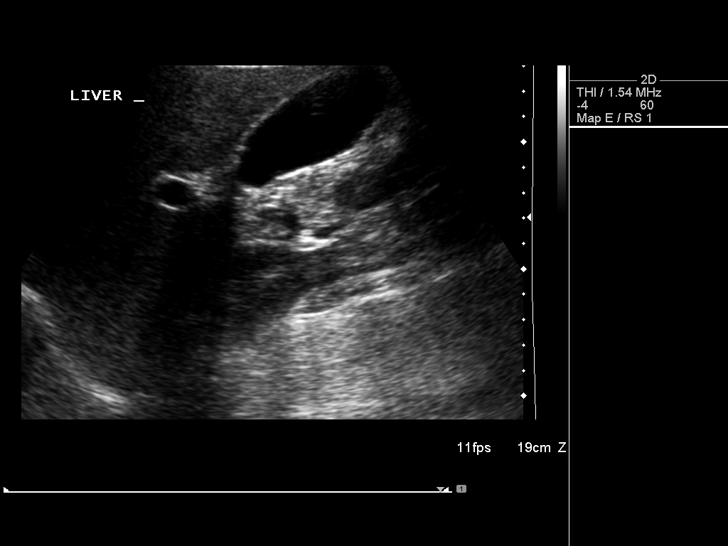
[im 9/53]
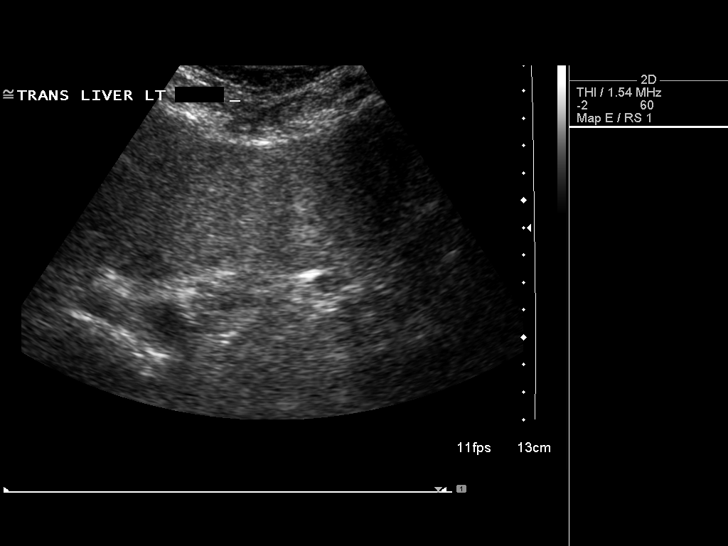
[im 14/53]
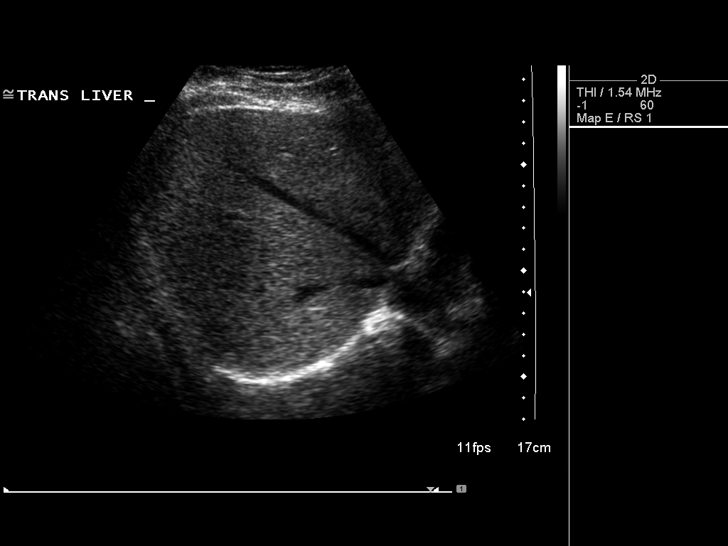
[im 18/53]
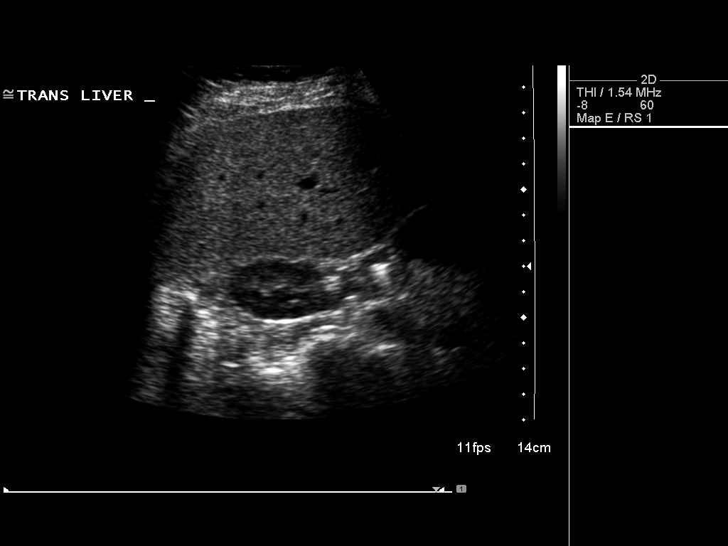
[im 20/53]
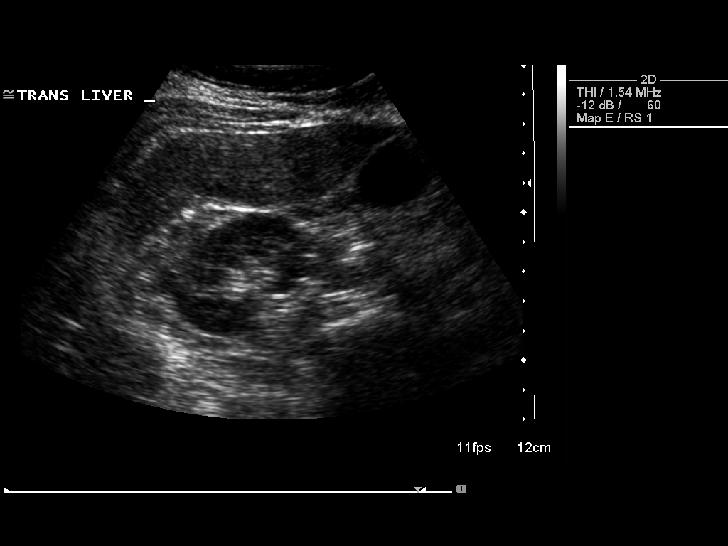
[im 24/53]
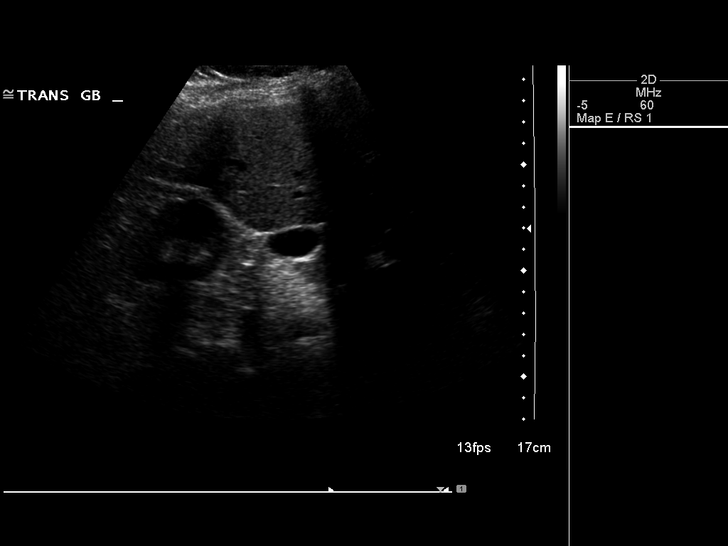
[im 29/53]
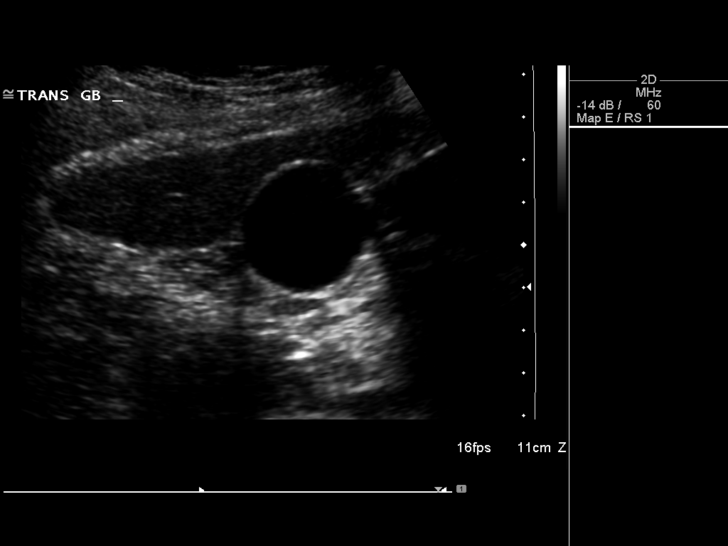
[im 33/53]
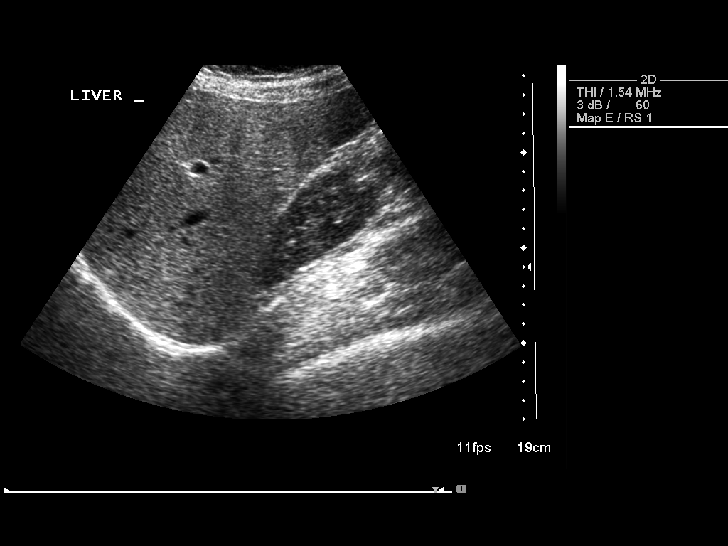
[im 35/53]
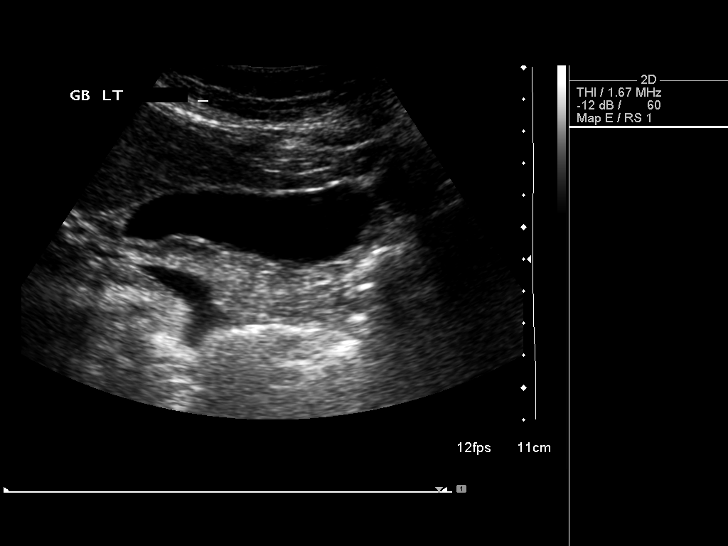
[im 40/53]
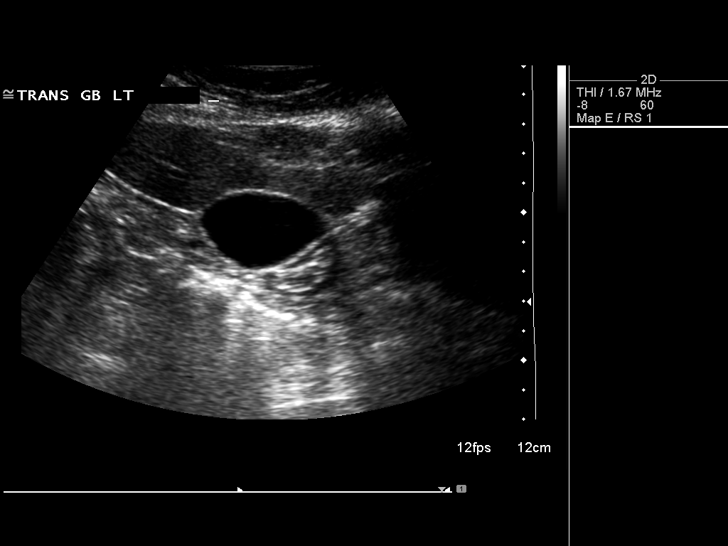
[im 44/53]
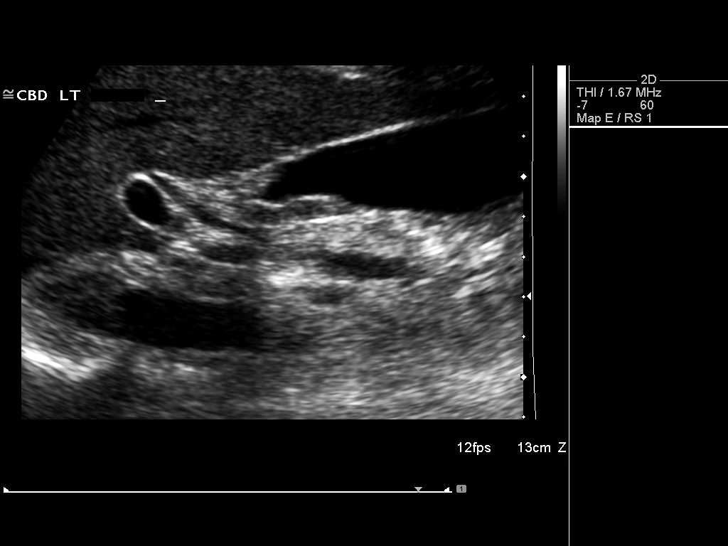
[im 48/53]
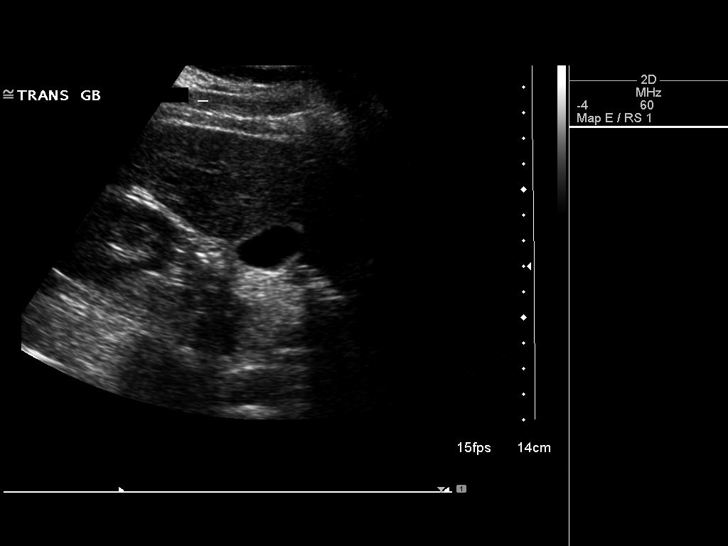
[im 53/53]
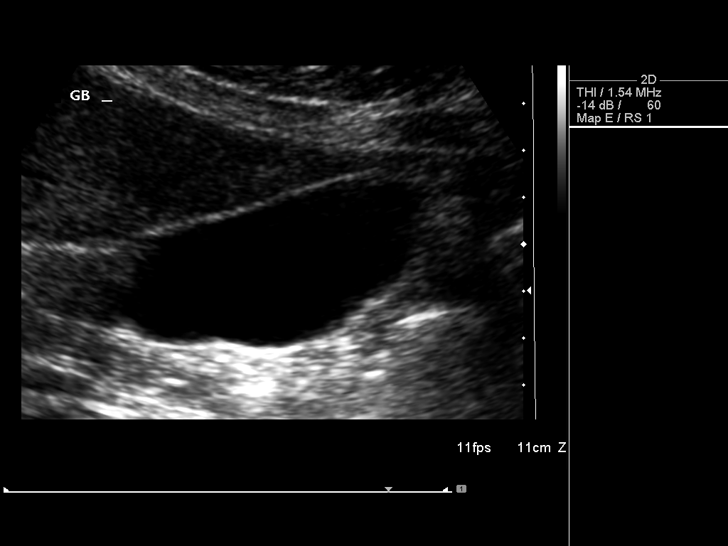

[14 of 25 positions shown; findings below may reference images not displayed]

FINDINGS: Gallbladder:

No gallstones or wall thickening visualized. No sonographic Murphy
sign noted.

Common bile duct:

Diameter: 4 mm in diameter within normal limits.

Liver:

No focal lesion identified. Mild increased images of the liver
suspicious for mild hepatic infiltration. No intrahepatic biliary
ductal dilatation.
IMPRESSION: 1. No gallstones are noted within gallbladder.  Normal CBD.
2. Question mild hepatic fatty infiltration.

## 2015-02-17 ENCOUNTER — Encounter: Payer: BLUE CROSS/BLUE SHIELD | Attending: Nurse Practitioner | Admitting: *Deleted

## 2015-02-17 VITALS — Wt 138.2 lb

## 2015-02-17 DIAGNOSIS — Z713 Dietary counseling and surveillance: Secondary | ICD-10-CM | POA: Insufficient documentation

## 2015-02-17 DIAGNOSIS — F509 Eating disorder, unspecified: Secondary | ICD-10-CM | POA: Insufficient documentation
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
## 2015-02-18 ENCOUNTER — Other Ambulatory Visit: Payer: Self-pay | Admitting: Nurse Practitioner

## 2015-02-23 ENCOUNTER — Telehealth: Payer: Self-pay | Admitting: *Deleted

## 2015-02-23 NOTE — Telephone Encounter (Signed)
Dr. Evelene CroonKaur returned my phone call from earlier today.  She states [name] is very mentally ill and does need a therapist.  Dr. Evelene CroonKaur states Alisson's primary diagnosis is PTSD and suggested [facility] for counseling.  Dr. Evelene CroonKaur is also very alarmed that [name] is planning to get pregnant and feels this is not a good idea

## 2015-02-24 ENCOUNTER — Telehealth: Payer: Self-pay | Admitting: *Deleted

## 2015-02-24 NOTE — Telephone Encounter (Signed)
Passed along Dr. Carie CaddyKaur's recommendation for therapy with Gina LoronBurwell Anthony

## 2015-03-03 ENCOUNTER — Encounter: Payer: BLUE CROSS/BLUE SHIELD | Attending: Nurse Practitioner | Admitting: *Deleted

## 2015-03-03 DIAGNOSIS — Z713 Dietary counseling and surveillance: Secondary | ICD-10-CM | POA: Insufficient documentation

## 2015-03-03 DIAGNOSIS — F509 Eating disorder, unspecified: Secondary | ICD-10-CM | POA: Insufficient documentation

## 2015-03-03 NOTE — Patient Instructions (Signed)
Aim for 3 meals, 2 snacks.  Using exchange exchange Areas for focus: dairy, fruits and veggies, possibly starches Almond milk doesn't count, try soy milk instead

## 2015-03-03 NOTE — Progress Notes (Signed)
Appointment start time: 1400  Appointment end time: 1500  Patient was seen on 03/03/15 for nutrition counseling pertaining to disordered eating  Primary care provider: Delia Chimes, NP Therapist: Griffin Basil Any other medical team members: Dr. Toy Care, psychiatry   Assessment:  Plans again to stop smoking cold [place] on 03/10/15, the day she is getting her tubes untied.  Has met with Griffin Basil.  Sees dr Toy Care Monday.  Started taking pnv in preparation for her pregnancy.  She would like to conceive a child in April.  She has been declared unfit for employment by her psychiatrist.  She wants to get pregnant as a way to recover from her eating disorder and feels that a baby will help her take better care of herself.  Current Weight: 138 lb; BMI: 23  Administered EAT-26 on 02/17/15 Score significant >20 Patient score: 40  Highest weight outside of pregnancy: 160 lb Lowest weight: 94 lb Patient Ideal weight: 125 lb (BMI 20.8)  Mental health diagnosis: AN  Dietary assessment:  Her food variety has increased over the months.  She has more safe foods than fear foods now. Stopped drinking dr pepper on 01/24/15.  Also stopped drinking beer and is trying to quit smoking.  Gina Daniels (husband) is going to stop smoking too and he physically can't drink alcohol anymore due to pancreatitis  Unsafe foods: white breads, white pasta, high fat foods  Typical daily meal pattern: 1-2 meals and 1-2 snacks   Has beeneating more fish and chicken.   Got [employer] card and has stocked up.  She is trying to eat more.  She is not happy with her weight and is wearing a wasite-trainer.  She wants to weigh 135.  Feels like her husband eats a lot and she eats the same as him.  Has 3 meals consistently.   And 2 snacks.  She likes that pattern, just wants to make better choices.  Weighs her foods Smokes pot at night to help with nightmares- is weaning medication and these should subside Took some online quiz to learn she's a  "carbaphobe"  24 hour recall: B: carnation instant breakfast with protein powder added.  Made with almond milk Apple nutrigrain bar 2 slices ww bread with brown sugar cinnamon butter Salmon and slices tomatos and avocado Bowl honey nut cheerios Beverages: gatorade, cran apple juice, cucumber water  Felt this was adequate   Exercise: physical therapy 3 times/week (exercise bike 10 minutes).  Got exercise bike and elliptical machine at home   Compensatory behaviors           Restricting (calories, fat, carbs): yes  SIV: denies  Diet pills/Laxatives/ diuretics: daily  Alcohol or drugs: stopped alcohol  Exercise: once a month or less  Binge: once a month or less  Estimated energy intake: 1200-1400 kcal  Estimated energy needs: 1800-2000 kcal   Nutrition Diagnosis: NB-1.2 Harmful beliefs/attitudes about food or nutrition-related topics. As related to eating disorder.  As evidenced by calorie restricted dietary recall.  Intervention/Goals: stressed importance of balanced meals and snacks.  Discussed dietary exchange system and developed meal plan  Meal plan: 3 meals    2 snacks To provide 1800 kcal 225 g carb    90 g protein     60g fat  #exchanges: 3 dairy 3 fruit 3-4 veg 8 starch 5 protein 6 fat  Monitoring and Evaluation: Patient will follow up in 2 weeks.

## 2015-03-16 ENCOUNTER — Other Ambulatory Visit: Payer: Self-pay | Admitting: Nurse Practitioner

## 2015-03-16 DIAGNOSIS — N644 Mastodynia: Secondary | ICD-10-CM

## 2015-03-18 ENCOUNTER — Ambulatory Visit: Payer: BLUE CROSS/BLUE SHIELD | Admitting: *Deleted

## 2015-04-05 ENCOUNTER — Ambulatory Visit: Payer: BLUE CROSS/BLUE SHIELD | Admitting: *Deleted

## 2015-04-19 ENCOUNTER — Ambulatory Visit: Payer: BLUE CROSS/BLUE SHIELD | Admitting: *Deleted

## 2015-04-30 ENCOUNTER — Emergency Department (HOSPITAL_COMMUNITY)
Admission: EM | Admit: 2015-04-30 | Discharge: 2015-04-30 | Disposition: A | Discharge status: Home / Self Care | Payer: BLUE CROSS/BLUE SHIELD | Attending: Emergency Medicine | Admitting: Emergency Medicine

## 2015-04-30 ENCOUNTER — Encounter (HOSPITAL_COMMUNITY): Payer: Self-pay | Admitting: Emergency Medicine

## 2015-04-30 DIAGNOSIS — I10 Essential (primary) hypertension: Secondary | ICD-10-CM | POA: Insufficient documentation

## 2015-04-30 DIAGNOSIS — R197 Diarrhea, unspecified: Secondary | ICD-10-CM | POA: Insufficient documentation

## 2015-04-30 DIAGNOSIS — F329 Major depressive disorder, single episode, unspecified: Secondary | ICD-10-CM | POA: Diagnosis not present

## 2015-04-30 DIAGNOSIS — M542 Cervicalgia: Secondary | ICD-10-CM | POA: Insufficient documentation

## 2015-04-30 DIAGNOSIS — Z720 Tobacco use: Secondary | ICD-10-CM | POA: Diagnosis not present

## 2015-04-30 DIAGNOSIS — M549 Dorsalgia, unspecified: Secondary | ICD-10-CM | POA: Insufficient documentation

## 2015-04-30 DIAGNOSIS — Z79899 Other long term (current) drug therapy: Secondary | ICD-10-CM | POA: Diagnosis not present

## 2015-04-30 DIAGNOSIS — Z791 Long term (current) use of non-steroidal anti-inflammatories (NSAID): Secondary | ICD-10-CM | POA: Insufficient documentation

## 2015-04-30 DIAGNOSIS — F419 Anxiety disorder, unspecified: Secondary | ICD-10-CM | POA: Insufficient documentation

## 2015-04-30 DIAGNOSIS — R509 Fever, unspecified: Secondary | ICD-10-CM | POA: Diagnosis not present

## 2015-04-30 DIAGNOSIS — R112 Nausea with vomiting, unspecified: Secondary | ICD-10-CM | POA: Insufficient documentation

## 2015-04-30 DIAGNOSIS — M791 Myalgia, unspecified site: Secondary | ICD-10-CM

## 2015-04-30 DIAGNOSIS — Z880 Allergy status to penicillin: Secondary | ICD-10-CM | POA: Diagnosis not present

## 2015-04-30 DIAGNOSIS — F4310 Post-traumatic stress disorder, unspecified: Secondary | ICD-10-CM | POA: Insufficient documentation

## 2015-04-30 DIAGNOSIS — Z91040 Latex allergy status: Secondary | ICD-10-CM | POA: Diagnosis not present

## 2015-04-30 DIAGNOSIS — Z3202 Encounter for pregnancy test, result negative: Secondary | ICD-10-CM | POA: Insufficient documentation

## 2015-04-30 DIAGNOSIS — Z8719 Personal history of other diseases of the digestive system: Secondary | ICD-10-CM | POA: Diagnosis not present

## 2015-04-30 HISTORY — DX: Anorexia nervosa, unspecified: F50.00

## 2015-04-30 LAB — COMPREHENSIVE METABOLIC PANEL
ALT: 15 U/L (ref 14–54)
ANION GAP: 13 (ref 5–15)
AST: 17 U/L (ref 15–41)
Albumin: 3.5 g/dL (ref 3.5–5.0)
Alkaline Phosphatase: 45 U/L (ref 38–126)
BUN: <5 mg/dL — ABNORMAL LOW (ref 6–20)
CALCIUM: 8.4 mg/dL — AB (ref 8.9–10.3)
CO2: 20 mmol/L — ABNORMAL LOW (ref 22–32)
Chloride: 107 mmol/L (ref 101–111)
Creatinine, Ser: 0.74 mg/dL (ref 0.44–1.00)
GLUCOSE: 123 mg/dL — AB (ref 70–99)
Potassium: 3.4 mmol/L — ABNORMAL LOW (ref 3.5–5.1)
SODIUM: 140 mmol/L (ref 135–145)
Total Bilirubin: 0.4 mg/dL (ref 0.3–1.2)
Total Protein: 5.9 g/dL — ABNORMAL LOW (ref 6.5–8.1)

## 2015-04-30 LAB — CBC WITH DIFFERENTIAL/PLATELET
BASOS PCT: 0 % (ref 0–1)
Basophils Absolute: 0 10*3/uL (ref 0.0–0.1)
EOS PCT: 4 % (ref 0–5)
Eosinophils Absolute: 0.3 10*3/uL (ref 0.0–0.7)
HEMATOCRIT: 37.3 % (ref 36.0–46.0)
HEMOGLOBIN: 12.8 g/dL (ref 12.0–15.0)
LYMPHS ABS: 2.9 10*3/uL (ref 0.7–4.0)
Lymphocytes Relative: 36 % (ref 12–46)
MCH: 33.4 pg (ref 26.0–34.0)
MCHC: 34.3 g/dL (ref 30.0–36.0)
MCV: 97.4 fL (ref 78.0–100.0)
MONOS PCT: 7 % (ref 3–12)
Monocytes Absolute: 0.5 10*3/uL (ref 0.1–1.0)
Neutro Abs: 4.3 10*3/uL (ref 1.7–7.7)
Neutrophils Relative %: 53 % (ref 43–77)
Platelets: 174 10*3/uL (ref 150–400)
RBC: 3.83 MIL/uL — AB (ref 3.87–5.11)
RDW: 12.6 % (ref 11.5–15.5)
WBC: 8.1 10*3/uL (ref 4.0–10.5)

## 2015-04-30 LAB — URINALYSIS, ROUTINE W REFLEX MICROSCOPIC
BILIRUBIN URINE: NEGATIVE
Glucose, UA: NEGATIVE mg/dL
Hgb urine dipstick: NEGATIVE
Ketones, ur: NEGATIVE mg/dL
LEUKOCYTES UA: NEGATIVE
Nitrite: NEGATIVE
PH: 5 (ref 5.0–8.0)
Protein, ur: NEGATIVE mg/dL
SPECIFIC GRAVITY, URINE: 1.003 — AB (ref 1.005–1.030)
Urobilinogen, UA: 0.2 mg/dL (ref 0.0–1.0)

## 2015-04-30 LAB — I-STAT CG4 LACTIC ACID, ED: Lactic Acid, Venous: 2.25 mmol/L (ref 0.5–2.0)

## 2015-04-30 LAB — POC URINE PREG, ED: Preg Test, Ur: NEGATIVE

## 2015-04-30 MED ORDER — CYCLOBENZAPRINE HCL 10 MG PO TABS: 10.0000 mg | ORAL_TABLET | Freq: Two times a day (BID) | ORAL | Status: DC | PRN

## 2015-04-30 MED ORDER — SODIUM CHLORIDE 0.9 % IV SOLN
1000.0000 mL | Freq: Once | INTRAVENOUS | Status: AC
  Administered 2015-04-30: 1000 mL via INTRAVENOUS

## 2015-04-30 MED ORDER — METOCLOPRAMIDE HCL 5 MG/ML IJ SOLN
10.0000 mg | Freq: Once | INTRAMUSCULAR | Status: AC
  Administered 2015-04-30: 10 mg via INTRAVENOUS
  Filled 2015-04-30: qty 2

## 2015-04-30 MED ORDER — DIPHENHYDRAMINE HCL 50 MG/ML IJ SOLN
25.0000 mg | Freq: Once | INTRAMUSCULAR | Status: AC
  Administered 2015-04-30: 25 mg via INTRAVENOUS
  Filled 2015-04-30: qty 1

## 2015-04-30 MED ORDER — SODIUM CHLORIDE 0.9 % IV SOLN
1000.0000 mL | INTRAVENOUS | Status: DC
  Administered 2015-04-30: 1000 mL via INTRAVENOUS

## 2015-04-30 MED ORDER — METOCLOPRAMIDE HCL 10 MG PO TABS: 10.0000 mg | ORAL_TABLET | Freq: Four times a day (QID) | ORAL | Status: DC | PRN

## 2015-04-30 NOTE — Discharge Instructions (Signed)
Take loperamide (Imodium AD) as needed for diarrhea. Take acetaminophen as needed for pain.  Return if symptoms are getting worse.  Nausea and Vomiting Nausea is a sick feeling that often comes before throwing up (vomiting). Vomiting is a reflex where stomach contents come out of your mouth. Vomiting can cause severe loss of body fluids (dehydration). Children and elderly adults can become dehydrated quickly, especially if they also have diarrhea. Nausea and vomiting are symptoms of a condition or disease. It is important to find the cause of your symptoms. CAUSES   Direct irritation of the stomach lining. This irritation can result from increased acid production (gastroesophageal reflux disease), infection, food poisoning, taking certain medicines (such as nonsteroidal anti-inflammatory drugs), alcohol use, or tobacco use.  Signals from the brain.These signals could be caused by a headache, heat exposure, an inner ear disturbance, increased pressure in the brain from injury, infection, a tumor, or a concussion, pain, emotional stimulus, or metabolic problems.  An obstruction in the gastrointestinal tract (bowel obstruction).  Illnesses such as diabetes, hepatitis, gallbladder problems, appendicitis, kidney problems, cancer, sepsis, atypical symptoms of a heart attack, or eating disorders.  Medical treatments such as chemotherapy and radiation.  Receiving medicine that makes you sleep (general anesthetic) during surgery. DIAGNOSIS Your caregiver may ask for tests to be done if the problems do not improve after a few days. Tests may also be done if symptoms are severe or if the reason for the nausea and vomiting is not clear. Tests may include:  Urine tests.  Blood tests.  Stool tests.  Cultures (to look for evidence of infection).  X-rays or other imaging studies. Test results can help your caregiver make decisions about treatment or the need for additional tests. TREATMENT You need  to stay well hydrated. Drink frequently but in small amounts.You may wish to drink water, sports drinks, clear broth, or eat frozen ice pops or gelatin dessert to help stay hydrated.When you eat, eating slowly may help prevent nausea.There are also some antinausea medicines that may help prevent nausea. HOME CARE INSTRUCTIONS   Take all medicine as directed by your caregiver.  If you do not have an appetite, do not force yourself to eat. However, you must continue to drink fluids.  If you have an appetite, eat a normal diet unless your caregiver tells you differently.  Eat a variety of complex carbohydrates (rice, wheat, potatoes, bread), lean meats, yogurt, fruits, and vegetables.  Avoid high-fat foods because they are more difficult to digest.  Drink enough water and fluids to keep your urine clear or pale yellow.  If you are dehydrated, ask your caregiver for specific rehydration instructions. Signs of dehydration may include:  Severe thirst.  Dry lips and mouth.  Dizziness.  Dark urine.  Decreasing urine frequency and amount.  Confusion.  Rapid breathing or pulse. SEEK IMMEDIATE MEDICAL CARE IF:   You have blood or brown flecks (like coffee grounds) in your vomit.  You have black or bloody stools.  You have a severe headache or stiff neck.  You are confused.  You have severe abdominal pain.  You have chest pain or trouble breathing.  You do not urinate at least once every 8 hours.  You develop cold or clammy skin.  You continue to vomit for longer than 24 to 48 hours.  You have a fever. MAKE SURE YOU:   Understand these instructions.  Will watch your condition.  Will get help right away if you are not doing well or  get worse. Document Released: 12/10/2005 Document Revised: 03/03/2012 Document Reviewed: 05/09/2011 [facility] Patient Information 2015 [place]. This information is not intended to replace advice given to you by your health care  provider. Make sure you discuss any questions you have with your health care provider.  Diarrhea Diarrhea is frequent loose and watery bowel movements. It can cause you to feel weak and dehydrated. Dehydration can cause you to become tired and thirsty, have a dry mouth, and have decreased urination that often is dark yellow. Diarrhea is a sign of another problem, most often an infection that will not last long. In most cases, diarrhea typically lasts 2-3 days. However, it can last longer if it is a sign of something more serious. It is important to treat your diarrhea as directed by your caregiver to lessen or prevent future episodes of diarrhea. CAUSES  Some common causes include:  Gastrointestinal infections caused by viruses, bacteria, or parasites.  Food poisoning or food allergies.  Certain medicines, such as antibiotics, chemotherapy, and laxatives.  Artificial sweeteners and fructose.  Digestive disorders. HOME CARE INSTRUCTIONS  Ensure adequate fluid intake (hydration): Have 1 cup (8 oz) of fluid for each diarrhea episode. Avoid fluids that contain simple sugars or sports drinks, fruit juices, whole milk products, and sodas. Your urine should be clear or pale yellow if you are drinking enough fluids. Hydrate with an oral rehydration solution that you can purchase at pharmacies, retail stores, and online. You can prepare an oral rehydration solution at home by mixing the following ingredients together:   - tsp table salt.   tsp baking soda.   tsp salt substitute containing potassium chloride.  1  tablespoons sugar.  1 L (34 oz) of water.  Certain foods and beverages may increase the speed at which food moves through the gastrointestinal (GI) tract. These foods and beverages should be avoided and include:  Caffeinated and alcoholic beverages.  High-fiber foods, such as raw fruits and vegetables, nuts, seeds, and whole grain breads and cereals.  Foods and beverages sweetened  with sugar alcohols, such as xylitol, sorbitol, and mannitol.  Some foods may be well tolerated and may help thicken stool including:  Starchy foods, such as rice, toast, pasta, low-sugar cereal, oatmeal, grits, baked potatoes, crackers, and bagels.  Bananas.  Applesauce.  Add probiotic-rich foods to help increase healthy bacteria in the GI tract, such as yogurt and fermented milk products.  Wash your hands well after each diarrhea episode.  Only take over-the-counter or prescription medicines as directed by your caregiver.  Take a warm bath to relieve any burning or pain from frequent diarrhea episodes. SEEK IMMEDIATE MEDICAL CARE IF:   You are unable to keep fluids down.  You have persistent vomiting.  You have blood in your stool, or your stools are black and tarry.  You do not urinate in 6-8 hours, or there is only a small amount of very dark urine.  You have abdominal pain that increases or localizes.  You have weakness, dizziness, confusion, or light-headedness.  You have a severe headache.  Your diarrhea gets worse or does not get better.  You have a fever or persistent symptoms for more than 2-3 days.  You have a fever and your symptoms suddenly get worse. MAKE SURE YOU:   Understand these instructions.  Will watch your condition.  Will get help right away if you are not doing well or get worse. Document Released: 11/30/2002 Document Revised: 04/26/2014 Document Reviewed: 08/17/2012 Exit[employer] Patient Information 2015  [employer]. This information is not intended to replace advice given to you by your health care provider. Make sure you discuss any questions you have with your health care provider.

## 2015-04-30 NOTE — ED Notes (Signed)
Pt. arrived with EMS from home reports persistent nausea and vomitting onset this week , received Zoframn 4 mg IV by EMS with slight relief , pt. also reported upper back pain / stiffness for several days , denies injury or fall.

## 2015-04-30 NOTE — ED Provider Notes (Signed)
CSN: [id number]     Arrival date & time 04/30/15  0005 History   First MD Initiated Contact with Patient 04/30/15 0017     This chart was scribed for Dione Boozeavid Darnesha Diloreto, MD by Arlan OrganAshley Leger, ED Scribe. This patient was seen in room A02C/A02C and the patient's care was started 12:21 AM.   Chief Complaint  Patient presents with  . Emesis   The history is provided by the patient. No language interpreter was used.    HPI Comments: Gina Daniels brought in by EMS is a 34 y.o. female with a PMHx of gallstones and HTN who presents to the Emergency Department complaining of constant, ongoing, gradually worsening generalized pain to neck, back, shoulder bilaterally, and hips bilaterally x 2-3 days. Pain is described as sharp. She also reports nausea, vomiting, diarrhea, and a fever or 102 yesterday that has now resolved. Pt given Zofran 4 mg IV en route to ED with mild improvement. However, now OTC medications or home remedies attempted at home prior to arrival. No recent injury or fall. No recent known sick contacts. Pt with known allergies to Penicillins, Codeine, and Sulfa Antibiotics.  Past Medical History  Diagnosis Date  . Gallstones   . 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 Depression   . Anxiety   . Anorexia   . PTSD (post-traumatic stress disorder)   . Headache   . Hypertension   . Anorexia nervosa    Past Surgical History  Procedure Laterality Date  . Tubal ligation  2002  . Wisdom tooth extraction  2002  . Foot surgery      RIGHT   . Chalazion excision      2012 LEFT EYE  . Incision and drainage Right 10/05/2014    Procedure: INCISION AND DRAINAGE RIGHT FOOT WOUND WITH APPLICATION OF WOUND VAC;  Surgeon: Toni ArthursJohn Hewitt, MD;  Location: [facility] OR;  Service: Orthopedics;  Laterality: Right;   Family History  Problem Relation Age of Onset  . Hypertension Mother   . Hypertension Father   . Heart attack Father   . Diabetes Maternal Grandmother    History  Substance  Use Topics  . Smoking status: Current Every Day Smoker -- 0.50 packs/day for 21 years    Types: Cigarettes  . Smokeless tobacco: Not on file  . Alcohol Use: Yes     Comment: OCC   OB History    No data available     Review of Systems  Constitutional: Positive for fever, chills and diaphoresis.  Gastrointestinal: Positive for nausea, vomiting and diarrhea.  Musculoskeletal: Positive for myalgias, back pain, arthralgias and neck pain.  Skin: Negative for rash.  Psychiatric/Behavioral: Negative for confusion.      Allergies  Latex; Penicillins; Codeine; Sulfa antibiotics; and Tape  Home Medications   Prior to Admission medications   Medication Sig Start Date End Date Taking? Authorizing Provider  ALPRAZolam Prudy Feeler(XANAX) 1 MG tablet Take 1 mg by mouth 4 (four) times daily as needed for anxiety.    Historical Provider, MD  amphetamine-dextroamphetamine (ADDERALL XR) 20 MG 24 hr capsule Take 20 mg by mouth daily.    Historical Provider, MD  diphenhydrAMINE (SOMINEX) 25 MG tablet Take 50 mg by mouth daily.    Historical Provider, MD  EPINEPHrine (EPIPEN 2-PAK) 0.3 mg/0.3 mL IJ SOAJ injection Inject 0.3 mg into the muscle once.    Historical Provider, MD  hydrochlorothiazide (HYDRODIURIL) 25 MG tablet Take 25 mg by mouth daily.  Historical Provider, MD  HYDROcodone-acetaminophen (NORCO) 5-325 MG per tablet Take 1 tablet by mouth every 6 (six) hours as needed for moderate pain. 10/07/14   Toni ArthursJohn Hewitt, MD  lamoTRIgine (LAMICTAL) 150 MG tablet Take 150 mg by mouth daily.    Historical Provider, MD  Naproxen Sodium (ALEVE) 220 MG CAPS Take 440 mg by mouth 2 (two) times daily as needed.    Historical Provider, MD  Vilazodone HCl (VIIBRYD) 40 MG TABS Take 40 mg by mouth daily.     Historical Provider, MD   Triage Vitals: BP 119/69 mmHg  Pulse 102  Temp(Src) 98.8 F (37.1 C) (Oral)  Resp 16  SpO2 94%   Physical Exam  Constitutional: She is oriented to person, place, and time. She appears  well-developed and well-nourished. No distress.  Appears uncomfortable   HENT:  Head: Normocephalic and atraumatic.  Mouth/Throat: Oropharynx is clear and moist.  Eyes: EOM are normal. Pupils are equal, round, and reactive to light.  Neck: No JVD present.  Moderate bilateral paracervical spasm and tenderness  Pain with passive and active ROM of neck but able to fully flex Negative Kernig's and Brudzinski signs   Cardiovascular: Normal rate, regular rhythm and normal heart sounds.   No murmur heard. Pulmonary/Chest: Effort normal and breath sounds normal. She has no wheezes. She has no rales. She exhibits no tenderness.  Abdominal: Soft. She exhibits no distension and no mass. There is no tenderness.  Bowel sounds are decreased   Musculoskeletal: Normal range of motion. She exhibits no edema.  Lymphadenopathy:    She has no cervical adenopathy.  Neurological: She is alert and oriented to person, place, and time. No cranial nerve deficit. She exhibits normal muscle tone. Coordination normal.  Skin: Skin is warm and dry. No rash noted.  Psychiatric: She has a normal mood and affect. Judgment normal.  Nursing note and vitals reviewed.   ED Course  Procedures (including critical care time)  DIAGNOSTIC STUDIES: Oxygen Saturation is 94% on RA, adequate by my interpretation.    COORDINATION OF CARE: 12:25 AM- Will give fluids, Reglan, and Benadryl. Will order CMP, i-stat CG4 lactic acid, CBC, urinalysis, and urine preg. Discussed treatment plan with pt at bedside and pt agreed to plan.     Labs Review Results for orders placed or performed during the hospital encounter of 04/30/15  Comprehensive metabolic panel  Result Value Ref Range   Sodium 140 135 - 145 mmol/L   Potassium 3.4 (L) 3.5 - 5.1 mmol/L   Chloride 107 101 - 111 mmol/L   CO2 20 (L) 22 - 32 mmol/L   Glucose, Bld 123 (H) 70 - 99 mg/dL   BUN <5 (L) 6 - 20 mg/dL   Creatinine, Ser 1.470.74 0.44 - 1.00 mg/dL   Calcium 8.4 (L)  8.9 - 10.3 mg/dL   Total Protein 5.9 (L) 6.5 - 8.1 g/dL   Albumin 3.5 3.5 - 5.0 g/dL   AST 17 15 - 41 U/L   ALT 15 14 - 54 U/L   Alkaline Phosphatase 45 38 - 126 U/L   Total Bilirubin 0.4 0.3 - 1.2 mg/dL   GFR calc non Af Amer >60 >60 mL/min   GFR calc Af Amer >60 >60 mL/min   Anion gap 13 5 - 15  CBC with Differential  Result Value Ref Range   WBC 8.1 4.0 - 10.5 K/uL   RBC 3.83 (L) 3.87 - 5.11 MIL/uL   Hemoglobin 12.8 12.0 - 15.0 g/dL   HCT  37.3 36.0 - 46.0 %   MCV 97.4 78.0 - 100.0 fL   MCH 33.4 26.0 - 34.0 pg   MCHC 34.3 30.0 - 36.0 g/dL   RDW 04.5 40.9 - 81.1 %   Platelets 174 150 - 400 K/uL   Neutrophils Relative % 53 43 - 77 %   Neutro Abs 4.3 1.7 - 7.7 K/uL   Lymphocytes Relative 36 12 - 46 %   Lymphs Abs 2.9 0.7 - 4.0 K/uL   Monocytes Relative 7 3 - 12 %   Monocytes Absolute 0.5 0.1 - 1.0 K/uL   Eosinophils Relative 4 0 - 5 %   Eosinophils Absolute 0.3 0.0 - 0.7 K/uL   Basophils Relative 0 0 - 1 %   Basophils Absolute 0.0 0.0 - 0.1 K/uL  Urinalysis, Routine w reflex microscopic  Result Value Ref Range   Color, Urine YELLOW YELLOW   APPearance CLEAR CLEAR   Specific Gravity, Urine 1.003 (L) 1.005 - 1.030   pH 5.0 5.0 - 8.0   Glucose, UA NEGATIVE NEGATIVE mg/dL   Hgb urine dipstick NEGATIVE NEGATIVE   Bilirubin Urine NEGATIVE NEGATIVE   Ketones, ur NEGATIVE NEGATIVE mg/dL   Protein, ur NEGATIVE NEGATIVE mg/dL   Urobilinogen, UA 0.2 0.0 - 1.0 mg/dL   Nitrite NEGATIVE NEGATIVE   Leukocytes, UA NEGATIVE NEGATIVE  I-Stat CG4 Lactic Acid, ED  Result Value Ref Range   Lactic Acid, Venous 2.25 (HH) 0.5 - 2.0 mmol/L   Comment NOTIFIED PHYSICIAN   POC urine preg, ED  Result Value Ref Range   Preg Test, Ur NEGATIVE NEGATIVE     MDM   Final diagnoses:  Nausea vomiting and diarrhea  Myalgia  Fever, unspecified fever cause    Fever, vomiting, diarrhea and aching in multiple body parts. This is consistent with a viral illness. Patient does not appear toxic, but  she is clearly uncomfortable. Although there is no pain, there is no meningismus. She was given IV fluids, IV metoclopramide, IV diphenhydramine and find this has fallen asleep. Upon wakening, she still has some aching but she has much better head movement and is feeling considerably better. Laboratory workup did show slightly elevated lactic acid level of uncertain significance. There is no leukocytosis or left shift. Serum CO2 is borderline low but with normal anion gap. She is discharged with prescription for metoclopramide and cyclobenzaprine and is advised to use acetaminophen for pain. Follow-up with PCP in 2 days but return to ED if symptoms are worsening.  I personally performed the services described in this documentation, which was scribed in my presence. The recorded information has been reviewed and is accurate.     Dione Booze, MD 04/30/15 [phone]

## 2015-04-30 NOTE — ED Notes (Signed)
EDP notified on pt.'s and family request to speak with him for update on tests results and plan of care .

## 2015-05-12 ENCOUNTER — Encounter: Payer: BLUE CROSS/BLUE SHIELD | Attending: Nurse Practitioner | Admitting: *Deleted

## 2015-05-12 VITALS — Ht 65.0 in | Wt 140.0 lb

## 2015-05-12 DIAGNOSIS — F509 Eating disorder, unspecified: Secondary | ICD-10-CM

## 2015-05-12 DIAGNOSIS — Z713 Dietary counseling and surveillance: Secondary | ICD-10-CM | POA: Diagnosis not present

## 2015-05-12 NOTE — Progress Notes (Signed)
Appointment start time: 1500  Appointment end time: 1600  Patient was seen on 05/12/15 for nutrition counseling pertaining to disordered eating  Primary care provider: Tomi BambergerSusan Fuller, NP Therapist: Lewie LoronBurwell Anthony Psychiatrist: Dr Evelene CroonKaur.  Sees her regularly  Assessment: Got her tubes untied March 17.  She has been trying to get pregnant since.  She has been eating more and thinks she might be pregnant.   She had her weight checked and she weighs 140 pounds.  She is ok with it because she knows she has to be healthy to have a baby.  Husband made her gain up to 130 She has cut out all caffeine and is drinking more water and Gatorade. She just finished physical therapy, but has been instructed to do exercises at home for 2 weeks.  If things some improve, she will need to go back. 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  1/2 pack/ day cigarettes.  Plans on quitting at the end of the month due to increase in tax.  Plans on doing acupuncture to help quit  Goals for nutrition counseling currently: keep her healthy while she tries to conceive a child and throughout course of potential pregnancy   Growth Metrics: Ideal BMI: 20-25 BMI today: 23.3 % Ideal today:  100%   Mental health diagnosis: AN  Dietary assessment: Eating is going ok  B: Belvita S: cottage cheese and fruit L: sandwich (pimento cheese or PB/J and cheese) S: granola bar D: whatever Luisa Hartatrick cooks S: sometimes granola bar or dried fruit or veggie stick dipped in pimento cheese  Physical activity: foot exercise and elliptical machine sometimes  Fear foods: greasy foods   Compensatory behaviors:  None currently            Estimated energy intake: 1700-2000 kcal  Estimated energy needs: 1900 kcal   Nutrition Diagnosis: NB-1.2 Harmful beliefs/attitudes about food or  nutrition-related topics  As related to proper balance of fats, carbs, and protein.  As evidenced by history of eating disorder.  Intervention/Goals: continue current dietary intake.  Will adjust prn   Meal plan:    3 meals    2-3 snacks   Monitoring and Evaluation: Patient will follow up in 2 weeks.

## 2015-05-26 ENCOUNTER — Encounter: Payer: BLUE CROSS/BLUE SHIELD | Attending: Nurse Practitioner | Admitting: *Deleted

## 2015-05-26 DIAGNOSIS — Z713 Dietary counseling and surveillance: Secondary | ICD-10-CM | POA: Insufficient documentation

## 2015-05-26 DIAGNOSIS — F509 Eating disorder, unspecified: Secondary | ICD-10-CM | POA: Diagnosis present

## 2015-05-26 NOTE — Progress Notes (Signed)
Appointment start time: 1500  Appointment end time: 1600  Patient was seen on 05/26/15 for nutrition counseling pertaining to disordered eating  Primary care provider: Tomi BambergerSusan Fuller, NP Therapist: Lewie LoronBurwell Anthony Psychiatrist: Dr Evelene CroonKaur. Sees her regularly  Assessment: Had miscarriage on Monday.  She was ~4 weeks.  She did not know she was pregnant prior to the miscarriage.  Doctor says to wait a little before trying again, but that she should be able to carry full-term without issue in the future.   She's still trying to eat adequately to ensure she stays healthy.  She is trying not to engage in eating disorder behaviors or other unhealthy behaviors (ie smoking).  She had smoked 5 cigarettes today so far.  No pot since March, since getting her tube untied.  Sees [facility] tomorrow to start REM therapy. She is unsure what to discuss tomorrow: her past or the miscarriage?  I suggested the miscarriage since that is potentially triggering right now and needs to be addressed.   Growth Metrics: Ideal BMI: 20-25 BMI current: 23.3% Ideal today: 100%   Mental health diagnosis: AN  Dietary assessment: A typical day consists of 2-3 meals and several snacks Many of her calories come from beverages   Dietary recall B: 2 chewy granola bars L: 1 1/2 grilled pimento cheese sandwich S: handful chips or almonds or veggie straws D: chicken breast with gravy, lentil and quinoa salad Beverages: frappacino, energy drink, sprite, water  B: granola bar CIB 1/2 energy drink fappacino 1/2 bagel with butter and cream cheese [place] [employer] tonight.  Plans to get steak and loaded baked potato  Physical activity: elliptical machine 10 minutes 3 days per physical therapist recommendation.  Did 20 minutes yesterday because she lost track of time.  Got an ab video, but hasn't started yet Didn't like the dietary exchanges, it was too triggering  Compensatory behaviors: none currently     Estimated energy intake: 1600 kcal  Estimated energy needs: 1900 kcal   Nutrition Diagnosis: NB-1.2 Harmful beliefs/attitudes about food or nutrition-related topics (use with caution) As related to proper balance of fats, carbohydrates, and protein.  As evidenced by eating disorder.  Intervention/Goals: Keep up the eating.  You need to keep your strength to heal from the miscarriage and to stay healthy for your next pregnancy.  Ok to do elliptical 3 day and pilates 2 days/week     Monitoring and Evaluation: Patient will follow up in 2 weeks.

## 2015-05-26 NOTE — Patient Instructions (Signed)
Elliptical 3 days pilates 2 days/week

## 2015-06-09 ENCOUNTER — Ambulatory Visit: Payer: BLUE CROSS/BLUE SHIELD | Admitting: *Deleted

## 2015-07-01 ENCOUNTER — Encounter: Payer: BLUE CROSS/BLUE SHIELD | Attending: Nurse Practitioner | Admitting: *Deleted

## 2015-07-01 ENCOUNTER — Encounter: Payer: Self-pay | Admitting: *Deleted

## 2015-07-01 VITALS — Wt 145.0 lb

## 2015-07-01 DIAGNOSIS — Z713 Dietary counseling and surveillance: Secondary | ICD-10-CM | POA: Insufficient documentation

## 2015-07-01 DIAGNOSIS — F509 Eating disorder, unspecified: Secondary | ICD-10-CM | POA: Diagnosis not present

## 2015-07-01 NOTE — Progress Notes (Signed)
Appointment start time: 0900 Appointment end time: 1000  Patient was seen on 07/01/15 for nutrition counseling pertaining to disordered eating  Primary care provider: Tomi BambergerSusan Fuller, NP Therapist: Lewie LoronBurwell Anthony Psychiatrist: Dr Evelene CroonKaur. Sees her regularly  Assessment: has had really stressful several weeks.  Has left message for there therapist to make an appointment Is unhappy with her weight: Would like to weigh ~130 (15 pound weight loss)   Anthropometrics: Ideal BMI: 20-25 BMI current: 23.3% Ideal today: 100%   Mental health diagnosis: AN  Dietary assessment: A typical day consists of 2-3 meals and several snacks Many of her calories come from beverages  Dietary recall: B: protein shake S: granola bar  L: normally skips because Dana CorporationPatrick cooks large dinners D: ribeye with greek salad; hamburger helper with vegetables Beverages: sprite, water, some milk (but not a lot), sometimes juice, V8 fruit drinks, sometimes FrapacinosCompensatory behaviors: none currently            Physical activity: 10-20 minutes 3 days/week Doesn't feel like she can do many other exercises like pilates   Estimated energy intake: 1600 kcal  Estimated energy needs: 1900 kcal   Nutrition Diagnosis: NB-1.2 Harmful beliefs/attitudes about food or nutrition-related topics (use with caution) As related to proper balance of fats, carbohydrates, and protein.  As evidenced by eating disorder.  Intervention/Goals: Keep up the eating.  Please add lunch in to increase metabolic rate.  Don't have to eat all dinner if not that hungry.  Consider increasing elliptical to 4-5 days/week and consider limiting sugary beverages.      Monitoring and Evaluation: Patient will follow up in 2 weeks.

## 2015-07-14 ENCOUNTER — Ambulatory Visit: Payer: BLUE CROSS/BLUE SHIELD | Admitting: *Deleted

## 2015-07-28 ENCOUNTER — Encounter: Payer: BLUE CROSS/BLUE SHIELD | Attending: Nurse Practitioner | Admitting: *Deleted

## 2015-07-28 DIAGNOSIS — F509 Eating disorder, unspecified: Secondary | ICD-10-CM | POA: Diagnosis not present

## 2015-07-28 DIAGNOSIS — Z713 Dietary counseling and surveillance: Secondary | ICD-10-CM | POA: Diagnosis not present

## 2015-07-28 NOTE — Progress Notes (Signed)
Appointment start time: 1400 Appointment end time: 1500  Patient was seen on 07/28/15 for nutrition counseling pertaining to disordered eating  Primary care provider: Tomi Bamberger, NP Therapist: Lewie Loron Psychiatrist: Dr Evelene Croon. Sees her regularly  Assessment: Got a fitbit to track her activity Starting EMDR with Lewie Loron next week.   Disability case in another 10 months Started nicotine patch for a couple days.  Smoking 3/day.  Last cigarette this morning Has glass of wine every once in awhile, patrick can't drink due to pancreatitis Pot only a couple times/month   Anthropometrics: Ideal BMI: 20-25 BMI current: 23.3% Ideal today: 100%   Mental health diagnosis: AN  Dietary assessment: A typical day consists of 2-3 meals and several snacks Many of her calories come from beverages  Dietary recall: Has been having CIB in the morning  Has been packing lunches for her husband She's eating "ok" Tries to eat sometimes mid-afternoon, unless she sleeps late (yogurt, banana, pudding cup, cereal) Drinking starbucks double shot energy drinks when she goes out. Sometimes raw veggies Ordered pizza last night and ate 5 slices; grilled honey salmon, grilled asparagus, roasted red pepper with quinoa; pizza night before; seasoned baked talapia, corn, mac-n-cheese; seasoned chicken breast baked, green beans, lentils  Sometimes gets seconds Beverages: water, sprite to take medication in the morning, 100% juice  Struggled with constipation and started drinking juice, laxatives didn't work, suppository helped. BM every 2-3 days.  Using laxatives, but thinks she needs Miralax  Fear foods: starches (small portions- measures those), chips, anything greasy  Physical activity: 10-20 minutes 3 days/week.  Hasn't been doing videos because her DVD player isn't hooked up    Estimated energy intake: 1600 kcal  Estimated energy needs: 1900 kcal   Nutrition Diagnosis:  NB-1.2 Harmful beliefs/attitudes about food or nutrition-related topics (use with caution) As related to proper balance of fats, carbohydrates, and protein.  As evidenced by eating disorder.  Intervention/Goals: Keep up the eating.  Consider shifting more food to during the day (actual lunch).  Don't have to eat all dinner if not that hungry.  Consider increasing elliptical to 4-5 days/week and consider limiting sugary beverages.      Monitoring and Evaluation: Patient will follow up in 2 weeks.

## 2015-07-29 ENCOUNTER — Encounter: Payer: Self-pay | Admitting: *Deleted

## 2015-08-11 ENCOUNTER — Ambulatory Visit: Payer: BLUE CROSS/BLUE SHIELD | Admitting: *Deleted

## 2016-05-15 ENCOUNTER — Emergency Department (HOSPITAL_COMMUNITY)
Admission: EM | Admit: 2016-05-15 | Discharge: 2016-05-16 | Disposition: A | Discharge status: Home / Self Care | Payer: Medicaid Other | Attending: Emergency Medicine | Admitting: Emergency Medicine

## 2016-05-15 ENCOUNTER — Emergency Department (HOSPITAL_COMMUNITY): Payer: Medicaid Other

## 2016-05-15 ENCOUNTER — Encounter (HOSPITAL_COMMUNITY): Payer: Self-pay | Admitting: Emergency Medicine

## 2016-05-15 DIAGNOSIS — R1031 Right lower quadrant pain: Secondary | ICD-10-CM | POA: Insufficient documentation

## 2016-05-15 DIAGNOSIS — Z79899 Other long term (current) drug therapy: Secondary | ICD-10-CM | POA: Insufficient documentation

## 2016-05-15 DIAGNOSIS — F17210 Nicotine dependence, cigarettes, uncomplicated: Secondary | ICD-10-CM | POA: Diagnosis not present

## 2016-05-15 DIAGNOSIS — R10813 Right lower quadrant abdominal tenderness: Secondary | ICD-10-CM

## 2016-05-15 DIAGNOSIS — I10 Essential (primary) hypertension: Secondary | ICD-10-CM | POA: Diagnosis not present

## 2016-05-15 DIAGNOSIS — Z91040 Latex allergy status: Secondary | ICD-10-CM | POA: Diagnosis not present

## 2016-05-15 LAB — COMPREHENSIVE METABOLIC PANEL
ALK PHOS: 44 U/L (ref 38–126)
ALT: 76 U/L — AB (ref 14–54)
ANION GAP: 10 (ref 5–15)
AST: 53 U/L — ABNORMAL HIGH (ref 15–41)
Albumin: 4.2 g/dL (ref 3.5–5.0)
BUN: <5 mg/dL — ABNORMAL LOW (ref 6–20)
CALCIUM: 8.9 mg/dL (ref 8.9–10.3)
CHLORIDE: 105 mmol/L (ref 101–111)
CO2: 22 mmol/L (ref 22–32)
CREATININE: 0.51 mg/dL (ref 0.44–1.00)
GFR calc Af Amer: >60 mL/min (ref >60)
Glucose, Bld: 95 mg/dL (ref 65–99)
Potassium: 3.4 mmol/L — ABNORMAL LOW (ref 3.5–5.1)
Sodium: 137 mmol/L (ref 135–145)
Total Bilirubin: 0.5 mg/dL (ref 0.3–1.2)
Total Protein: 7 g/dL (ref 6.5–8.1)

## 2016-05-15 LAB — CBC
HCT: 44.1 % (ref 36.0–46.0)
HEMOGLOBIN: 16 g/dL — AB (ref 12.0–15.0)
MCH: 35.8 pg — ABNORMAL HIGH (ref 26.0–34.0)
MCHC: 36.3 g/dL — ABNORMAL HIGH (ref 30.0–36.0)
MCV: 98.7 fL (ref 78.0–100.0)
Platelets: 249 10*3/uL (ref 150–400)
RBC: 4.47 MIL/uL (ref 3.87–5.11)
RDW: 12.4 % (ref 11.5–15.5)
WBC: 13.7 10*3/uL — AB (ref 4.0–10.5)

## 2016-05-15 LAB — I-STAT BETA HCG BLOOD, ED (MC, WL, AP ONLY): I-stat hCG, quantitative: <5 m[IU]/mL (ref <5)

## 2016-05-15 LAB — LIPASE, BLOOD: LIPASE: 17 U/L (ref 11–51)

## 2016-05-15 MED ORDER — HYDROMORPHONE HCL 1 MG/ML IJ SOLN
0.5000 mg | Freq: Once | INTRAMUSCULAR | Status: AC
  Administered 2016-05-15: 0.5 mg via INTRAVENOUS
  Filled 2016-05-15: qty 1

## 2016-05-15 MED ORDER — MORPHINE SULFATE (PF) 4 MG/ML IV SOLN
4.0000 mg | Freq: Once | INTRAVENOUS | Status: DC
  Filled 2016-05-15: qty 1

## 2016-05-15 MED ORDER — ONDANSETRON HCL 4 MG/2ML IJ SOLN
4.0000 mg | Freq: Once | INTRAMUSCULAR | Status: AC | PRN
  Administered 2016-05-15: 4 mg via INTRAVENOUS
  Filled 2016-05-15: qty 2

## 2016-05-15 MED ORDER — ONDANSETRON HCL 4 MG/2ML IJ SOLN
4.0000 mg | Freq: Once | INTRAMUSCULAR | Status: AC
  Administered 2016-05-15: 4 mg via INTRAVENOUS
  Filled 2016-05-15: qty 2

## 2016-05-15 NOTE — ED Notes (Signed)
Pt brought in by EMS with c/o right lower quadrant pain that started one hour ago  Pt states she has not had a period since April and was seen by her OB/GYN today and they did a urine preg that was negative  Pt states they did blood work that has not been resulted yet  Pt states she has had nausea x 1 month  Pt has been drinking for the past couple of hours

## 2016-05-15 NOTE — ED Provider Notes (Signed)
CSN: [id number]     Arrival date & time 05/15/16  2048 History  By signing my name below, I, [facility], attest that this documentation has been prepared under the direction and in the presence of Shon Baton, MD. Electronically Signed: Randell Patient, ED Scribe. 05/16/2016. 1:24 AM.   Chief Complaint  Patient presents with  . Abdominal Pain   The history is provided by the patient and a significant other. No language interpreter was used.   HPI Comments: Gina Daniels is a 35 y.o. female who presents to the Emergency Department complaining of constant, sudden onset, moderate RLQ abdominal pain onset 30 minutes PTA. She describes the pain as sharp and 10/10 in severity. Pt states that she was seen by her OB this morning for vomiting and diarrhea and to find out if she is pregnant, followed by abdominal pain shortly PTA. She reports emesis and diarrhea ongoing for the past 3 weeks. She has not taken any medications or attempted any treatments. Significant other reports that the pt has an hx of gallbladder problems with similar abdominal pain that resolved without treatment. Per pt, her menstrual cycle is 3 weeks late and she notes that she had a tubal ligation reversed last year and is at risk for ectopic pregnancies. Denies any other symptoms currently.   Past Medical History  Diagnosis Date  . Gallstones   . 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 Depression   . Anxiety   . Anorexia   . PTSD (post-traumatic stress disorder)   . Headache   . Hypertension   . Anorexia nervosa    Past Surgical History  Procedure Laterality Date  . Tubal ligation  2002  . Wisdom tooth extraction  2002  . Foot surgery      RIGHT   . Chalazion excision      2012 LEFT EYE  . Incision and drainage Right 10/05/2014    Procedure: INCISION AND DRAINAGE RIGHT FOOT WOUND WITH APPLICATION OF WOUND VAC;  Surgeon: Toni Arthurs, MD;  Location: [facility] OR;  Service: Orthopedics;   Laterality: Right;   Family History  Problem Relation Age of Onset  . Hypertension Mother   . Hypertension Father   . Heart attack Father   . Diabetes Maternal Grandmother    Social History  Substance Use Topics  . Smoking status: Current Every Day Smoker -- 0.50 packs/day for 21 years    Types: Cigarettes  . Smokeless tobacco: None  . Alcohol Use: Yes     Comment: OCC   OB History    No data available     Review of Systems  Constitutional: Negative for fever.  Respiratory: Negative for shortness of breath.   Gastrointestinal: Positive for nausea, abdominal pain and diarrhea. Negative for vomiting.  Genitourinary: Negative for dysuria and vaginal bleeding.  All other systems reviewed and are negative.     Allergies  Latex; Penicillins; Codeine; Sulfa antibiotics; Morphine and related; and Tape  Home Medications   Prior to Admission medications   Medication Sig Start Date End Date Taking? Authorizing Provider  BIOTIN PO Take 1 tablet by mouth daily.   Yes Historical Provider, MD  desvenlafaxine (PRISTIQ) 50 MG 24 hr tablet Take 50 mg by mouth daily. 04/30/16  Yes Historical Provider, MD  lamoTRIgine (LAMICTAL) 100 MG tablet Take 100 mg by mouth daily. 05/15/16  Yes Historical Provider, MD  Naproxen Sodium (ALEVE) 220 MG CAPS Take 440 mg by mouth 2 (  two) times daily as needed (pain).    Yes Historical Provider, MD  Vitamin D, Cholecalciferol, 1000 units CAPS Take 1,000 Units by mouth daily.   Yes Historical Provider, MD  ondansetron (ZOFRAN ODT) 4 MG disintegrating tablet Take 1 tablet (4 mg total) by mouth every 8 (eight) hours as needed for nausea or vomiting. 05/16/16   Shon Baton, MD   BP 132/85 mmHg  Pulse 80  Temp(Src) 97.8 F (36.6 C) (Oral)  Resp 20  SpO2 95%  LMP 03/30/2016 (Exact Date) Physical Exam  Constitutional: She is oriented to person, place, and time.  Crying and wailing when I entered the room  HENT:  Head: Normocephalic and atraumatic.   Poor dentition  Cardiovascular: Normal rate, regular rhythm and normal heart sounds.   No murmur heard. Pulmonary/Chest: Effort normal and breath sounds normal. No respiratory distress. She has no wheezes.  Abdominal: Soft. Bowel sounds are normal. There is tenderness. There is no rebound and no guarding.  Right lower quadrant tenderness to palpation, distractible  Neurological: She is alert and oriented to person, place, and time.  Skin: Skin is warm and dry.  Psychiatric: She has a normal mood and affect.  Nursing note and vitals reviewed.   ED Course  Procedures (including critical care time)  DIAGNOSTIC STUDIES: Oxygen Saturation is 95% on RA, adequate by my interpretation.    COORDINATION OF CARE: 11:08 PM Discussed results pregnancy test. Will order labs, morphine, Zofran, and abdomen US. Discussed treatment plan with pt at bedside and pt agreed to plan.   Labs Review Labs Reviewed  COMPREHENSIVE METABOLIC PANEL - Abnormal; Notable for the following:    Potassium 3.4 (*)    BUN <5 (*)    AST 53 (*)    ALT 76 (*)    All other components within normal limits  CBC - Abnormal; Notable for the following:    WBC 13.7 (*)    Hemoglobin 16.0 (*)    MCH 35.8 (*)    MCHC 36.3 (*)    All other components within normal limits  URINALYSIS, ROUTINE W REFLEX MICROSCOPIC (NOT AT [facility]) - Abnormal; Notable for the following:    Hgb urine dipstick LARGE (*)    All other components within normal limits  URINE MICROSCOPIC-ADD ON - Abnormal; Notable for the following:    Squamous Epithelial / LPF 0-5 (*)    All other components within normal limits  LIPASE, BLOOD  I-STAT BETA HCG BLOOD, ED ([facility], [facility], [facility] ONLY)    Imaging Review US Pelvis Complete  05/16/2016  CLINICAL DATA:  Initial evaluation for acute right lower quadrant tenderness. EXAM: TRANSABDOMINAL ULTRASOUND OF PELVIS DOPPLER ULTRASOUND OF OVARIES TECHNIQUE: Transabdominal ultrasound examination of the pelvis was performed  including evaluation of the uterus, ovaries, adnexal regions, and pelvic cul-de-sac. Color and duplex Doppler ultrasound was utilized to evaluate blood flow to the ovaries. COMPARISON:  None. FINDINGS: Uterus Measurements: 5.9 x 3.1 x 4.3 cm. No fibroids or other mass visualized. Endometrium Thickness: 5.6 mm. No focal abnormality visualized. Right ovary Measurements: 6.9 x 3.2 x 4.4 cm. Normal appearance of the right ovary. Up to cystic lesions were present, 1 measuring 2.6 x 1.8 x 2.7 cm, the second measuring 2.5 x 1.4 x 1.9 cm. No internal vascularity. Left ovary Measurements: 3.1 x 1.7 x 1.9 cm. Normal appearance/no adnexal mass. Pulsed Doppler evaluation demonstrates normal low-resistance arterial and venous waveforms in both ovaries. IMPRESSION: 1. No sonographic evidence for ovarian torsion. 2. Two cystic lesions  arising from the right ovary as above, likely simple physiologic cysts. Electronically Signed   By: Rise MuBenjamin  McClintock M.D.   On: 05/16/2016 00:13   [place] Art/ven Flow Abd Pelv Doppler  05/16/2016  CLINICAL DATA:  Initial evaluation for acute right lower quadrant tenderness. EXAM: TRANSABDOMINAL ULTRASOUND OF PELVIS DOPPLER ULTRASOUND OF OVARIES TECHNIQUE: Transabdominal ultrasound examination of the pelvis was performed including evaluation of the uterus, ovaries, adnexal regions, and pelvic cul-de-sac. Color and duplex Doppler ultrasound was utilized to evaluate blood flow to the ovaries. COMPARISON:  None. FINDINGS: Uterus Measurements: 5.9 x 3.1 x 4.3 cm. No fibroids or other mass visualized. Endometrium Thickness: 5.6 mm. No focal abnormality visualized. Right ovary Measurements: 6.9 x 3.2 x 4.4 cm. Normal appearance of the right ovary. Up to cystic lesions were present, 1 measuring 2.6 x 1.8 x 2.7 cm, the second measuring 2.5 x 1.4 x 1.9 cm. No internal vascularity. Left ovary Measurements: 3.1 x 1.7 x 1.9 cm. Normal appearance/no adnexal mass. Pulsed Doppler evaluation demonstrates normal  low-resistance arterial and venous waveforms in both ovaries. IMPRESSION: 1. No sonographic evidence for ovarian torsion. 2. Two cystic lesions arising from the right ovary as above, likely simple physiologic cysts. Electronically Signed   By: Rise MuBenjamin  McClintock M.D.   On: 05/16/2016 00:13   I have personally reviewed and evaluated these images and lab results as part of my medical decision-making.   EKG Interpretation None      MDM   Final diagnoses:  RLQ abdominal tenderness    Patient presents with right lower quadrant abdominal pain. Also one month history of nausea and menstrual cycle. Reports negative urine pregnancy at her OB office earlier today. She also had a pelvic exam. She has some right lower quadrant tenderness but is distractible. Will obtain ultrasound to rule out ovarian torsion. Doubt appendicitis given the acuity of presentation. She does have a mild leukocytosis but appears hemoconcentrated with hemoglobin of 16 as well. She is afebrile. On repeat exam, she is more comfortable appearing with improved pain. Nontender on reexam. Patient was given strict return precautions.  After history, exam, and medical workup I feel the patient has been appropriately medically screened and is safe for discharge home. Pertinent diagnoses were discussed with the patient. Patient was given return precautions.  I personally performed the services described in this documentation, which was scribed in my presence. The recorded information has been reviewed and is accurate.    Shon Batonourtney F Raelin Pixler, MD 05/16/16 [phone]

## 2016-05-15 NOTE — ED Notes (Signed)
Ultrasound at bedside
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
## 2016-05-15 NOTE — ED Notes (Signed)
Pt stated she is unable to give a urine sample. Pt is aware one is needed.

## 2016-05-15 NOTE — ED Notes (Signed)
Verified with MD that second dose of Zofran was appropriate at this time

## 2016-05-16 LAB — URINE MICROSCOPIC-ADD ON
Bacteria, UA: NONE SEEN
WBC, UA: NONE SEEN WBC/hpf (ref 0–5)

## 2016-05-16 LAB — URINALYSIS, ROUTINE W REFLEX MICROSCOPIC
BILIRUBIN URINE: NEGATIVE
GLUCOSE, UA: NEGATIVE mg/dL
KETONES UR: NEGATIVE mg/dL
Leukocytes, UA: NEGATIVE
Nitrite: NEGATIVE
PH: 5.5 (ref 5.0–8.0)
Protein, ur: NEGATIVE mg/dL
SPECIFIC GRAVITY, URINE: 1.005 (ref 1.005–1.030)

## 2016-05-16 MED ORDER — ONDANSETRON 4 MG PO TBDP: 4.0000 mg | ORAL_TABLET | Freq: Three times a day (TID) | ORAL | Status: DC | PRN

## 2016-05-16 NOTE — ED Notes (Signed)
Pt assisted to restroom and told to pull call bell when finished

## 2016-05-16 NOTE — ED Notes (Signed)
Pt tolerating fluids.   

## 2016-05-16 NOTE — Discharge Instructions (Signed)
You were seen today for abdominal pain. You are not pregnant. Your ovaries and uterus are normal on ultrasound. Given how quickly her pain came on, doubt appendicitis. Follow-up with her OB. If he has worsening pain or any new or worsening symptoms she should be reevaluated.  Abdominal Pain, Adult Many things can cause abdominal pain. Usually, abdominal pain is not caused by a disease and will improve without treatment. It can often be observed and treated at home. Your health care provider will do a physical exam and possibly order blood tests and X-rays to help determine the seriousness of your pain. However, in many cases, more time must pass before a clear cause of the pain can be found. Before that point, your health care provider may not know if you need more testing or further treatment. HOME CARE INSTRUCTIONS Monitor your abdominal pain for any changes. The following actions may help to alleviate any discomfort you are experiencing:  Only take over-the-counter or prescription medicines as directed by your health care provider.  Do not take laxatives unless directed to do so by your health care provider.  Try a clear liquid diet (broth, tea, or water) as directed by your health care provider. Slowly move to a bland diet as tolerated. SEEK MEDICAL CARE IF:  You have unexplained abdominal pain.  You have abdominal pain associated with nausea or diarrhea.  You have pain when you urinate or have a bowel movement.  You experience abdominal pain that wakes you in the night.  You have abdominal pain that is worsened or improved by eating food.  You have abdominal pain that is worsened with eating fatty foods.  You have a fever. SEEK IMMEDIATE MEDICAL CARE IF:  Your pain does not go away within 2 hours.  You keep throwing up (vomiting).  Your pain is felt only in portions of the abdomen, such as the right side or the left lower portion of the abdomen.  You pass bloody or black tarry  stools. MAKE SURE YOU:  Understand these instructions.  Will watch your condition.  Will get help right away if you are not doing well or get worse.   This information is not intended to replace advice given to you by your health care provider. Make sure you discuss any questions you have with your health care provider.   Document Released: 09/19/2005 Document Revised: 08/31/2015 Document Reviewed: 08/19/2013 Elsevier Interactive Patient Education [employer]2016 Elsevier Inc.

## 2016-08-21 ENCOUNTER — Other Ambulatory Visit: Payer: Self-pay | Admitting: Orthopaedic Surgery

## 2016-08-21 ENCOUNTER — Encounter (HOSPITAL_COMMUNITY): Payer: Self-pay | Admitting: *Deleted

## 2016-08-21 NOTE — Progress Notes (Signed)
Anesthesia Chart Review:  Pt is a same day work up.   Pt is a [age] year old female scheduled for ORIF of L ulnar fracture on 08/22/2016 with Gershon MusselNaiping Xu, MD.   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 I can find no documentation in our system of this encounter. Pericarditis is not listed as a medical problem in 07/2009 notes but is listed as a problem in 02/2010 notes. Pt also denies having HTN, and is not currently taking meds for HTN. BP at ER visit 05/15/16 was 132/85.   Labs will be obtained DOS.   EKG will be obtained DOS.   If test results acceptable, I anticipate pt can proceed as scheduled.   Rica Mastngela Kharma Sampsel, FNP-BC [facility]Anesthesiology Phone: [phone] 08/21/2016 1:17 PM

## 2016-08-21 NOTE — Progress Notes (Signed)
Pt denies SOB, chest pain, and being under the care of a cardiologist. Pt denies having a stress test, echo and cardiac cath. Pt denies currently being treated for hypertension. Pt stated that she had pericarditis " in 2011-2012" and was [facility] and denies having any cardiac problems since. Pt denies having a chest x ray within the last year and any labs within the last 2 weeks. Pt made aware to stop  taking Aspirin, vitamins, fish oil and herbal medications such as Biotin . Do not take any NSAIDs ie: Ibuprofen, Advil, Naproxen, BC and Goody Powder or any medication containing Aspirin. Pt verbalized understanding of all pre-op instructions. Anesthesia asked to review pt history.

## 2016-08-22 ENCOUNTER — Ambulatory Visit (HOSPITAL_COMMUNITY): Payer: 59 | Admitting: Emergency Medicine

## 2016-08-22 ENCOUNTER — Encounter (HOSPITAL_COMMUNITY): Payer: Self-pay | Admitting: *Deleted

## 2016-08-22 ENCOUNTER — Ambulatory Visit (HOSPITAL_COMMUNITY): Payer: 59

## 2016-08-22 ENCOUNTER — Encounter (HOSPITAL_COMMUNITY)
Admission: RE | Disposition: A | Discharge status: Home / Self Care | Payer: Self-pay | Source: Ambulatory Visit | Attending: Orthopaedic Surgery

## 2016-08-22 ENCOUNTER — Ambulatory Visit (HOSPITAL_COMMUNITY)
Admission: RE | Admit: 2016-08-22 | Discharge: 2016-08-22 | Disposition: A | Discharge status: Home / Self Care | Payer: 59 | Source: Ambulatory Visit | Attending: Orthopaedic Surgery | Admitting: Orthopaedic Surgery

## 2016-08-22 DIAGNOSIS — F329 Major depressive disorder, single episode, unspecified: Secondary | ICD-10-CM | POA: Diagnosis not present

## 2016-08-22 DIAGNOSIS — I10 Essential (primary) hypertension: Secondary | ICD-10-CM | POA: Insufficient documentation

## 2016-08-22 DIAGNOSIS — F17210 Nicotine dependence, cigarettes, uncomplicated: Secondary | ICD-10-CM | POA: Insufficient documentation

## 2016-08-22 DIAGNOSIS — X58XXXA Exposure to other specified factors, initial encounter: Secondary | ICD-10-CM | POA: Insufficient documentation

## 2016-08-22 DIAGNOSIS — S52202A Unspecified fracture of shaft of left ulna, initial encounter for closed fracture: Secondary | ICD-10-CM | POA: Insufficient documentation

## 2016-08-22 DIAGNOSIS — F419 Anxiety disorder, unspecified: Secondary | ICD-10-CM | POA: Diagnosis not present

## 2016-08-22 DIAGNOSIS — Z419 Encounter for procedure for purposes other than remedying health state, unspecified: Secondary | ICD-10-CM

## 2016-08-22 HISTORY — PX: ORIF ULNAR FRACTURE: SHX5417

## 2016-08-22 LAB — CBC
HEMATOCRIT: 47.6 % — AB (ref 36.0–46.0)
HEMOGLOBIN: 15.4 g/dL — AB (ref 12.0–15.0)
MCH: 33.8 pg (ref 26.0–34.0)
MCHC: 32.4 g/dL (ref 30.0–36.0)
MCV: 104.6 fL — AB (ref 78.0–100.0)
Platelets: 231 10*3/uL (ref 150–400)
RBC: 4.55 MIL/uL (ref 3.87–5.11)
RDW: 13.2 % (ref 11.5–15.5)
WBC: 12 10*3/uL — AB (ref 4.0–10.5)

## 2016-08-22 LAB — COMPREHENSIVE METABOLIC PANEL
ALK PHOS: 54 U/L (ref 38–126)
ALT: 51 U/L (ref 14–54)
AST: 53 U/L — AB (ref 15–41)
Albumin: 3.4 g/dL — ABNORMAL LOW (ref 3.5–5.0)
Anion gap: 12 (ref 5–15)
BILIRUBIN TOTAL: 0.6 mg/dL (ref 0.3–1.2)
CO2: 21 mmol/L — ABNORMAL LOW (ref 22–32)
CREATININE: 0.61 mg/dL (ref 0.44–1.00)
Calcium: 8.9 mg/dL (ref 8.9–10.3)
Chloride: 106 mmol/L (ref 101–111)
GFR calc Af Amer: >60 mL/min (ref >60)
Glucose, Bld: 107 mg/dL — ABNORMAL HIGH (ref 65–99)
Potassium: 4 mmol/L (ref 3.5–5.1)
Sodium: 139 mmol/L (ref 135–145)
TOTAL PROTEIN: 5.9 g/dL — AB (ref 6.5–8.1)

## 2016-08-22 LAB — HCG, SERUM, QUALITATIVE: PREG SERUM: NEGATIVE

## 2016-08-22 SURGERY — OPEN REDUCTION INTERNAL FIXATION (ORIF) ULNAR FRACTURE
Anesthesia: Regional | Laterality: Left

## 2016-08-22 MED ORDER — METHOCARBAMOL 500 MG PO TABS
ORAL_TABLET | ORAL | Status: AC
  Administered 2016-08-22: 750 mg via ORAL
  Filled 2016-08-22: qty 2

## 2016-08-22 MED ORDER — PROMETHAZINE HCL 25 MG/ML IJ SOLN: 6.2500 mg | INTRAMUSCULAR | Status: DC | PRN

## 2016-08-22 MED ORDER — BUPIVACAINE-EPINEPHRINE (PF) 0.5% -1:200000 IJ SOLN
INTRAMUSCULAR | Status: DC | PRN
  Administered 2016-08-22: 20 mL via PERINEURAL

## 2016-08-22 MED ORDER — FENTANYL CITRATE (PF) 100 MCG/2ML IJ SOLN
INTRAMUSCULAR | Status: DC | PRN
  Administered 2016-08-22 (×2): 50 ug via INTRAVENOUS

## 2016-08-22 MED ORDER — MIDAZOLAM HCL 2 MG/2ML IJ SOLN
INTRAMUSCULAR | Status: AC
  Filled 2016-08-22: qty 2

## 2016-08-22 MED ORDER — ONDANSETRON HCL 4 MG/2ML IJ SOLN
INTRAMUSCULAR | Status: AC
  Filled 2016-08-22: qty 2

## 2016-08-22 MED ORDER — PROPOFOL 10 MG/ML IV BOLUS
INTRAVENOUS | Status: DC | PRN
  Administered 2016-08-22: 200 mg via INTRAVENOUS

## 2016-08-22 MED ORDER — PROPOFOL 10 MG/ML IV BOLUS
INTRAVENOUS | Status: AC
  Filled 2016-08-22: qty 20

## 2016-08-22 MED ORDER — OXYCODONE-ACETAMINOPHEN 5-325 MG PO TABS: 1.0000 | ORAL_TABLET | ORAL | 0 refills | Status: DC | PRN

## 2016-08-22 MED ORDER — FENTANYL CITRATE (PF) 100 MCG/2ML IJ SOLN
INTRAMUSCULAR | Status: AC
  Filled 2016-08-22: qty 4

## 2016-08-22 MED ORDER — PHENYLEPHRINE 40 MCG/ML (10ML) SYRINGE FOR IV PUSH (FOR BLOOD PRESSURE SUPPORT)
PREFILLED_SYRINGE | INTRAVENOUS | Status: AC
  Filled 2016-08-22: qty 10

## 2016-08-22 MED ORDER — CLINDAMYCIN PHOSPHATE 900 MG/50ML IV SOLN: 900.0000 mg | INTRAVENOUS | Status: DC

## 2016-08-22 MED ORDER — ONDANSETRON HCL 4 MG PO TABS: 4.0000 mg | ORAL_TABLET | Freq: Three times a day (TID) | ORAL | 0 refills | Status: DC | PRN

## 2016-08-22 MED ORDER — ROCURONIUM BROMIDE 10 MG/ML (PF) SYRINGE
PREFILLED_SYRINGE | INTRAVENOUS | Status: AC
  Filled 2016-08-22: qty 10

## 2016-08-22 MED ORDER — LACTATED RINGERS IV SOLN
INTRAVENOUS | Status: DC
  Administered 2016-08-22: 10:00:00 via INTRAVENOUS

## 2016-08-22 MED ORDER — METHOCARBAMOL 750 MG PO TABS: 750.0000 mg | ORAL_TABLET | Freq: Two times a day (BID) | ORAL | 0 refills | Status: DC | PRN

## 2016-08-22 MED ORDER — OXYCODONE-ACETAMINOPHEN 5-325 MG PO TABS
ORAL_TABLET | ORAL | Status: AC
  Administered 2016-08-22: 2 via ORAL
  Filled 2016-08-22: qty 2

## 2016-08-22 MED ORDER — LIDOCAINE 2% (20 MG/ML) 5 ML SYRINGE
INTRAMUSCULAR | Status: AC
  Filled 2016-08-22: qty 5

## 2016-08-22 MED ORDER — CLINDAMYCIN PHOSPHATE 900 MG/50ML IV SOLN
INTRAVENOUS | Status: AC
  Filled 2016-08-22: qty 50

## 2016-08-22 MED ORDER — LIDOCAINE HCL (CARDIAC) 20 MG/ML IV SOLN
INTRAVENOUS | Status: DC | PRN
Start: 2016-08-22 — End: 2016-08-22
  Administered 2016-08-22: 100 mg via INTRAVENOUS

## 2016-08-22 MED ORDER — OXYCODONE-ACETAMINOPHEN 5-325 MG PO TABS
1.0000 | ORAL_TABLET | Freq: Once | ORAL | Status: AC
  Administered 2016-08-22: 2 via ORAL

## 2016-08-22 MED ORDER — SENNOSIDES-DOCUSATE SODIUM 8.6-50 MG PO TABS: 1.0000 | ORAL_TABLET | Freq: Every evening | ORAL | 1 refills | Status: DC | PRN

## 2016-08-22 MED ORDER — 0.9 % SODIUM CHLORIDE (POUR BTL) OPTIME
TOPICAL | Status: DC | PRN
  Administered 2016-08-22: 1000 mL

## 2016-08-22 MED ORDER — CLINDAMYCIN PHOSPHATE 900 MG/50ML IV SOLN
INTRAVENOUS | Status: DC | PRN
  Administered 2016-08-22: 900 mg via INTRAVENOUS

## 2016-08-22 MED ORDER — FENTANYL CITRATE (PF) 100 MCG/2ML IJ SOLN: 25.0000 ug | INTRAMUSCULAR | Status: DC | PRN

## 2016-08-22 MED ORDER — MIDAZOLAM HCL 2 MG/2ML IJ SOLN
INTRAMUSCULAR | Status: AC
  Administered 2016-08-22: 2 mg
  Filled 2016-08-22: qty 2

## 2016-08-22 MED ORDER — FENTANYL CITRATE (PF) 100 MCG/2ML IJ SOLN
INTRAMUSCULAR | Status: AC
  Administered 2016-08-22: 100 ug
  Filled 2016-08-22: qty 2

## 2016-08-22 MED ORDER — METHOCARBAMOL 500 MG PO TABS
750.0000 mg | ORAL_TABLET | Freq: Once | ORAL | Status: AC
  Administered 2016-08-22: 750 mg via ORAL

## 2016-08-22 SURGICAL SUPPLY — 58 items
BANDAGE ACE 3X5.8 VEL STRL LF (GAUZE/BANDAGES/DRESSINGS) ×3 IMPLANT
BANDAGE ACE 4X5 VEL STRL LF (GAUZE/BANDAGES/DRESSINGS) ×3 IMPLANT
BANDAGE ELASTIC 3 VELCRO ST LF (GAUZE/BANDAGES/DRESSINGS) ×3 IMPLANT
BIT DRILL 2.0 (BIT) ×1
BIT DRILL 2.0MM (BIT) ×1
BIT DRILL 2.5X2.75 QC CALB (BIT) ×3 IMPLANT
BIT DRILL 2XNS DISP SS SM FRAG (BIT) ×1 IMPLANT
BIT DRL 2XNS DISP SS SM FRAG (BIT) ×1
BLADE SURG 10 STRL SS (BLADE) ×3 IMPLANT
BLADE SURG 15 STRL LF DISP TIS (BLADE) ×1 IMPLANT
BLADE SURG 15 STRL SS (BLADE) ×2
BLADE SURG ROTATE 9660 (MISCELLANEOUS) IMPLANT
BNDG ESMARK 4X9 LF (GAUZE/BANDAGES/DRESSINGS) ×3 IMPLANT
BNDG GAUZE ELAST 4 BULKY (GAUZE/BANDAGES/DRESSINGS) ×3 IMPLANT
CORDS BIPOLAR (ELECTRODE) ×3 IMPLANT
COVER SURGICAL LIGHT HANDLE (MISCELLANEOUS) ×3 IMPLANT
CUFF TOURNIQUET SINGLE 18IN (TOURNIQUET CUFF) ×3 IMPLANT
CUFF TOURNIQUET SINGLE 24IN (TOURNIQUET CUFF) IMPLANT
DRAPE C-ARM 42X72 X-RAY (DRAPES) IMPLANT
DRAPE U-SHAPE 47X51 STRL (DRAPES) ×3 IMPLANT
ELECT CAUTERY BLADE 6.4 (BLADE) ×3 IMPLANT
GAUZE SPONGE 4X4 12PLY STRL (GAUZE/BANDAGES/DRESSINGS) ×3 IMPLANT
GAUZE XEROFORM 1X8 LF (GAUZE/BANDAGES/DRESSINGS) ×3 IMPLANT
GAUZE XEROFORM 5X9 LF (GAUZE/BANDAGES/DRESSINGS) ×3 IMPLANT
GLOVE SKINSENSE NS SZ7.5 (GLOVE) ×2
GLOVE SKINSENSE STRL SZ7.5 (GLOVE) ×1 IMPLANT
GOWN STRL REIN XL XLG (GOWN DISPOSABLE) ×3 IMPLANT
KIT BASIN OR (CUSTOM PROCEDURE TRAY) ×3 IMPLANT
KIT ROOM TURNOVER OR (KITS) ×3 IMPLANT
NEEDLE 22X1 1/2 (OR ONLY) (NEEDLE) IMPLANT
NS IRRIG 1000ML POUR BTL (IV SOLUTION) ×3 IMPLANT
PACK ORTHO EXTREMITY (CUSTOM PROCEDURE TRAY) ×3 IMPLANT
PAD ARMBOARD 7.5X6 YLW CONV (MISCELLANEOUS) ×6 IMPLANT
PAD CAST 4YDX4 CTTN HI CHSV (CAST SUPPLIES) ×1 IMPLANT
PADDING CAST COTTON 4X4 STRL (CAST SUPPLIES) ×2
PLATE LOCK 12H 164 BILAT FIB (Plate) ×3 IMPLANT
SCREW CORT 12X2.7XNONLOCK HEX (Screw) ×1 IMPLANT
SCREW CORTICAL 2.7MM  14MM (Screw) ×10 IMPLANT
SCREW CORTICAL 2.7MM  18MM (Screw) ×2 IMPLANT
SCREW CORTICAL 2.7MM 14MM (Screw) ×5 IMPLANT
SCREW CORTICAL 2.7MM 16MM (Screw) ×6 IMPLANT
SCREW CORTICAL 2.7MM 18MM (Screw) ×1 IMPLANT
SCREW CORTICAL 2.7X12 (Screw) ×2 IMPLANT
SCREW LOW PROFILE 18MMX3.5MM (Screw) ×3 IMPLANT
SCREW NON LOCKING LP 3.5 16MM (Screw) ×6 IMPLANT
SPLINT FIBERGLASS 3X35 (CAST SUPPLIES) ×3 IMPLANT
SPONGE GAUZE 4X4 12PLY STER LF (GAUZE/BANDAGES/DRESSINGS) ×3 IMPLANT
SPONGE LAP 4X18 X RAY DECT (DISPOSABLE) IMPLANT
SUT ETHILON 4 0 PS 2 18 (SUTURE) ×3 IMPLANT
SUT MNCRL AB 4-0 PS2 18 (SUTURE) IMPLANT
SUT PROLENE 3 0 PS 2 (SUTURE) IMPLANT
SUT VIC AB 3-0 FS2 27 (SUTURE) ×3 IMPLANT
SYR CONTROL 10ML LL (SYRINGE) IMPLANT
TOWEL OR 17X24 6PK STRL BLUE (TOWEL DISPOSABLE) ×3 IMPLANT
TOWEL OR 17X26 10 PK STRL BLUE (TOWEL DISPOSABLE) ×3 IMPLANT
TUBE CONNECTING 12'X1/4 (SUCTIONS) ×1
TUBE CONNECTING 12X1/4 (SUCTIONS) ×2 IMPLANT
UNDERPAD 30X30 (UNDERPADS AND DIAPERS) ×3 IMPLANT

## 2016-08-22 NOTE — H&P (Signed)
PREOPERATIVE H&P  Chief Complaint: left ulna fracture  HPI: Gina Daniels is a 35 y.o. female who presents for surgical treatment of left ulna fracture.  She denies any changes in medical history.  Past Medical History:  Diagnosis Date  . Anorexia   . Anorexia nervosa   . Anxiety   . Depression   . 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 Gallstones   . Headache   . Hypertension   . PTSD (post-traumatic stress disorder)    Past Surgical History:  Procedure Laterality Date  . CHALAZION EXCISION     2012 LEFT EYE  . FOOT SURGERY     RIGHT   . INCISION AND DRAINAGE Right 10/05/2014   Procedure: INCISION AND DRAINAGE RIGHT FOOT WOUND WITH APPLICATION OF WOUND VAC;  Surgeon: Toni Arthurs, MD;  Location: [facility] OR;  Service: Orthopedics;  Laterality: Right;  . TUBAL LIGATION  2002  . tubal reversal     02/2014  . WISDOM TOOTH EXTRACTION  2002   Social History   Social History  . Marital status: Married    Spouse name: N/A  . Number of children: N/A  . Years of education: N/A   Social History Main Topics  . Smoking status: Current Every Day Smoker    Packs/day: 1.00    Years: 21.00    Types: Cigarettes  . Smokeless tobacco: Never Used     Comment: was using patch but can no longer afford it  . Alcohol use Yes     Comment: OCC  . Drug use: No  . Sexual activity: Yes    Birth control/ protection: Other-see comments   Other Topics Concern  . None   Social History Narrative  . None   Family History  Problem Relation Age of Onset  . Hypertension Mother   . Hypertension Father   . Heart attack Father   . Diabetes Maternal Grandmother    Allergies  Allergen Reactions  . Latex Anaphylaxis  . Penicillins Anaphylaxis    Has patient had a PCN reaction causing immediate rash, facial/tongue/throat swelling, SOB or lightheadedness with hypotension:  yes Has patient had a PCN reaction causing severe rash involving mucus membranes or skin necrosis:  no Has patient had a PCN reaction that required hospitalization: yes Has patient had a PCN reaction occurring within the last 10 years: yes If all of the above answers are "NO", then may proceed with Cephalosporin use.   . Codeine Hives and Rash  . Sulfa Antibiotics Hives  . Morphine And Related Rash  . Tape Rash   Prior to Admission medications   Medication Sig Start Date End Date Taking? Authorizing Provider  chlordiazePOXIDE (LIBRIUM) 25 MG capsule Take 25 mg by mouth daily after breakfast.   Yes Historical Provider, MD  desvenlafaxine (PRISTIQ) 50 MG 24 hr tablet Take 50 mg by mouth daily. 04/30/16  Yes Historical Provider, MD  diphenhydrAMINE (BENADRYL) 25 MG tablet Take 50 mg by mouth daily with breakfast.   Yes Historical Provider, MD  lamoTRIgine (LAMICTAL) 100 MG tablet Take 100 mg by mouth every morning.  05/15/16  Yes Historical Provider, MD  Naproxen Sodium (ALEVE) 220 MG CAPS Take 440 mg by mouth 2 (two) times daily as needed (pain).    Yes Historical Provider, MD  OLANZapine-FLUoxetine (SYMBYAX) 3-25 MG capsule Take 1 capsule by mouth every evening.   Yes Historical Provider, MD     Positive ROS: All other systems have  been reviewed and were otherwise negative with the exception of those mentioned in the HPI and as above.  Physical Exam: General: Alert, no acute distress Cardiovascular: No pedal edema Respiratory: No cyanosis, no use of accessory musculature GI: abdomen soft Skin: No lesions in the area of chief complaint Neurologic: Sensation intact distally Psychiatric: Patient is competent for consent with normal mood and affect Lymphatic: no lymphedema  MUSCULOSKELETAL: exam stable  Assessment: left ulna fracture  Plan: Plan for Procedure(s): OPEN REDUCTION INTERNAL FIXATION (ORIF)  LEFT ULNA FRACTURE  The risks benefits and alternatives were discussed with the patient including but not limited to the risks of nonoperative treatment, versus surgical  intervention including infection, bleeding, nerve injury,  blood clots, cardiopulmonary complications, morbidity, mortality, among others, and they were willing to proceed.   Cheral AlmasXu, Shayon Trompeter Michael, MD   08/22/2016 9:35 AM

## 2016-08-22 NOTE — Anesthesia Procedure Notes (Signed)
Procedure Name: LMA Insertion Date/Time: 08/22/2016 12:08 PM Performed by: Fransisca KaufmannMEYER, Orley Lawry E Pre-anesthesia Checklist: Patient identified, Emergency Drugs available, Suction available and Patient being monitored Patient Re-evaluated:Patient Re-evaluated prior to inductionOxygen Delivery Method: Circle system utilized Preoxygenation: Pre-oxygenation with 100% oxygen Intubation Type: IV induction Ventilation: Mask ventilation without difficulty LMA: LMA inserted LMA Size: 4.0 Number of attempts: 1 Placement Confirmation: positive ETCO2 and breath sounds checked- equal and bilateral Tube secured with: Tape Dental Injury: Teeth and Oropharynx as per pre-operative assessment

## 2016-08-22 NOTE — Transfer of Care (Signed)
Immediate Anesthesia Transfer of Care Note  Patient: Gina Daniels  Procedure(s) Performed: Procedure(s): OPEN REDUCTION INTERNAL FIXATION (ORIF)  LEFT ULNA FRACTURE (Left)  Patient Location: PACU  Anesthesia Type:General and Regional  Level of Consciousness: awake, alert , oriented and sedated  Airway & Oxygen Therapy: Patient Spontanous Breathing and Patient connected to nasal cannula oxygen  Post-op Assessment: Report given to RN, Post -op Vital signs reviewed and stable and Patient moving all extremities  Post vital signs: Reviewed and stable  Last Vitals:  Vitals:   08/22/16 1009  BP: (!) 174/96  Pulse: (!) 114  Resp: 18  Temp: 37.1 C    Last Pain:  Vitals:   08/22/16 1009  TempSrc: Oral         Complications: No apparent anesthesia complications

## 2016-08-22 NOTE — Anesthesia Preprocedure Evaluation (Addendum)
Anesthesia Evaluation  Patient identified by MRN, date of birth, ID band Patient awake    Reviewed: Allergy & Precautions, H&P , NPO status , Patient's Chart, lab work & pertinent test results  Airway Mallampati: II  TM Distance: >3 FB     Dental  (+) Dental Advisory Given, Teeth Intact   Pulmonary Current Smoker,    Pulmonary exam normal breath sounds clear to auscultation       Cardiovascular hypertension, + dysrhythmias  Rhythm:Regular Rate:Normal     Neuro/Psych  Headaches, PSYCHIATRIC DISORDERS Anxiety Depression    GI/Hepatic negative GI ROS, Neg liver ROS,   Endo/Other  negative endocrine ROS  Renal/GU negative Renal ROS     Musculoskeletal   Abdominal   Peds  Hematology   Anesthesia Other Findings   Reproductive/Obstetrics                            Anesthesia Physical  Anesthesia Plan  ASA: II  Anesthesia Plan: General and Regional   Post-op Pain Management: GA combined w/ Regional for post-op pain   Induction: Intravenous  Airway Management Planned: LMA  Additional Equipment:   Intra-op Plan:   Post-operative Plan: Extubation in OR  Informed Consent:   Dental advisory given  Plan Discussed with: CRNA, Anesthesiologist and Surgeon  Anesthesia Plan Comments:         Anesthesia Quick Evaluation

## 2016-08-22 NOTE — Op Note (Signed)
   Date of Surgery: 08/22/2016  INDICATIONS: Ms. Gina Daniels is a 35 y.o.-year-old female with a left segmental ulna fracture;  The patient did consent to the procedure after discussion of the risks and benefits.  PREOPERATIVE DIAGNOSIS: Left segmental ulna fracture  POSTOPERATIVE DIAGNOSIS: Same.  PROCEDURE: ORIF left segmental ulna fracture  SURGEON: N. Glee ArvinMichael Xu, M.D.  ASSIST: April Chilton SiGreen, RNFA.  ANESTHESIA:  general, regional  IV FLUIDS AND URINE: See anesthesia.  ESTIMATED BLOOD LOSS: minimal mL.  IMPLANTS: Biomet  DRAINS: none  COMPLICATIONS: None.  DESCRIPTION OF PROCEDURE: The patient was brought to the operating room and placed supine on the operating table.  The patient had been signed prior to the procedure and this was documented. The patient had the anesthesia placed by the anesthesiologist.  A time-out was performed to confirm that this was the correct patient, site, side and location. The patient did receive antibiotics prior to the incision and was re-dosed during the procedure as needed at indicated intervals.  A tourniquet was placed.  The patient had the operative extremity prepped and draped in the standard surgical fashion.    A directly ulnar incision over the length of the ulna shaft was created. A subcutaneous approach to the ulna was performed. The musculature was elevated off the volar aspect of the ulna. The fractures were exposed. The fractures were then reduced. There was significant amount of comminution within the fracture. I was not able to lag any of the fractures. With the 2 fractures in line I then placed a 12 hole plate on the volar aspect of the ulna. Fluoroscopy was used to confirm the appropriate placement. We then placed nonlocking screws in each segment of the ulna. 3 screws were placed in each segment of the ulna. Final x-rays were taken. She had full pronation and supination after the fixation. The wounds were thoroughly irrigated and closed in  layer fashion using 0 Vicryl, 3-0 Vicryl, 3-0 nylon. Sterile dressings were applied. Arm was placed in a short arm splint. Patient tolerated procedure well and no immediate complications.  POSTOPERATIVE PLAN: Patient will be nonweightbearing to the left upper extremity.  Mayra ReelN. Michael Xu, MD [facility] [phone] 1:21 PM

## 2016-08-22 NOTE — Discharge Instructions (Signed)
Postoperative instructions: ° °Weightbearing: non weight bearing ° °Dressing instructions: Keep your dressing and/or splint clean and dry at all times.  It will be removed at your first post-operative appointment.  Your stitches and/or staples will be removed at this visit. ° °Incision instructions:  Do not soak your incision for 3 weeks after surgery.  If the incision gets wet, pat dry and do not scrub the incision. ° °Pain control:  You have been given a prescription to be taken as directed for post-operative pain control.  In addition, elevate the operative extremity above the heart at all times to prevent swelling and throbbing pain. ° °Take over-the-counter Colace, 100mg by mouth twice a day while taking narcotic pain medications to help prevent constipation. ° °Follow up appointments: °1) 10-14 days for suture removal and wound check. °2) Dr. Amethyst Gainer as scheduled. ° ° ------------------------------------------------------------------------------------------------------------- ° °After Surgery Pain Control: ° °After your surgery, post-surgical discomfort or pain is likely. This discomfort can last several days to a few weeks. At certain times of the day your discomfort may be more intense.  °Did you receive a nerve block?  °A nerve block can provide pain relief for one hour to two days after your surgery. As long as the nerve block is working, you will experience little or no sensation in the area the surgeon operated on.  °As the nerve block wears off, you will begin to experience pain or discomfort. It is very important that you begin taking your prescribed pain medication before the nerve block fully wears off. Treating your pain at the first sign of the block wearing off will ensure your pain is better controlled and more tolerable when full-sensation returns. Do not wait until the pain is intolerable, as the medicine will be less effective. It is better to treat pain in advance than to try and catch up.    °General Anesthesia:  °If you did not receive a nerve block during your surgery, you will need to start taking your pain medication shortly after your surgery and should continue to do so as prescribed by your surgeon.  °Pain Medication:  °Most commonly we prescribe Vicodin and Percocet for post-operative pain. Both of these medications contain a combination of acetaminophen (Tylenol®) and a narcotic to help control pain.  °· It takes between 30 and 45 minutes before pain medication starts to work. It is important to take your medication before your pain level gets too intense.  °· Nausea is a common side effect of many pain medications. You will want to eat something before taking your pain medicine to help prevent nausea.  °· If you are taking a prescription pain medication that contains acetaminophen, we recommend that you do not take additional over the counter acetaminophen (Tylenol®).  °Other pain relieving options:  °· Using a cold pack to ice the affected area a few times a day (15 to 20 minutes at a time) can help to relieve pain, reduce swelling and bruising.  °· Elevation of the affected area can also help to reduce pain and swelling. ° ° ° °

## 2016-08-22 NOTE — Anesthesia Procedure Notes (Signed)
Anesthesia Regional Block:  Supraclavicular block  Pre-Anesthetic Checklist: ,, timeout performed, Correct Patient, Correct Site, Correct Laterality, Correct Procedure, Correct Position, site marked, Risks and benefits discussed,  Surgical consent,  Pre-op evaluation,  At surgeon's request and post-op pain management  Laterality: Left  Prep: chloraprep       Needles:  Injection technique: Single-shot  Needle Type: Stimiplex     Needle Length: 5cm 5 cm Needle Gauge: 21 G    Additional Needles:  Procedures: ultrasound guided (picture in chart) Supraclavicular block Narrative:  Injection made incrementally with aspirations every 5 mL.  Performed by: Personally  Anesthesiologist: Ladainian Therien  Additional Notes: Risks, benefits and alternative to block explained extensively.  Patient tolerated procedure well, without complications.

## 2016-08-23 ENCOUNTER — Encounter (HOSPITAL_COMMUNITY): Payer: Self-pay | Admitting: Orthopaedic Surgery

## 2016-08-23 NOTE — Anesthesia Postprocedure Evaluation (Signed)
Anesthesia Post Note  Patient: Gina Daniels  Procedure(s) Performed: Procedure(s) (LRB): OPEN REDUCTION INTERNAL FIXATION (ORIF)  LEFT ULNA FRACTURE (Left)  Patient location during evaluation: PACU Anesthesia Type: General and Regional Level of consciousness: awake and alert Pain management: pain level controlled Vital Signs Assessment: post-procedure vital signs reviewed and stable Respiratory status: spontaneous breathing, nonlabored ventilation, respiratory function stable and patient connected to nasal cannula oxygen Cardiovascular status: blood pressure returned to baseline and stable Postop Assessment: no signs of nausea or vomiting Anesthetic complications: no    Last Vitals:  Vitals:   08/22/16 1530 08/22/16 1541  BP:  124/86  Pulse: (!) 102 99  Resp: 18   Temp:  36.6 C    Last Pain:  Vitals:   08/22/16 1541  TempSrc:   PainSc: 0-No pain                 Reino KentJudd, Sonja Manseau J

## 2016-09-24 ENCOUNTER — Ambulatory Visit (INDEPENDENT_AMBULATORY_CARE_PROVIDER_SITE_OTHER): Payer: Self-pay | Admitting: Orthopaedic Surgery

## 2016-09-25 ENCOUNTER — Ambulatory Visit (INDEPENDENT_AMBULATORY_CARE_PROVIDER_SITE_OTHER): Payer: Self-pay | Admitting: Orthopaedic Surgery

## 2016-10-01 ENCOUNTER — Ambulatory Visit (INDEPENDENT_AMBULATORY_CARE_PROVIDER_SITE_OTHER): Payer: 59 | Admitting: Orthopaedic Surgery

## 2016-10-01 DIAGNOSIS — S52262D Displaced segmental fracture of shaft of ulna, left arm, subsequent encounter for closed fracture with routine healing: Secondary | ICD-10-CM | POA: Diagnosis not present

## 2016-10-16 ENCOUNTER — Encounter (HOSPITAL_COMMUNITY): Payer: Self-pay

## 2016-10-16 ENCOUNTER — Emergency Department (HOSPITAL_COMMUNITY)
Admission: EM | Admit: 2016-10-16 | Discharge: 2016-10-17 | Disposition: A | Discharge status: Other Acute Inpatient Hospital | Payer: 59 | Attending: Emergency Medicine | Admitting: Emergency Medicine

## 2016-10-16 DIAGNOSIS — I10 Essential (primary) hypertension: Secondary | ICD-10-CM | POA: Insufficient documentation

## 2016-10-16 DIAGNOSIS — F17210 Nicotine dependence, cigarettes, uncomplicated: Secondary | ICD-10-CM | POA: Diagnosis not present

## 2016-10-16 DIAGNOSIS — Z91040 Latex allergy status: Secondary | ICD-10-CM | POA: Insufficient documentation

## 2016-10-16 DIAGNOSIS — R45851 Suicidal ideations: Secondary | ICD-10-CM

## 2016-10-16 DIAGNOSIS — Z8249 Family history of ischemic heart disease and other diseases of the circulatory system: Secondary | ICD-10-CM | POA: Diagnosis not present

## 2016-10-16 DIAGNOSIS — Z833 Family history of diabetes mellitus: Secondary | ICD-10-CM | POA: Diagnosis not present

## 2016-10-16 DIAGNOSIS — F332 Major depressive disorder, recurrent severe without psychotic features: Secondary | ICD-10-CM | POA: Diagnosis not present

## 2016-10-16 DIAGNOSIS — Z79899 Other long term (current) drug therapy: Secondary | ICD-10-CM | POA: Diagnosis not present

## 2016-10-16 DIAGNOSIS — F339 Major depressive disorder, recurrent, unspecified: Secondary | ICD-10-CM

## 2016-10-16 DIAGNOSIS — Z7982 Long term (current) use of aspirin: Secondary | ICD-10-CM | POA: Diagnosis not present

## 2016-10-16 DIAGNOSIS — F1010 Alcohol abuse, uncomplicated: Secondary | ICD-10-CM | POA: Diagnosis not present

## 2016-10-16 LAB — COMPREHENSIVE METABOLIC PANEL
ALT: 116 U/L — ABNORMAL HIGH (ref 14–54)
AST: 135 U/L — ABNORMAL HIGH (ref 15–41)
Albumin: 4.2 g/dL (ref 3.5–5.0)
Alkaline Phosphatase: 52 U/L (ref 38–126)
Anion gap: 12 (ref 5–15)
BUN: <5 mg/dL — ABNORMAL LOW (ref 6–20)
CO2: 20 mmol/L — ABNORMAL LOW (ref 22–32)
Calcium: 9 mg/dL (ref 8.9–10.3)
Chloride: 105 mmol/L (ref 101–111)
Creatinine, Ser: 0.66 mg/dL (ref 0.44–1.00)
GFR calc Af Amer: >60 mL/min (ref >60)
GFR calc non Af Amer: >60 mL/min (ref >60)
Glucose, Bld: 148 mg/dL — ABNORMAL HIGH (ref 65–99)
Potassium: 3.8 mmol/L (ref 3.5–5.1)
Sodium: 137 mmol/L (ref 135–145)
Total Bilirubin: 0.6 mg/dL (ref 0.3–1.2)
Total Protein: 7 g/dL (ref 6.5–8.1)

## 2016-10-16 LAB — CBC
HCT: 47.3 % — ABNORMAL HIGH (ref 36.0–46.0)
Hemoglobin: 16.2 g/dL — ABNORMAL HIGH (ref 12.0–15.0)
MCH: 34.8 pg — ABNORMAL HIGH (ref 26.0–34.0)
MCHC: 34.2 g/dL (ref 30.0–36.0)
MCV: 101.7 fL — ABNORMAL HIGH (ref 78.0–100.0)
Platelets: 209 10*3/uL (ref 150–400)
RBC: 4.65 MIL/uL (ref 3.87–5.11)
RDW: 13.2 % (ref 11.5–15.5)
WBC: 13.2 10*3/uL — ABNORMAL HIGH (ref 4.0–10.5)

## 2016-10-16 LAB — RAPID URINE DRUG SCREEN, HOSP PERFORMED
Amphetamines: NOT DETECTED
Barbiturates: NOT DETECTED
Benzodiazepines: POSITIVE — AB
Cocaine: NOT DETECTED
Opiates: NOT DETECTED
Tetrahydrocannabinol: POSITIVE — AB

## 2016-10-16 LAB — I-STAT BETA HCG BLOOD, ED (MC, WL, AP ONLY): I-stat hCG, quantitative: <5 m[IU]/mL (ref <5)

## 2016-10-16 LAB — ETHANOL: Alcohol, Ethyl (B): 137 mg/dL — ABNORMAL HIGH (ref <5)

## 2016-10-16 LAB — ACETAMINOPHEN LEVEL: Acetaminophen (Tylenol), Serum: <10 ug/mL — ABNORMAL LOW (ref 10–30)

## 2016-10-16 LAB — SALICYLATE LEVEL: Salicylate Lvl: <7 mg/dL (ref 2.8–30.0)

## 2016-10-16 MED ORDER — OLANZAPINE-FLUOXETINE HCL 3-25 MG PO CAPS
1.0000 | ORAL_CAPSULE | Freq: Every evening | ORAL | Status: DC
  Filled 2016-10-16: qty 1

## 2016-10-16 MED ORDER — VENLAFAXINE HCL ER 75 MG PO CP24
75.0000 mg | ORAL_CAPSULE | Freq: Every day | ORAL | Status: DC
  Administered 2016-10-17: 75 mg via ORAL
  Filled 2016-10-16: qty 1

## 2016-10-16 MED ORDER — ONDANSETRON HCL 4 MG PO TABS: 4.0000 mg | ORAL_TABLET | Freq: Three times a day (TID) | ORAL | Status: DC | PRN

## 2016-10-16 MED ORDER — LORAZEPAM 1 MG PO TABS: 0.0000 mg | ORAL_TABLET | Freq: Two times a day (BID) | ORAL | Status: DC

## 2016-10-16 MED ORDER — LAMOTRIGINE 100 MG PO TABS
100.0000 mg | ORAL_TABLET | Freq: Every morning | ORAL | Status: DC
  Administered 2016-10-17: 100 mg via ORAL
  Filled 2016-10-16: qty 1

## 2016-10-16 MED ORDER — NICOTINE 21 MG/24HR TD PT24
21.0000 mg | MEDICATED_PATCH | Freq: Every day | TRANSDERMAL | Status: DC
  Administered 2016-10-16: 21 mg via TRANSDERMAL
  Filled 2016-10-16 (×2): qty 1

## 2016-10-16 MED ORDER — MIRTAZAPINE 30 MG PO TABS
15.0000 mg | ORAL_TABLET | Freq: Every day | ORAL | Status: DC
  Administered 2016-10-16: 15 mg via ORAL
  Filled 2016-10-16: qty 1

## 2016-10-16 MED ORDER — IBUPROFEN 200 MG PO TABS
600.0000 mg | ORAL_TABLET | Freq: Three times a day (TID) | ORAL | Status: DC | PRN
  Administered 2016-10-17: 600 mg via ORAL
  Filled 2016-10-16: qty 3

## 2016-10-16 MED ORDER — LORAZEPAM 1 MG PO TABS: 1.0000 mg | ORAL_TABLET | Freq: Three times a day (TID) | ORAL | Status: DC | PRN

## 2016-10-16 MED ORDER — LORAZEPAM 1 MG PO TABS
0.0000 mg | ORAL_TABLET | Freq: Four times a day (QID) | ORAL | Status: DC
  Administered 2016-10-16 – 2016-10-17 (×3): 1 mg via ORAL
  Filled 2016-10-16 (×3): qty 1

## 2016-10-16 MED ORDER — CHLORDIAZEPOXIDE HCL 25 MG PO CAPS
25.0000 mg | ORAL_CAPSULE | Freq: Three times a day (TID) | ORAL | Status: DC
  Administered 2016-10-16 – 2016-10-17 (×2): 25 mg via ORAL
  Filled 2016-10-16 (×2): qty 1

## 2016-10-16 MED ORDER — ALUM & MAG HYDROXIDE-SIMETH 200-200-20 MG/5ML PO SUSP: 30.0000 mL | ORAL | Status: DC | PRN

## 2016-10-16 NOTE — ED Notes (Signed)
Bed: [facility] Expected date:  Expected time:  Means of arrival:  Comments: 

## 2016-10-16 NOTE — ED Notes (Signed)
Bed: [facility]36 Expected date:  Expected time:  Means of arrival:  Comments: Triage 3 
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
## 2016-10-16 NOTE — ED Notes (Signed)
Bed: [facility]3 Expected date:  Expected time:  Means of arrival:  Comments: 
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
## 2016-10-16 NOTE — ED Provider Notes (Signed)
[facility]-EMERGENCY DEPT Provider Note   CSN: [id number] Arrival date & time: 10/16/16  1648     History   Chief Complaint Chief Complaint  Patient presents with  . Suicidal    HPI Gina Daniels is a 35 y.o. female.  HPI   Pt with hx "treatment resistant" MDD, PTSD, panic disorder, eat disorder p/w worsening depression with suicidal ideation with plan to overdose on pills.  She states "it all hit me last night."  She became overwhelmed and wanted to overdose on pills, gave her husband all of her pills and asked him to lock them in his work [employer] and take them away.  Denies having done anything to hurt herself since 1 month ago when she cut her left arm (well healed now).   Denies any physical complaints.    Past Medical History:  Diagnosis Date  . Anorexia   . Anorexia nervosa   . Anxiety   . Depression   . 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 Gallstones   . Headache   . Hypertension   . PTSD (post-traumatic stress disorder)     Patient Active Problem List   Diagnosis Date Noted  . Postoperative wound dehiscence 10/05/2014    Past Surgical History:  Procedure Laterality Date  . CHALAZION EXCISION     2012 LEFT EYE  . FOOT SURGERY     RIGHT   . INCISION AND DRAINAGE Right 10/05/2014   Procedure: INCISION AND DRAINAGE RIGHT FOOT WOUND WITH APPLICATION OF WOUND VAC;  Surgeon: Toni Arthurs, MD;  Location: [facility] OR;  Service: Orthopedics;  Laterality: Right;  . ORIF ULNAR FRACTURE Left 08/22/2016   Procedure: OPEN REDUCTION INTERNAL FIXATION (ORIF)  LEFT ULNA FRACTURE;  Surgeon: Tarry Kos, MD;  Location: [facility] OR;  Service: Orthopedics;  Laterality: Left;  . TUBAL LIGATION  2002  . tubal reversal     02/2014  . WISDOM TOOTH EXTRACTION  2002    OB History    No data available       Home Medications    Prior to Admission medications   Medication Sig Start Date End Date Taking? Authorizing Provider  Aspirin-Salicylamide-Caffeine (BC HEADACHE  POWDER PO) Take 1 packet by mouth daily as needed (headache).   Yes Historical Provider, MD  chlordiazePOXIDE (LIBRIUM) 25 MG capsule Take 25 mg by mouth 3 (three) times daily.    Yes Historical Provider, MD  desvenlafaxine (PRISTIQ) 50 MG 24 hr tablet Take 50 mg by mouth daily. 04/30/16  Yes Historical Provider, MD  desvenlafaxine (PRISTIQ) 50 MG 24 hr tablet Take 50 mg by mouth daily.   Yes Historical Provider, MD  lamoTRIgine (LAMICTAL) 100 MG tablet Take 100 mg by mouth every morning.  05/15/16  Yes Historical Provider, MD  mirtazapine (REMERON) 15 MG tablet Take 15 mg by mouth at bedtime.   Yes Historical Provider, MD  methocarbamol (ROBAXIN) 750 MG tablet Take 1 tablet (750 mg total) by mouth 2 (two) times daily as needed for muscle spasms. Patient not taking: Reported on 10/16/2016 08/22/16   Tarry Kos, MD  OLANZapine-FLUoxetine (SYMBYAX) 3-25 MG capsule Take 1 capsule by mouth every evening.    Historical Provider, MD  ondansetron (ZOFRAN) 4 MG tablet Take 1-2 tablets (4-8 mg total) by mouth every 8 (eight) hours as needed for nausea or vomiting. Patient not taking: Reported on 10/16/2016 08/22/16   Tarry Kos, MD  oxyCODONE-acetaminophen (PERCOCET) 5-325 MG tablet Take 1-2 tablets by  mouth every 4 (four) hours as needed for severe pain. Patient not taking: Reported on 10/16/2016 08/22/16   Tarry KosNaiping M Xu, MD  senna-docusate (SENOKOT S) 8.6-50 MG tablet Take 1 tablet by mouth at bedtime as needed. Patient not taking: Reported on 10/16/2016 08/22/16   Tarry KosNaiping M Xu, MD    Family History Family History  Problem Relation Age of Onset  . Hypertension Mother   . Hypertension Father   . Heart attack Father   . Diabetes Maternal Grandmother     Social History Social History  Substance Use Topics  . Smoking status: Current Every Day Smoker    Packs/day: 1.00    Years: 21.00    Types: Cigarettes  . Smokeless tobacco: Never Used  . Alcohol use Yes     Comment: OCC     Allergies     Latex; Penicillins; Codeine; Sulfa antibiotics; Morphine and related; and Tape   Review of Systems Review of Systems  All other systems reviewed and are negative.    Physical Exam Updated Vital Signs BP 140/95 (BP Location: Left Arm)   Pulse 106   Temp 98.6 F (37 C) (Oral)   Resp 16   Ht 5\' 5"  (1.651 m)   Wt 66.2 kg   LMP 10/12/2016   SpO2 97%   BMI 24.30 kg/m   Physical Exam  Constitutional: She appears well-developed and well-nourished. No distress.  Disheveled, unkempt   HENT:  Head: Normocephalic and atraumatic.  Neck: Neck supple.  Cardiovascular: Normal rate and regular rhythm.   Pulmonary/Chest: Effort normal and breath sounds normal. No respiratory distress. She has no wheezes. She has no rales.  Abdominal: Soft. She exhibits no distension. There is no tenderness. There is no rebound and no guarding.  Neurological: She is alert.  Skin: She is not diaphoretic.  Psychiatric: She exhibits a depressed mood. She expresses suicidal ideation. She expresses suicidal plans.  Tearful   Nursing note and vitals reviewed.    ED Treatments / Results  Labs (all labs ordered are listed, but only abnormal results are displayed) Labs Reviewed  COMPREHENSIVE METABOLIC PANEL - Abnormal; Notable for the following:       Result Value   CO2 20 (*)    Glucose, Bld 148 (*)    BUN <5 (*)    AST 135 (*)    ALT 116 (*)    All other components within normal limits  ETHANOL - Abnormal; Notable for the following:    Alcohol, Ethyl (B) 137 (*)    All other components within normal limits  ACETAMINOPHEN LEVEL - Abnormal; Notable for the following:    Acetaminophen (Tylenol), Serum <10 (*)    All other components within normal limits  CBC - Abnormal; Notable for the following:    WBC 13.2 (*)    Hemoglobin 16.2 (*)    HCT 47.3 (*)    MCV 101.7 (*)    MCH 34.8 (*)    All other components within normal limits  RAPID URINE DRUG SCREEN, HOSP PERFORMED - Abnormal; Notable for the  following:    Benzodiazepines POSITIVE (*)    Tetrahydrocannabinol POSITIVE (*)    All other components within normal limits  SALICYLATE LEVEL  I-STAT BETA HCG BLOOD, ED ([facility], [facility], [facility] ONLY)    EKG  EKG Interpretation None       Radiology No results found.  Procedures Procedures (including critical care time)  Medications Ordered in ED Medications  LORazepam (ATIVAN) tablet 0-4 mg (not administered)  Followed by  LORazepam (ATIVAN) tablet 0-4 mg (not administered)  LORazepam (ATIVAN) tablet 1 mg (not administered)  ibuprofen (ADVIL,MOTRIN) tablet 600 mg (not administered)  nicotine (NICODERM CQ - dosed in mg/24 hours) patch 21 mg (not administered)  ondansetron (ZOFRAN) tablet 4 mg (not administered)  alum & mag hydroxide-simeth (MAALOX/MYLANTA) 200-200-20 MG/5ML suspension 30 mL (not administered)  venlafaxine XR (EFFEXOR-XR) 24 hr capsule 75 mg (not administered)  lamoTRIgine (LAMICTAL) tablet 100 mg (not administered)  mirtazapine (REMERON) tablet 15 mg (not administered)  OLANZapine-FLUoxetine (SYMBYAX) 3-25 MG per capsule 1 capsule (not administered)  chlordiazePOXIDE (LIBRIUM) capsule 25 mg (not administered)     Initial Impression / Assessment and Plan / ED Course  I have reviewed the triage vital signs and the nursing notes.  Pertinent labs & imaging results that were available during my care of the patient were reviewed by me and considered in my medical decision making (see chart for details).  Pt with hx MDD p/w worsening depression and SI with plan. Labs with changes consistent with mild dehydration, alcohol use. Medically cleared for inpatient psychiatric treatment.     Final Clinical Impressions(s) / ED Diagnoses   Final diagnoses:  Episode of recurrent major depressive disorder, unspecified depression episode severity (HCC)  Suicidal ideation    New Prescriptions New Prescriptions   No medications on file     Trixie Dredge, PA-C 10/16/16 2036      Raeford Razor, MD 10/17/16 1443

## 2016-10-17 ENCOUNTER — Inpatient Hospital Stay (HOSPITAL_COMMUNITY)
Admission: AD | Admit: 2016-10-17 | Discharge: 2016-10-22 | DRG: 885 | Disposition: A | Discharge status: Home / Self Care | Payer: 59 | Source: Intra-hospital | Attending: Psychiatry | Admitting: Psychiatry

## 2016-10-17 ENCOUNTER — Encounter (HOSPITAL_COMMUNITY): Payer: Self-pay | Admitting: *Deleted

## 2016-10-17 DIAGNOSIS — F410 Panic disorder [episodic paroxysmal anxiety] without agoraphobia: Secondary | ICD-10-CM | POA: Diagnosis present

## 2016-10-17 DIAGNOSIS — F4310 Post-traumatic stress disorder, unspecified: Secondary | ICD-10-CM | POA: Diagnosis present

## 2016-10-17 DIAGNOSIS — F411 Generalized anxiety disorder: Secondary | ICD-10-CM | POA: Diagnosis present

## 2016-10-17 DIAGNOSIS — Z8249 Family history of ischemic heart disease and other diseases of the circulatory system: Secondary | ICD-10-CM

## 2016-10-17 DIAGNOSIS — Z79899 Other long term (current) drug therapy: Secondary | ICD-10-CM | POA: Diagnosis not present

## 2016-10-17 DIAGNOSIS — F17210 Nicotine dependence, cigarettes, uncomplicated: Secondary | ICD-10-CM | POA: Diagnosis present

## 2016-10-17 DIAGNOSIS — Z915 Personal history of self-harm: Secondary | ICD-10-CM | POA: Diagnosis not present

## 2016-10-17 DIAGNOSIS — R45851 Suicidal ideations: Secondary | ICD-10-CM

## 2016-10-17 DIAGNOSIS — F1010 Alcohol abuse, uncomplicated: Secondary | ICD-10-CM | POA: Diagnosis present

## 2016-10-17 DIAGNOSIS — Z880 Allergy status to penicillin: Secondary | ICD-10-CM | POA: Diagnosis not present

## 2016-10-17 DIAGNOSIS — Z599 Problem related to housing and economic circumstances, unspecified: Secondary | ICD-10-CM

## 2016-10-17 DIAGNOSIS — F1290 Cannabis use, unspecified, uncomplicated: Secondary | ICD-10-CM | POA: Diagnosis present

## 2016-10-17 DIAGNOSIS — Z888 Allergy status to other drugs, medicaments and biological substances status: Secondary | ICD-10-CM

## 2016-10-17 DIAGNOSIS — G4700 Insomnia, unspecified: Secondary | ICD-10-CM | POA: Diagnosis present

## 2016-10-17 DIAGNOSIS — F332 Major depressive disorder, recurrent severe without psychotic features: Secondary | ICD-10-CM | POA: Diagnosis present

## 2016-10-17 DIAGNOSIS — G4710 Hypersomnia, unspecified: Secondary | ICD-10-CM | POA: Diagnosis present

## 2016-10-17 DIAGNOSIS — Z91410 Personal history of adult physical and sexual abuse: Secondary | ICD-10-CM | POA: Diagnosis not present

## 2016-10-17 DIAGNOSIS — F5000 Anorexia nervosa, unspecified: Secondary | ICD-10-CM | POA: Diagnosis present

## 2016-10-17 DIAGNOSIS — Z9889 Other specified postprocedural states: Secondary | ICD-10-CM | POA: Diagnosis not present

## 2016-10-17 DIAGNOSIS — Z91040 Latex allergy status: Secondary | ICD-10-CM

## 2016-10-17 DIAGNOSIS — I10 Essential (primary) hypertension: Secondary | ICD-10-CM | POA: Diagnosis present

## 2016-10-17 DIAGNOSIS — Z882 Allergy status to sulfonamides status: Secondary | ICD-10-CM

## 2016-10-17 DIAGNOSIS — Z833 Family history of diabetes mellitus: Secondary | ICD-10-CM | POA: Diagnosis not present

## 2016-10-17 MED ORDER — VITAMIN B-1 100 MG PO TABS
100.0000 mg | ORAL_TABLET | Freq: Every day | ORAL | Status: DC
  Administered 2016-10-17 – 2016-10-22 (×6): 100 mg via ORAL
  Filled 2016-10-17 (×8): qty 1

## 2016-10-17 MED ORDER — MIRTAZAPINE 15 MG PO TABS
15.0000 mg | ORAL_TABLET | Freq: Every day | ORAL | Status: DC
  Filled 2016-10-17 (×2): qty 1

## 2016-10-17 MED ORDER — MIRTAZAPINE 7.5 MG PO TABS
7.5000 mg | ORAL_TABLET | Freq: Every evening | ORAL | Status: DC | PRN
  Filled 2016-10-17 (×4): qty 1

## 2016-10-17 MED ORDER — ADULT MULTIVITAMIN W/MINERALS CH
1.0000 | ORAL_TABLET | Freq: Every day | ORAL | Status: DC
  Administered 2016-10-17 – 2016-10-22 (×6): 1 via ORAL
  Filled 2016-10-17 (×8): qty 1

## 2016-10-17 MED ORDER — TRAZODONE HCL 50 MG PO TABS
50.0000 mg | ORAL_TABLET | Freq: Every evening | ORAL | Status: DC | PRN
  Filled 2016-10-17 (×2): qty 1

## 2016-10-17 MED ORDER — ONDANSETRON 4 MG PO TBDP
4.0000 mg | ORAL_TABLET | Freq: Four times a day (QID) | ORAL | Status: AC | PRN
Start: 2016-10-17 — End: 2016-10-20

## 2016-10-17 MED ORDER — VENLAFAXINE HCL ER 75 MG PO CP24
75.0000 mg | ORAL_CAPSULE | Freq: Every day | ORAL | Status: DC
  Filled 2016-10-17 (×2): qty 1

## 2016-10-17 MED ORDER — LAMOTRIGINE 100 MG PO TABS
100.0000 mg | ORAL_TABLET | Freq: Two times a day (BID) | ORAL | Status: DC
  Administered 2016-10-17 – 2016-10-22 (×11): 100 mg via ORAL
  Filled 2016-10-17 (×14): qty 1

## 2016-10-17 MED ORDER — LORAZEPAM 1 MG PO TABS: 0.0000 mg | ORAL_TABLET | Freq: Two times a day (BID) | ORAL | Status: DC

## 2016-10-17 MED ORDER — MAGNESIUM HYDROXIDE 400 MG/5ML PO SUSP: 30.0000 mL | Freq: Every day | ORAL | Status: DC | PRN

## 2016-10-17 MED ORDER — LORAZEPAM 1 MG PO TABS
1.0000 mg | ORAL_TABLET | Freq: Two times a day (BID) | ORAL | Status: AC
  Administered 2016-10-19 (×2): 1 mg via ORAL
  Filled 2016-10-17 (×2): qty 1

## 2016-10-17 MED ORDER — LORAZEPAM 1 MG PO TABS
1.0000 mg | ORAL_TABLET | Freq: Every day | ORAL | Status: AC
  Filled 2016-10-17: qty 1

## 2016-10-17 MED ORDER — LAMOTRIGINE 100 MG PO TABS
100.0000 mg | ORAL_TABLET | Freq: Every morning | ORAL | Status: DC
  Filled 2016-10-17 (×2): qty 1

## 2016-10-17 MED ORDER — LOPERAMIDE HCL 2 MG PO CAPS
2.0000 mg | ORAL_CAPSULE | ORAL | Status: AC | PRN
  Administered 2016-10-17: 4 mg via ORAL
  Filled 2016-10-17: qty 2

## 2016-10-17 MED ORDER — ALUM & MAG HYDROXIDE-SIMETH 200-200-20 MG/5ML PO SUSP: 30.0000 mL | ORAL | Status: DC | PRN

## 2016-10-17 MED ORDER — IBUPROFEN 600 MG PO TABS
600.0000 mg | ORAL_TABLET | Freq: Three times a day (TID) | ORAL | Status: DC | PRN
  Administered 2016-10-17 – 2016-10-20 (×5): 600 mg via ORAL
  Filled 2016-10-17 (×5): qty 1

## 2016-10-17 MED ORDER — ACETAMINOPHEN 325 MG PO TABS
650.0000 mg | ORAL_TABLET | Freq: Four times a day (QID) | ORAL | Status: DC | PRN
  Administered 2016-10-18 – 2016-10-21 (×3): 650 mg via ORAL
  Filled 2016-10-17 (×3): qty 2

## 2016-10-17 MED ORDER — OLANZAPINE-FLUOXETINE HCL 3-25 MG PO CAPS
1.0000 | ORAL_CAPSULE | Freq: Every evening | ORAL | Status: DC
  Filled 2016-10-17: qty 1

## 2016-10-17 MED ORDER — PRAZOSIN HCL 2 MG PO CAPS
2.0000 mg | ORAL_CAPSULE | Freq: Every day | ORAL | Status: DC
  Administered 2016-10-17: 2 mg via ORAL
  Filled 2016-10-17 (×2): qty 1

## 2016-10-17 MED ORDER — VENLAFAXINE HCL ER 150 MG PO CP24
150.0000 mg | ORAL_CAPSULE | Freq: Every day | ORAL | Status: DC
  Administered 2016-10-18 – 2016-10-20 (×3): 150 mg via ORAL
  Filled 2016-10-17 (×5): qty 1

## 2016-10-17 MED ORDER — NICOTINE 21 MG/24HR TD PT24
21.0000 mg | MEDICATED_PATCH | Freq: Every day | TRANSDERMAL | Status: DC
  Administered 2016-10-18 – 2016-10-22 (×5): 21 mg via TRANSDERMAL
  Filled 2016-10-17 (×7): qty 1

## 2016-10-17 MED ORDER — LORAZEPAM 1 MG PO TABS
1.0000 mg | ORAL_TABLET | Freq: Three times a day (TID) | ORAL | Status: AC
  Administered 2016-10-18 (×3): 1 mg via ORAL
  Filled 2016-10-17 (×4): qty 1

## 2016-10-17 MED ORDER — LORAZEPAM 1 MG PO TABS: 0.0000 mg | ORAL_TABLET | Freq: Four times a day (QID) | ORAL | Status: DC

## 2016-10-17 MED ORDER — LORAZEPAM 1 MG PO TABS
1.0000 mg | ORAL_TABLET | Freq: Four times a day (QID) | ORAL | Status: AC
  Administered 2016-10-17 (×3): 1 mg via ORAL
  Filled 2016-10-17 (×2): qty 1

## 2016-10-17 MED ORDER — ONDANSETRON HCL 4 MG PO TABS: 4.0000 mg | ORAL_TABLET | Freq: Three times a day (TID) | ORAL | Status: DC | PRN

## 2016-10-17 MED ORDER — HYDROXYZINE HCL 25 MG PO TABS
25.0000 mg | ORAL_TABLET | Freq: Four times a day (QID) | ORAL | Status: AC | PRN
  Administered 2016-10-18 – 2016-10-20 (×4): 25 mg via ORAL
  Filled 2016-10-17 (×4): qty 1
  Filled 2016-10-17: qty 2

## 2016-10-17 NOTE — Tx Team (Addendum)
Initial Treatment Plan 10/17/2016 1:06 PM Gina Daniels [id number]    PATIENT STRESSORS: Financial difficulties Health problems Substance abuse   PATIENT STRENGTHS: Average or above average intelligence Capable of independent living Communication skills Supportive family/friends   PATIENT IDENTIFIED PROBLEMS: Depression  Suicidal Ideation w/plan to overdose  Financial difficulty  Health issues  "I feel overwhelmed."  "Everything hit me at one time."   depression         DISCHARGE CRITERIA:  Improved stabilization in mood, thinking, and/or behavior Motivation to continue treatment in a less acute level of care Reduction of life-threatening or endangering symptoms to within safe limits Verbal commitment to aftercare and medication compliance Withdrawal symptoms are absent or subacute and managed without 24-hour nursing intervention  PRELIMINARY DISCHARGE PLAN: Attend 12-step recovery group Outpatient therapy Return to previous living arrangement  PATIENT/FAMILY INVOLVEMENT: This treatment plan has been presented to and reviewed with the patient, Gina BoehringerJenny B Daniels.  The patient and family have been given the opportunity to ask questions and make suggestions.  Cranford MonBeaudry, Caroline Evans, RN 10/17/2016, 1:06 PM

## 2016-10-17 NOTE — Consult Note (Addendum)
[place] Psychiatry Consult   Reason for Consult:  Suicidal  Referring Physician:  EDP Patient Identification: Gina Daniels MRN:  [id number] Principal Diagnosis: Major depressive disorder, recurrent severe without psychotic features [facility]) Diagnosis:   Patient Active Problem List   Diagnosis Date Noted  . Alcohol abuse [F10.10] 10/17/2016    Priority: High  . Major depressive disorder, recurrent severe without psychotic features ([place]) [F33.2] 10/17/2016    Priority: High  . Postoperative wound dehiscence [T81.31XA] 10/05/2014    Total Time spent with patient: 45 minutes  Subjective:   Gina Daniels is a 35 y.o. female patient admitted with suicide plan to overdose.  HPI:  35 yo female who presented to the ED with suicidal ideations and a plan to overdose.  Her husband has been taking her medications to work with him to prevent her from overdosing.  Last night, she was drinking and her suicidal ideations increased.  Today, she continues to endorse depression with" hopelessness and helplessness."  Reports an increase in drinking recently.  No hallucinations, homicidal ideations, or withdrawal symptoms.  Past Psychiatric History: depression  Risk to Self: Suicidal Ideation: Yes-Currently Present Suicidal Intent: Yes-Currently Present Is patient at risk for suicide?: Yes Suicidal Plan?: Yes-Currently Present Specify Current Suicidal Plan:  ("take my sleeping pills and all my muscle relaxers") Access to Means: No (spouse took all medications out of home ) What has been your use of drugs/alcohol within the last 12 months?:  (THC) How many times?:  ("several times when I was younger") Other Self Harm Risks:  ("I am a cutter") Triggers for Past Attempts: Other (Comment) (worsening depression ) Intentional Self Injurious Behavior: Cutting Comment - Self Injurious Behavior:  (history of cutting -last episode July 2017) Risk to Others: Homicidal Ideation: No Thoughts of Harm  to Others: No Current Homicidal Intent: No Current Homicidal Plan: No Access to Homicidal Means: No Identified Victim:  (n/a) History of harm to others?: No Assessment of Violence: None Noted Violent Behavior Description:  (patient currently calm and cooperative ) Does patient have access to weapons?: No Criminal Charges Pending?: No Does patient have a court date: No Prior Inpatient Therapy: Prior Inpatient Therapy: Yes Prior Therapy Dates:  ("I was 35 yrs old") Prior Therapy Facilty/Provider(s):  ([place]) Reason for Treatment:  (suicidal thoughts, depression, etc. ) Prior Outpatient Therapy: Prior Outpatient Therapy: Yes Prior Therapy Dates:  (current) Prior Therapy Facilty/Provider(s):  (Dr. Kerin Salen ) Reason for Treatment:  (outpatient medication managment ) Does patient have an ACCT team?: No Does patient have Intensive In-House Services?  : No Does patient have [facility] services? : No Does patient have P4CC services?: No  Past Medical History:  Past Medical History:  Diagnosis Date  . Anorexia   . Anorexia nervosa   . Anxiety   . Depression   . 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 Gallstones   . Headache   . Hypertension   . PTSD (post-traumatic stress disorder)     Past Surgical History:  Procedure Laterality Date  . CHALAZION EXCISION     2012 LEFT EYE  . FOOT SURGERY     RIGHT   . INCISION AND DRAINAGE Right 10/05/2014   Procedure: INCISION AND DRAINAGE RIGHT FOOT WOUND WITH APPLICATION OF WOUND VAC;  Surgeon: Wylene Simmer, MD;  Location: [place];  Service: Orthopedics;  Laterality: Right;  . ORIF ULNAR FRACTURE Left 08/22/2016   Procedure: OPEN REDUCTION INTERNAL FIXATION (ORIF)  LEFT ULNA FRACTURE;  Surgeon: Tarry Kos, MD;  [facility];  Service: Orthopedics;  Laterality: Left;  . TUBAL LIGATION  2002  . tubal reversal     02/2014  . WISDOM TOOTH EXTRACTION  2002   Family History:  Family History  Problem  Relation Age of Onset  . Hypertension Mother   . Hypertension Father   . Heart attack Father   . Diabetes Maternal Grandmother    Family Psychiatric  History: none Social History:  History  Alcohol Use  . Yes    Comment: OCC     History  Drug Use  . Types: Marijuana    Comment: daily    Social History   Social History  . Marital status: Married    Spouse name: N/A  . Number of children: N/A  . Years of education: N/A   Social History Main Topics  . Smoking status: Current Every Day Smoker    Packs/day: 1.00    Years: 21.00    Types: Cigarettes  . Smokeless tobacco: Never Used  . Alcohol use Yes     Comment: OCC  . Drug use:     Types: Marijuana     Comment: daily  . Sexual activity: Yes    Birth control/ protection: Other-see comments   Other Topics Concern  . None   Social History Narrative  . None   Additional Social History:    Allergies:   Allergies  Allergen Reactions  . Latex Anaphylaxis  . Penicillins Anaphylaxis    Has patient had a PCN reaction causing immediate rash, facial/tongue/throat swelling, SOB or lightheadedness with hypotension:  yes Has patient had a PCN reaction causing severe rash involving mucus membranes or skin necrosis: no Has patient had a PCN reaction that required hospitalization: yes Has patient had a PCN reaction occurring within the last 10 years: yes If all of the above answers are "NO", then may proceed with Cephalosporin use.   . Codeine Hives and Rash  . Sulfa Antibiotics Hives  . Morphine And Related Rash  . Tape Rash    Labs:  Results for orders placed or performed during the hospital encounter of 10/16/16 (from the past 48 hour(s))  Comprehensive metabolic panel     Status: Abnormal   Collection Time: 10/16/16  5:27 PM  Result Value Ref Range   Sodium 137 135 - 145 mmol/L   Potassium 3.8 3.5 - 5.1 mmol/L   Chloride 105 101 - 111 mmol/L   CO2 20 (L) 22 - 32 mmol/L   Glucose, Bld 148 (H) 65 - 99 mg/dL    BUN <5 (L) 6 - 20 mg/dL   Creatinine, Ser 1.78 0.44 - 1.00 mg/dL   Calcium 9.0 8.9 - 41.2 mg/dL   Total Protein 7.0 6.5 - 8.1 g/dL   Albumin 4.2 3.5 - 5.0 g/dL   AST 717 (H) 15 - 41 U/L   ALT 116 (H) 14 - 54 U/L   Alkaline Phosphatase 52 38 - 126 U/L   Total Bilirubin 0.6 0.3 - 1.2 mg/dL   GFR calc non Af Amer >60 >60 mL/min   GFR calc Af Amer >60 >60 mL/min    Comment: (NOTE) The eGFR has been calculated using the CKD EPI equation. This calculation has not been validated in all clinical situations. eGFR's persistently <60 mL/min signify possible Chronic Kidney Disease.    Anion gap 12 5 - 15  Ethanol     Status: Abnormal   Collection Time:  10/16/16  5:27 PM  Result Value Ref Range   Alcohol, Ethyl (B) 137 (H) <5 mg/dL    Comment:        LOWEST DETECTABLE LIMIT FOR SERUM ALCOHOL IS 5 mg/dL FOR MEDICAL PURPOSES ONLY   Salicylate level     Status: None   Collection Time: 10/16/16  5:27 PM  Result Value Ref Range   Salicylate Lvl <1.6 2.8 - 30.0 mg/dL  Acetaminophen level     Status: Abnormal   Collection Time: 10/16/16  5:27 PM  Result Value Ref Range   Acetaminophen (Tylenol), Serum <10 (L) 10 - 30 ug/mL    Comment:        THERAPEUTIC CONCENTRATIONS VARY SIGNIFICANTLY. A RANGE OF 10-30 ug/mL MAY BE AN EFFECTIVE CONCENTRATION FOR MANY PATIENTS. HOWEVER, SOME ARE BEST TREATED AT CONCENTRATIONS OUTSIDE THIS RANGE. ACETAMINOPHEN CONCENTRATIONS >150 ug/mL AT 4 HOURS AFTER INGESTION AND >50 ug/mL AT 12 HOURS AFTER INGESTION ARE OFTEN ASSOCIATED WITH TOXIC REACTIONS.   cbc     Status: Abnormal   Collection Time: 10/16/16  5:27 PM  Result Value Ref Range   WBC 13.2 (H) 4.0 - 10.5 K/uL   RBC 4.65 3.87 - 5.11 MIL/uL   Hemoglobin 16.2 (H) 12.0 - 15.0 g/dL   HCT 47.3 (H) 36.0 - 46.0 %   MCV 101.7 (H) 78.0 - 100.0 fL   MCH 34.8 (H) 26.0 - 34.0 pg   MCHC 34.2 30.0 - 36.0 g/dL   RDW 13.2 11.5 - 15.5 %   Platelets 209 150 - 400 K/uL  I-Stat beta hCG blood, ED     Status:  None   Collection Time: 10/16/16  5:33 PM  Result Value Ref Range   I-stat hCG, quantitative <5.0 <5 mIU/mL   Comment 3            Comment:   GEST. AGE      CONC.  (mIU/mL)   <=1 WEEK        5 - 50     2 WEEKS       50 - 500     3 WEEKS       100 - 10,000     4 WEEKS     1,000 - 30,000        FEMALE AND NON-PREGNANT FEMALE:     LESS THAN 5 mIU/mL   Rapid urine drug screen (hospital performed)     Status: Abnormal   Collection Time: 10/16/16  5:40 PM  Result Value Ref Range   Opiates NONE DETECTED NONE DETECTED   Cocaine NONE DETECTED NONE DETECTED   Benzodiazepines POSITIVE (A) NONE DETECTED   Amphetamines NONE DETECTED NONE DETECTED   Tetrahydrocannabinol POSITIVE (A) NONE DETECTED   Barbiturates NONE DETECTED NONE DETECTED    Comment:        DRUG SCREEN FOR MEDICAL PURPOSES ONLY.  IF CONFIRMATION IS NEEDED FOR ANY PURPOSE, NOTIFY LAB WITHIN 5 DAYS.        LOWEST DETECTABLE LIMITS FOR URINE DRUG SCREEN Drug Class       Cutoff (ng/mL) Amphetamine      1000 Barbiturate      200 Benzodiazepine   073 Tricyclics       710 Opiates          300 Cocaine          300 THC              50     Current Facility-Administered Medications  Medication  Dose Route Frequency Provider Last Rate Last Dose  . alum & mag hydroxide-simeth (MAALOX/MYLANTA) 200-200-20 MG/5ML suspension 30 mL  30 mL Oral PRN Clayton Bibles, PA-C      . chlordiazePOXIDE (LIBRIUM) capsule 25 mg  25 mg Oral TID Clayton Bibles, PA-C   25 mg at 10/17/16 0935  . ibuprofen (ADVIL,MOTRIN) tablet 600 mg  600 mg Oral Q8H PRN Clayton Bibles, PA-C      . lamoTRIgine (LAMICTAL) tablet 100 mg  100 mg Oral q morning - 10a Emily West, PA-C   100 mg at 10/17/16 0935  . LORazepam (ATIVAN) tablet 0-4 mg  0-4 mg Oral Q6H Clayton Bibles, PA-C   1 mg at 10/17/16 0602   Followed by  . [START ON 10/18/2016] LORazepam (ATIVAN) tablet 0-4 mg  0-4 mg Oral Q12H Clayton Bibles, PA-C      . LORazepam (ATIVAN) tablet 1 mg  1 mg Oral Q8H PRN Clayton Bibles, PA-C       . mirtazapine (REMERON) tablet 15 mg  15 mg Oral QHS Emily West, PA-C   15 mg at 10/16/16 2212  . nicotine (NICODERM CQ - dosed in mg/24 hours) patch 21 mg  21 mg Transdermal Daily Emily West, PA-C   21 mg at 10/16/16 2211  . OLANZapine-FLUoxetine (SYMBYAX) 3-25 MG per capsule 1 capsule  1 capsule Oral QPM Emily West, PA-C      . ondansetron [facility]) tablet 4 mg  4 mg Oral Q8H PRN Clayton Bibles, PA-C      . venlafaxine XR (EFFEXOR-XR) 24 hr capsule 75 mg  75 mg Oral Q breakfast Emily West, PA-C   75 mg at 10/17/16 4128   Current Outpatient Prescriptions  Medication Sig Dispense Refill  . Aspirin-Salicylamide-Caffeine (BC HEADACHE POWDER PO) Take 1 packet by mouth daily as needed (headache).    . chlordiazePOXIDE (LIBRIUM) 25 MG capsule Take 25 mg by mouth 3 (three) times daily.     Marland Kitchen desvenlafaxine (PRISTIQ) 50 MG 24 hr tablet Take 50 mg by mouth daily.  6  . desvenlafaxine (PRISTIQ) 50 MG 24 hr tablet Take 50 mg by mouth daily.    Marland Kitchen lamoTRIgine (LAMICTAL) 100 MG tablet Take 100 mg by mouth every morning.     . mirtazapine (REMERON) 15 MG tablet Take 15 mg by mouth at bedtime.    . methocarbamol (ROBAXIN) 750 MG tablet Take 1 tablet (750 mg total) by mouth 2 (two) times daily as needed for muscle spasms. (Patient not taking: Reported on 10/16/2016) 60 tablet 0  . OLANZapine-FLUoxetine (SYMBYAX) 3-25 MG capsule Take 1 capsule by mouth every evening.    . ondansetron (ZOFRAN) 4 MG tablet Take 1-2 tablets (4-8 mg total) by mouth every 8 (eight) hours as needed for nausea or vomiting. (Patient not taking: Reported on 10/16/2016) 40 tablet 0  . oxyCODONE-acetaminophen (PERCOCET) 5-325 MG tablet Take 1-2 tablets by mouth every 4 (four) hours as needed for severe pain. (Patient not taking: Reported on 10/16/2016) 90 tablet 0  . senna-docusate (SENOKOT S) 8.6-50 MG tablet Take 1 tablet by mouth at bedtime as needed. (Patient not taking: Reported on 10/16/2016) 30 tablet 1    Musculoskeletal: Strength &  Muscle Tone: within normal limits Gait & Station: normal Patient leans: N/A  Psychiatric Specialty Exam: Physical Exam  Constitutional: She is oriented to person, place, and time. She appears well-developed and well-nourished.  HENT:  Head: Normocephalic.  Neck: Normal range of motion.  Respiratory: Effort normal.  Musculoskeletal: Normal range of motion.  Neurological: She is alert and oriented to person, place, and time.  Skin: Skin is warm and dry.  Psychiatric: Her speech is normal and behavior is normal. Judgment normal. Cognition and memory are normal. She exhibits a depressed mood. She expresses suicidal ideation. She expresses suicidal plans.    Review of Systems  Constitutional: Negative.   HENT: Negative.   Eyes: Negative.   Respiratory: Negative.   Cardiovascular: Negative.   Gastrointestinal: Negative.   Genitourinary: Negative.   Musculoskeletal: Negative.   Skin: Negative.   Neurological: Negative.   Endo/Heme/Allergies: Negative.   Psychiatric/Behavioral: Positive for depression, substance abuse and suicidal ideas.    Blood pressure 140/88, pulse 81, temperature 98 F (36.7 C), temperature source Oral, resp. rate 17, height '5\' 5"'$  (1.651 m), weight 66.2 kg (146 lb), last menstrual period 10/12/2016, SpO2 96 %.Body mass index is 24.3 kg/m.  General Appearance: Disheveled  Eye Contact:  Fair  Speech:  Normal Rate  Volume:  Normal  Mood:  Depressed  Affect:  Congruent  Thought Process:  Coherent and Descriptions of Associations: Intact  Orientation:  Full (Time, Place, and Person)  Thought Content:  Rumination  Suicidal Thoughts:  Yes.  with intent/plan  Homicidal Thoughts:  No  Memory:  Immediate;   Fair Recent;   Fair Remote;   Fair  Judgement:  Poor  Insight:  Fair  Psychomotor Activity:  Decreased  Concentration:  Concentration: Fair and Attention Span: Fair  Recall:  [employer] of Knowledge:  Fair  Language:  Good  Akathisia:  No  Handed:  Right   AIMS (if indicated):     Assets:  Housing Intimacy Leisure Time Physical Health Resilience Social Support  ADL's:  Intact  Cognition:  WNL  Sleep:        Treatment Plan Summary: Daily contact with patient to assess and evaluate symptoms and progress in treatment, Medication management and Plan major depressive disorder, recurrent, severe without psychosis:  -Crisis stabilization -Medication management:  Continue medical medications along with Symbyax 3/25 mg daily for mood stabilization, Effexor 75 mg daily for depression, and Remeron 15 mg at bedtime for sleep.  Started Ativan alcohol detox  -Individual and substance abuse counseling  Disposition: Recommend psychiatric Inpatient admission when medically cleared.  Waylan Boga, NP 10/17/2016 10:44 AM  Patient seen face-to-face for psychiatric evaluation, chart reviewed and case discussed with the physician extender and developed treatment plan. Reviewed the information documented and agree with the treatment plan. Corena Pilgrim, MD

## 2016-10-17 NOTE — Progress Notes (Signed)
Patient ID: Gina Daniels, female   DOB: Nov 25, 1981, 35 y.o.   MRN: [id number]  Urine specimen cup provided to patient. Awaiting it's return with specimen from patient.
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
## 2016-10-17 NOTE — Progress Notes (Signed)
Admission note:  Patient is a 35 yo female [facility] ideation and depression.  Patient has been overwhelmed with financial difficulties (spouse filing for bankruptcy), a couple of her friends have died over the past couple of years.  She has children, however, she states they do not live with her.  [place] lives [place] in [place] and he is supportive.  Patient states she has been drinking 8 beers a night for the past "few months."  She has increased hopelessness, isolation, decreased appetite and she sleeps 10-15 per day.  Patient smokes 1 gram of THC per week.  She sees Dr. Evelene CroonKaur outpatient, however, states she does not have the funds to go anymore.  She has never been detoxed.  She has been hospitalized as a teenager at [facility].  Patient's UDS was positive for benzos and THC.  She is an everyday smoker and requested a patch.  She has brace on left wrist from recent surgery (bone fracture).  She also has a significant scar on her right foot due to surgery.  She states she was diagnosed with anorexia in 2014 and had to see a nutritionist, however, no longer sees them.  She is currently 5'5" and weighs 150 lbs.  She states she has been as low as 97 lbs in 2014.  Patient presents with flat, blunted affect.  She is anxious; no outward withdrawal symptoms present.  Her belongings were secured in locker #14 (purse and stuffed animal).  Patient was oriented to unit and escorted to the cafeteria for lunch.

## 2016-10-17 NOTE — Progress Notes (Signed)
Patient ID: Tonna BoehringerJenny B Godby, female   DOB: 1981/09/12, 35 y.o.   MRN: [id number]  Writer [facility] elevated BP and that patient reported a history of HTN. Patient reports taking HCTZ in the past.

## 2016-10-17 NOTE — BH Assessment (Signed)
[facility] Assessment Progress Note  Per Thedore MinsMojeed Akintayo, MD, this pt requires psychiatric hospitalization at this time.  Gina Heinrichina Tate, RN, [facility] Rm [facility].  Pt has signed Voluntary Admission and Consent for Treatment, as well as Consent to Release Information to her husband, and to Milagros Evenerupinder [name], MD her outpatient provider, and a notification call has been placed to the latter.  [facility].  Pt's nurse, Gina Hymendie, has been notified, and agrees to send original paperwork along with pt via [facility], and to call report to [phone].  Gina Canninghomas Mishelle Hassan, MA Triage Specialist [phone]

## 2016-10-17 NOTE — ED Notes (Signed)
Pt discharged ambulatory with [place] driver.  All belongings were sent with patient.  Pt was calm and cooperative. 

## 2016-10-18 DIAGNOSIS — F332 Major depressive disorder, recurrent severe without psychotic features: Principal | ICD-10-CM

## 2016-10-18 LAB — TSH: TSH: 6.422 u[IU]/mL — ABNORMAL HIGH (ref 0.350–4.500)

## 2016-10-18 LAB — PREGNANCY, URINE: Preg Test, Ur: NEGATIVE

## 2016-10-18 LAB — VITAMIN B12: Vitamin B-12: 371 pg/mL (ref 180–914)

## 2016-10-18 MED ORDER — DILTIAZEM HCL ER COATED BEADS 120 MG PO CP24: 120.0000 mg | ORAL_CAPSULE | Freq: Every day | ORAL | Status: DC

## 2016-10-18 MED ORDER — PRAZOSIN HCL 2 MG PO CAPS
4.0000 mg | ORAL_CAPSULE | Freq: Every day | ORAL | Status: DC
  Administered 2016-10-18 – 2016-10-20 (×3): 4 mg via ORAL
  Filled 2016-10-18: qty 2
  Filled 2016-10-18: qty 4
  Filled 2016-10-18 (×5): qty 2

## 2016-10-18 MED ORDER — NALTREXONE HCL 50 MG PO TABS
25.0000 mg | ORAL_TABLET | Freq: Every day | ORAL | Status: DC
  Administered 2016-10-18 – 2016-10-19 (×2): 25 mg via ORAL
  Administered 2016-10-20: 08:00:00 via ORAL
  Administered 2016-10-21 – 2016-10-22 (×2): 25 mg via ORAL
  Filled 2016-10-18 (×8): qty 1

## 2016-10-18 NOTE — [facility] Group Notes (Signed)
[facility] Group Notes:  (Nursing/MHT/Case Management/Adjunct)  Date:  10/18/2016  Time:  1:44 PM  Type of Therapy:  Nurse Education  Participation Level:  Active  Participation Quality:  Appropriate and Attentive  Affect:  Appropriate  Cognitive:  Alert and Appropriate  Insight:  Appropriate  Engagement in Group:  Engaged  Modes of Intervention:  Discussion and Problem-solving  Summary of Progress/Problems: Patient attended nurse education group. She was able to report her goal for today as, "Wanting to not hurt myself".  She was appropriate.   Almira Barenny G Ashauna Bertholf 10/18/2016, 1:44 PM
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
## 2016-10-18 NOTE — Progress Notes (Signed)
[facility]  10/18/2016 11:58 AM  Patient Active Problem List   Diagnosis Date Noted  . Alcohol abuse 10/17/2016  . 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 MDD (major depressive disorder), recurrent episode, severe (HCC) 10/17/2016  . Postoperative wound dehiscence 10/05/2014    Diagnosis: Alcohol use disorder, depression  Subjective: Patient reports she is doing fairly well although she did not sleep well last night. She denies any current suicidal or homicidal ideation, plan or intent. She says she is enjoying the food in the cafeteria and she had Rice Krispies with chocolate milk for breakfast. We discussed that her blood pressure has been somewhat elevated and we will increase her Minipress to 4 mg by mouth daily at bedtime which may help with sleep, nightmares, and blood pressure. She did receive a when necessary of Remeron late last night and I will discontinue this for now.  Objective: Developed well nourished female in no apparent distress who is pleasant and cooperative and appears slightly tired speech and motor within normal limits mood is described as all right affect is a bit fatigued appearing thought processes linear and goal-directed thought content denies any current suicidal or homicidal ideation, plan or intent alert and oriented 3 IQ appears in average range insight and judgment are fair     Current Facility-Administered Medications (Cardiovascular):  .  prazosin (MINIPRESS) capsule 4 mg     Current Facility-Administered Medications (Analgesics):  .  acetaminophen (TYLENOL) tablet 650 mg .  ibuprofen (ADVIL,MOTRIN) tablet 600 mg     Current Facility-Administered Medications (Other):  .  alum & mag hydroxide-simeth (MAALOX/MYLANTA) 200-200-20 MG/5ML suspension 30 mL .  hydrOXYzine (ATARAX/VISTARIL) tablet 25 mg .  lamoTRIgine (LAMICTAL) tablet 100 mg .  loperamide (IMODIUM) capsule 2-4 mg .   [COMPLETED] LORazepam (ATIVAN) tablet 1 mg **FOLLOWED BY** LORazepam (ATIVAN) tablet 1 mg **FOLLOWED BY** [START ON 10/19/2016] LORazepam (ATIVAN) tablet 1 mg **FOLLOWED BY** [START ON 10/20/2016] LORazepam (ATIVAN) tablet 1 mg .  magnesium hydroxide (MILK OF MAGNESIA) suspension 30 mL .  multivitamin with minerals tablet 1 tablet .  naltrexone (DEPADE) tablet 25 mg .  nicotine (NICODERM CQ - dosed in mg/24 hours) patch 21 mg .  ondansetron (ZOFRAN-ODT) disintegrating tablet 4 mg .  thiamine (VITAMIN B-1) tablet 100 mg .  venlafaxine XR (EFFEXOR-XR) 24 hr capsule 150 mg  No current outpatient prescriptions on file.  Vital Signs:Blood pressure (!) 144/96, pulse (!) 101, temperature 98.4 F (36.9 C), temperature source Oral, resp. rate 20, height 5\' 5"  (1.651 m), weight 68 kg (150 lb), last menstrual period 10/12/2016, SpO2 99 %.    Lab Results:  Results for orders placed or performed during the hospital encounter of 10/17/16 (from the past 48 hour(s))  Pregnancy, urine     Status: None   Collection Time: 10/17/16 11:53 PM  Result Value Ref Range   Preg Test, Ur NEGATIVE NEGATIVE    Comment:        THE SENSITIVITY OF THIS METHODOLOGY IS >20 mIU/mL. Performed at [facility]   TSH     Status: Abnormal   Collection Time: 10/18/16  6:27 AM  Result Value Ref Range   TSH 6.422 (H) 0.350 - 4.500 uIU/mL    Comment: Performed by a 3rd Generation assay with a functional sensitivity of <=0.01 uIU/mL. Performed at [facility]   Vitamin B12     Status: None   Collection Time: 10/18/16  6:27 AM  Result Value Ref Range   Vitamin B-12 371 180 - 914 pg/mL    Comment: (NOTE) This assay is not validated for testing neonatal or myeloproliferative syndrome specimens for Vitamin B12 levels. Performed at [facility]     Physical Findings: AIMS: Facial and Oral Movements Muscles of Facial Expression: None, normal Lips and Perioral Area: None,  normal Jaw: None, normal Tongue: None, normal,Extremity Movements Upper (arms, wrists, hands, fingers): None, normal Lower (legs, knees, ankles, toes): None, normal, Trunk Movements Neck, shoulders, hips: None, normal, Overall Severity Severity of abnormal movements (highest score from questions above): None, normal Incapacitation due to abnormal movements: None, normal Patient's awareness of abnormal movements (rate only patient's report): No Awareness, Dental Status Current problems with teeth and/or dentures?: Yes (several caries) Does patient usually wear dentures?: No  CIWA:  CIWA-Ar Total: 3 COWS:      Assessment/Plan: Patient appears to be detoxing without incident. She is tolerating medication changes fairly well so far. She denies any medication side effects. She does have higher blood pressure and we will increase the Minipress as mentioned above.  Acquanetta Sit, MD 10/18/2016, 11:58 AM

## 2016-10-18 NOTE — Progress Notes (Signed)
Patient ID: Gina Daniels, female   DOB: 14-Jul-1981, 35 y.o.   MRN: [id number]  Subjective: Patient stated her anxiety is from everything. She said, "my husband is great." She says she is feeling better today. She rated her day 5 out of 10.  She said she is not suicidal. Triggers for her is being isolated at home. Gina LucksJenny stated she copes with stress by writing and photography.   Objective: Patient is interacting and talking with other patients this morning. She was playing "spades" a card game.  She appears to be upbeat and enjoying her day.   Assessment: Detox from alcohol  Plan: To discharge back home per doctors discharge date.   Alma DownsGina Cheyla Duchemin Nursing Student 10/18/2016 0919 am

## 2016-10-18 NOTE — [facility] Group Notes (Signed)
[facility] LCSW Group Therapy  10/18/2016 4:15 PM  Type of Therapy:  Group Therapy  Participation Level:  Active  Participation Quality:  Attentive  Affect:  Appropriate  Cognitive:  Alert and Oriented  Insight:  Improving  Engagement in Therapy:  Engaged  Modes of Intervention:  Discussion, Education, Exploration, Problem-solving, Rapport Building, Socialization and Support  Summary of Progress/Problems: Emotion Regulation: This group focused on both positive and negative emotion identification and allowed group members to process ways to identify feelings, regulate negative emotions, and find healthy ways to manage internal/external emotions. Group members were asked to reflect on a time when their reaction to an emotion led to a negative outcome and explored how alternative responses using emotion regulation would have benefited them. Group members were also asked to discuss a time when emotion regulation was utilized when a negative emotion was experienced.   Gina PeoplesHeather N Daniels 10/18/2016, 4:15 PM

## 2016-10-18 NOTE — Plan of Care (Signed)
Problem: Activity: Goal: Interest or engagement in leisure activities will improve Outcome: Progressing Patient has been attending groups, both educational and recreational. She has also been spending time in the dayroom interacting with peers.

## 2016-10-18 NOTE — Tx Team (Signed)
Interdisciplinary Treatment and Diagnostic Plan Update  10/18/2016 Time of Session: 9:30AM Gina Daniels MRN: [id number]  Principal Diagnosis: Alcohol abuse  Secondary Diagnoses: Principal Problem:   Alcohol abuse Active Problems:   Major depressive disorder, recurrent severe without psychotic features (HCC)   MDD (major depressive disorder), recurrent episode, severe (HCC)   Current Medications:  Current Facility-Administered Medications  Medication Dose Route Frequency Provider Last Rate Last Dose  . acetaminophen (TYLENOL) tablet 650 mg  650 mg Oral Q6H PRN Kerry Hough, PA-C      . alum & mag hydroxide-simeth (MAALOX/MYLANTA) 200-200-20 MG/5ML suspension 30 mL  30 mL Oral Q4H PRN Kerry Hough, PA-C      . hydrOXYzine (ATARAX/VISTARIL) tablet 25 mg  25 mg Oral Q6H PRN Craige Cotta, MD      . ibuprofen (ADVIL,MOTRIN) tablet 600 mg  600 mg Oral Q8H PRN Charm Rings, NP   600 mg at 10/18/16 0807  . lamoTRIgine (LAMICTAL) tablet 100 mg  100 mg Oral BID Acquanetta Sit, MD   100 mg at 10/18/16 0805  . loperamide (IMODIUM) capsule 2-4 mg  2-4 mg Oral PRN Craige Cotta, MD   4 mg at 10/17/16 1303  . LORazepam (ATIVAN) tablet 1 mg  1 mg Oral TID Craige Cotta, MD   1 mg at 10/18/16 0805   Followed by  . [START ON 10/19/2016] LORazepam (ATIVAN) tablet 1 mg  1 mg Oral BID Craige Cotta, MD       Followed by  . [START ON 10/20/2016] LORazepam (ATIVAN) tablet 1 mg  1 mg Oral Daily Fernando A Cobos, MD      . magnesium hydroxide (MILK OF MAGNESIA) suspension 30 mL  30 mL Oral Daily PRN Kerry Hough, PA-C      . multivitamin with minerals tablet 1 tablet  1 tablet Oral Daily Craige Cotta, MD   1 tablet at 10/18/16 0806  . naltrexone (DEPADE) tablet 25 mg  25 mg Oral Daily Acquanetta Sit, MD   25 mg at 10/18/16 1014  . nicotine (NICODERM CQ - dosed in mg/24 hours) patch 21 mg  21 mg Transdermal Daily Charm Rings, NP   21 mg at 10/18/16 0804  .  ondansetron (ZOFRAN-ODT) disintegrating tablet 4 mg  4 mg Oral Q6H PRN Rockey Situ Cobos, MD      . prazosin (MINIPRESS) capsule 4 mg  4 mg Oral QHS Acquanetta Sit, MD      . thiamine (VITAMIN B-1) tablet 100 mg  100 mg Oral Daily Craige Cotta, MD   100 mg at 10/18/16 0805  . venlafaxine XR (EFFEXOR-XR) 24 hr capsule 150 mg  150 mg Oral Q breakfast Acquanetta Sit, MD   150 mg at 10/18/16 0805   PTA Medications: Prescriptions Prior to Admission  Medication Sig Dispense Refill Last Dose  . Aspirin-Salicylamide-Caffeine (BC HEADACHE POWDER PO) Take 1 packet by mouth daily as needed (headache).   10/16/2016 at Unknown time  . chlordiazePOXIDE (LIBRIUM) 25 MG capsule Take 25 mg by mouth 3 (three) times daily.    10/16/2016 at Unknown time  . desvenlafaxine (PRISTIQ) 50 MG 24 hr tablet Take 50 mg by mouth daily.  6 10/16/2016 at Unknown time  . desvenlafaxine (PRISTIQ) 50 MG 24 hr tablet Take 50 mg by mouth daily.   10/16/2016 at Unknown time  . lamoTRIgine (LAMICTAL) 100 MG tablet Take 100 mg by mouth every morning.  10/16/2016 at 1130  . methocarbamol (ROBAXIN) 750 MG tablet Take 1 tablet (750 mg total) by mouth 2 (two) times daily as needed for muscle spasms. (Patient not taking: Reported on 10/16/2016) 60 tablet 0 Completed Course at Unknown time  . mirtazapine (REMERON) 15 MG tablet Take 15 mg by mouth at bedtime.   10/14/2016 at unknown time  . OLANZapine-FLUoxetine (SYMBYAX) 3-25 MG capsule Take 1 capsule by mouth every evening.   10/14/2016 at unknown time  . ondansetron (ZOFRAN) 4 MG tablet Take 1-2 tablets (4-8 mg total) by mouth every 8 (eight) hours as needed for nausea or vomiting. (Patient not taking: Reported on 10/16/2016) 40 tablet 0 Completed Course at Unknown time  . senna-docusate (SENOKOT S) 8.6-50 MG tablet Take 1 tablet by mouth at bedtime as needed. (Patient not taking: Reported on 10/16/2016) 30 tablet 1 Completed Course at Unknown time    Patient Stressors:  Financial difficulties Health problems Substance abuse  Patient Strengths: Average or above average intelligence Capable of independent living Communication skills Supportive family/friends  Treatment Modalities: Medication Management, Group therapy, Case management,  1 to 1 session with clinician, Psychoeducation, Recreational therapy.   Physician Treatment Plan for Primary Diagnosis: Alcohol abuse Long Term Goal(s): Improvement in symptoms so as ready for discharge Improvement in symptoms so as ready for discharge   Short Term Goals: Ability to maintain clinical measurements within normal limits will improve Ability to identify changes in lifestyle to reduce recurrence of condition will improve Ability to identify and develop effective coping behaviors will improve Ability to identify triggers associated with substance abuse/mental health issues will improve  Medication Management: Evaluate patient's response, side effects, and tolerance of medication regimen.  Therapeutic Interventions: 1 to 1 sessions, Unit Group sessions and Medication administration.  Evaluation of Outcomes: Progressing  Physician Treatment Plan for Secondary Diagnosis: Principal Problem:   Alcohol abuse Active Problems:   Major depressive disorder, recurrent severe without psychotic features (HCC)   MDD (major depressive disorder), recurrent episode, severe (HCC)  Long Term Goal(s): Improvement in symptoms so as ready for discharge Improvement in symptoms so as ready for discharge   Short Term Goals: Ability to maintain clinical measurements within normal limits will improve Ability to identify changes in lifestyle to reduce recurrence of condition will improve Ability to identify and develop effective coping behaviors will improve Ability to identify triggers associated with substance abuse/mental health issues will improve     Medication Management: Evaluate patient's response, side effects, and  tolerance of medication regimen.  Therapeutic Interventions: 1 to 1 sessions, Unit Group sessions and Medication administration.  Evaluation of Outcomes: Progressing   RN Treatment Plan for Primary Diagnosis: Alcohol abuse Long Term Goal(s): Knowledge of disease and therapeutic regimen to maintain health will improve  Short Term Goals: Ability to remain free from injury will improve, Ability to disclose and discuss suicidal ideas and Ability to identify and develop effective coping behaviors will improve  Medication Management: RN will administer medications as ordered by provider, will assess and evaluate patient's response and provide education to patient for prescribed medication. RN will report any adverse and/or side effects to prescribing provider.  Therapeutic Interventions: 1 on 1 counseling sessions, Psychoeducation, Medication administration, Evaluate responses to treatment, Monitor vital signs and CBGs as ordered, Perform/monitor CIWA, COWS, AIMS and Fall Risk screenings as ordered, Perform wound care treatments as ordered.  Evaluation of Outcomes: Progressing   LCSW Treatment Plan for Primary Diagnosis: Alcohol abuse Long Term Goal(s): Safe transition to appropriate next  level of care at discharge, Engage patient in therapeutic group addressing interpersonal concerns.  Short Term Goals: Engage patient in aftercare planning with referrals and resources, Facilitate patient progression through stages of change regarding substance use diagnoses and concerns and Identify triggers associated with mental health/substance abuse issues  Therapeutic Interventions: Assess for all discharge needs, 1 to 1 time with Social worker, Explore available resources and support systems, Assess for adequacy in community support network, Educate family and significant other(s) on suicide prevention, Complete Psychosocial Assessment, Interpersonal group therapy.  Evaluation of Outcomes:  Progressing   Progress in Treatment: Attending groups: No. New to unit. Continuing to assess.  Participating in groups: No. Taking medication as prescribed: Yes. Toleration medication: Yes. Family/Significant other contact made: No, will contact:  family member/spouse if pt consents. Patient understands diagnosis: Yes. Discussing patient identified problems/goals with staff: Yes. Medical problems stabilized or resolved: Yes. Denies suicidal/homicidal ideation: Yes. Issues/concerns per patient self-inventory: No. Other: n/a  New problem(s) identified: No, Describe:  n/a  New Short Term/Long Term Goal(s): medication stabilization, detox, and follow-up with outpatient provider(s).   Discharge Plan or Barriers: CSW Assessing for appropriate referrals at this time.   Reason for Continuation of Hospitalization: Depression Medication stabilization Withdrawal symptoms  Estimated Length of Stay: 2-3 days   Attendees: Patient: 10/18/2016 11:16 AM  Physician: Dr. Mckinley Jewelates MD 10/18/2016 11:16 AM  Nursing: Buelah Manislivette, Penny RN 10/18/2016 11:16 AM  RN Care Manager: Onnie BoerJennifer Clark CM 10/18/2016 11:16 AM  Social Worker: Trula SladeHeather Smart, LCSW 10/18/2016 11:16 AM  Recreational Therapist:  10/18/2016 11:16 AM  Other:  10/18/2016 11:16 AM  Other:  10/18/2016 11:16 AM  Other: 10/18/2016 11:16 AM    Scribe for Treatment Team: Ledell PeoplesHeather N Smart, LCSW 10/18/2016 11:16 AM

## 2016-10-18 NOTE — Progress Notes (Signed)
Data. Patient denies SI/HI/AVH. Patient interacting well with staff and other patients. Affect is bright on approach. She has been overheard making inappropriate comments, throughout shift. She stated to the nurse, "Drug me up". And to peer, "Just take drugs, it will make you feel better". Patient has been able to be redirected, however. On here self assessment she reports 3/10 for depression and hopelessness and 4/10 for anxiety. Her goal for today is, "Dealing with withdrawals". Action. Emotional support and encouragement offered. Education provided on medication, indications and side effect. Q 15 minute checks done for safety. Response. Safety on the unit maintained through 15 minute checks.  Medications taken as prescribed. Attended groups. Remained calm and appropriate through out shift.

## 2016-10-19 MED ORDER — HYDROXYZINE HCL 50 MG PO TABS
50.0000 mg | ORAL_TABLET | Freq: Every evening | ORAL | Status: DC | PRN
  Administered 2016-10-19: 50 mg via ORAL

## 2016-10-19 NOTE — [facility] Group Notes (Signed)
[facility] LCSW Group Therapy  10/19/2016 3:10 PM  Type of Therapy:  Group Therapy  Participation Level:  Active  Participation Quality:  Attentive  Affect:  Appropriate  Cognitive:  Alert and Oriented  Insight:  Improving  Engagement in Therapy:  Engaged  Modes of Intervention:  Discussion, Education, Exploration, Problem-solving, Rapport Building, Socialization and Support  Summary of Progress/Problems: Feelings around Relapse. Group members discussed the meaning of relapse and shared personal stories of relapse, how it affected them and others, and how they perceived themselves during this time. Group members were encouraged to identify triggers, warning signs and coping skills used when facing the possibility of relapse. Social supports were discussed and explored in detail. [name] was attentive and engaged during today's processing group. She shared that she is nervous about her upcoming discharge on Sunday due to relapse risk. "I feel good here. I'm nervous about going back home." [name] talked about these fears and identified her husband as a positive support. She continues to progress in the group setting with improving insight.   Allisen Pidgeon N Smart LCSW 10/19/2016, 3:10 PM

## 2016-10-19 NOTE — Progress Notes (Signed)
Patient ID: Gina BoehringerJenny B Daniels, female   DOB: [birth year], 35 y.o.   MRN: [id number]   Pt currently presents with a blunted affect and irritable behavior. Pt states "I'm sad that my husband is going to a festival and he won't be coming to see me." Pt seen sitting in the hallway talking quietly with a female pt, he had to be redirected from her door entryway on another occasion. Pt reports good sleep with current medication regimen. Pt reports body generalized pain and headache due to withdrawal.   Pt provided with medications per providers orders. Pt's labs and vitals were monitored throughout the night. Pt supported emotionally and encouraged to express concerns and questions. Pt educated on medications.  Pt's safety ensured with 15 minute and environmental checks. Pt currently denies SI/HI and A/V hallucinations. Pt verbally agrees to seek staff if SI/HI or A/VH occurs and to consult with staff before acting on any harmful thoughts. Will continue POC.

## 2016-10-19 NOTE — Progress Notes (Signed)
Recreation Therapy Notes  Date: 10/19/16 Time: 0930 Location: 300 Hall Dayroom  Group Topic: Stress Management  Goal Area(s) Addresses:  Patient will verbalize importance of using healthy stress management.  Patient will identify positive emotions associated with healthy stress management.   Behavioral Response: Engaged  Intervention: Stress Management  Activity :  [facility].  LRT introduced the stress management technique of guided imagery.  LRT read a script to engage patients in the technique.  Patients were to follow along as LRT read the script.  Education:  Stress Management, Discharge Planning.   Education Outcome: Acknowledges edcuation/In group clarification offered/Needs additional education  Clinical Observations/Feedback: Pt attended group.    Caroll RancherMarjette Rahiem Schellinger, LRT/CTRS         Caroll RancherLindsay, Ranay Ketter A 10/19/2016 11:26 AM

## 2016-10-19 NOTE — Progress Notes (Signed)
[facility] MD Progress Note  10/19/2016 12:09 PM  Patient Active Problem List   Diagnosis Date Noted  . Alcohol abuse 10/17/2016  . 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 MDD (major depressive disorder), recurrent episode, severe (HCC) 10/17/2016  . Postoperative wound dehiscence 10/05/2014    Diagnosis: Alcohol use disorder, depression, PTSD, anorexia nervosa  Subjective: Patient reports she has having anxiety over the fact that one of her friends here was taken to the emergency room for evaluation yesterday night. She states that she had difficulty remaining asleep. She is also a bit disappointed that her husband has decided to attend a barbecue Festival without her Saturday night. She does deny any acute suicidal or homicidal plan or intent. We discussed that her TSH was mildly elevated and we will recheck a TSH and a T4 tomorrow to evaluate her thyroid function and she was educated on what the thyroid does and what the effects of low thyroid might be.  Objective: Well-developed well-nourished woman in no apparent distress pleasant and appropriate mood is described as being a bit anxious and down and her affect is congruent, speech and motor within normal limits thought processes linear and goal-directed thought content denies any current acute suicidal or homicidal plan or intent, no current psychotic symptoms or complicated withdrawal symptoms, alert and oriented 3 IQ appears an average range insight and judgment are fair  Nursing reports that the patient has been at times a bit inappropriate in conversation and interaction with peers, flirting or expressing that people should do drugs.     Current Facility-Administered Medications (Cardiovascular):  .  prazosin (MINIPRESS) capsule 4 mg     Current Facility-Administered Medications (Analgesics):  .  acetaminophen (TYLENOL) tablet 650 mg .  ibuprofen (ADVIL,MOTRIN) tablet 600  mg     Current Facility-Administered Medications (Other):  .  alum & mag hydroxide-simeth (MAALOX/MYLANTA) 200-200-20 MG/5ML suspension 30 mL .  hydrOXYzine (ATARAX/VISTARIL) tablet 25 mg .  lamoTRIgine (LAMICTAL) tablet 100 mg .  loperamide (IMODIUM) capsule 2-4 mg .  [COMPLETED] LORazepam (ATIVAN) tablet 1 mg **FOLLOWED BY** [COMPLETED] LORazepam (ATIVAN) tablet 1 mg **FOLLOWED BY** LORazepam (ATIVAN) tablet 1 mg **FOLLOWED BY** [START ON 10/20/2016] LORazepam (ATIVAN) tablet 1 mg .  magnesium hydroxide (MILK OF MAGNESIA) suspension 30 mL .  multivitamin with minerals tablet 1 tablet .  naltrexone (DEPADE) tablet 25 mg .  nicotine (NICODERM CQ - dosed in mg/24 hours) patch 21 mg .  ondansetron (ZOFRAN-ODT) disintegrating tablet 4 mg .  thiamine (VITAMIN B-1) tablet 100 mg .  venlafaxine XR (EFFEXOR-XR) 24 hr capsule 150 mg  No current outpatient prescriptions on file.  Vital Signs:Blood pressure 97/86, pulse (!) 120, temperature 98.1 F (36.7 C), temperature source Oral, resp. rate 18, height 5\' 5"  (1.651 m), weight 68 kg (150 lb), last menstrual period 10/12/2016, SpO2 99 %.    Lab Results:  Results for orders placed or performed during the hospital encounter of 10/17/16 (from the past 48 hour(s))  Pregnancy, urine     Status: None   Collection Time: 10/17/16 11:53 PM  Result Value Ref Range   Preg Test, Ur NEGATIVE NEGATIVE    Comment:        THE SENSITIVITY OF THIS METHODOLOGY IS >20 mIU/mL. Performed at [facility]   TSH     Status: Abnormal   Collection Time: 10/18/16  6:27 AM  Result Value Ref Range   TSH 6.422 (H) 0.350 - 4.500 uIU/mL  Comment: Performed by a 3rd Generation assay with a functional sensitivity of <=0.01 uIU/mL. Performed at [facility]   Vitamin B12     Status: None   Collection Time: 10/18/16  6:27 AM  Result Value Ref Range   Vitamin B-12 371 180 - 914 pg/mL    Comment: (NOTE) This assay is not  validated for testing neonatal or myeloproliferative syndrome specimens for Vitamin B12 levels. Performed at [facility]     Physical Findings: AIMS: Facial and Oral Movements Muscles of Facial Expression: None, normal Lips and Perioral Area: None, normal Jaw: None, normal Tongue: None, normal,Extremity Movements Upper (arms, wrists, hands, fingers): None, normal Lower (legs, knees, ankles, toes): None, normal, Trunk Movements Neck, shoulders, hips: None, normal, Overall Severity Severity of abnormal movements (highest score from questions above): None, normal Incapacitation due to abnormal movements: None, normal Patient's awareness of abnormal movements (rate only patient's report): No Awareness, Dental Status Current problems with teeth and/or dentures?: Yes (poor dental health) Does patient usually wear dentures?: No  CIWA:  CIWA-Ar Total: 3 COWS:      Assessment/Plan: We'll add Vistaril when necessary at at bedtime if she wishes to try it for sleep. No problems so far with the naltrexone and we'll continue. Patient appears to be tolerating current medication regimen well. Last blood pressure does appear to be lower and pulse higher so will not increase Minipress but continue to monitor. TSH mildly elevated and labs ordered as above. Currently patient plans to discharge on Sunday and we will discharge her Sunday if present course continues.  Gina SitElizabeth Woods Oates, MD 10/19/2016, 12:09 PM

## 2016-10-19 NOTE — Progress Notes (Signed)
D    Pt was observed on the phone having a conversation   She was talking very loud and was argumentative   She afterward came to the medication window requesting something for anxiety she interacts minimally with others   She is very irritable and anxious A    Verbal support given   Medications administered and effectiveness monitored    Q 15 min checks   Encouraged pt to think about who she talks to and how upset she gets and possible decreasing her conversations with those people  R   Pt is presently safe and receptive to encouragement

## 2016-10-19 NOTE — Progress Notes (Signed)
  [facility]charge [facility] :  Will you be returning to the same living situation after discharge:  Yes,  home At discharge, do you have transportation home?: Yes,  [facility]. Pt is scheduled for sunday discharge per Dr. Mckinley Jewelates. Do you have the ability to pay for your medications: Yes,  [facility]  Release of information consent forms completed and submitted to medical records by CSW.  Patient to Follow up at: Follow-up Information    Glori BickersKAUR,[name], MD Follow up on 10/24/2016.   Specialty:  Psychiatry Why:  Appt on this date at 6:30PM with Dr. Evelene CroonKaur for hospital follow-up/medication management. Please call within 48 hours of appt to cancel or reschedule if necessary. Thank you.   Contact information: [address] [address] [place] [address] [phone]        [facility] Follow up on 01/09/2017.   Why:  Appt on this date at 10:30AM for medication management with Dr. Lolly MustacheArfeen. Thank you.   Contact information: [address]  [place], [place] [address] Phone: [phone] Fax: [phone]       [facility] Follow up on 11/26/2016.   Why:  Appt on this date at 10:00AM for counseling with Tomma LightningFrankie. Please bring your insurance card to this appt and arrive by 10am to complete new patient paperwork. Thank you!  Contact information: [address]  [place], [place] [address] Phone: [phone] Fax: [phone]          Next level of care provider [facility] Link:no  Safety Planning and Suicide Prevention discussed: Yes,  SPE completed with pt's husband. Pt provided with SPI pamphlet and Mobile Crisis information.  Have you used any form of tobacco in the last 30 days? (Cigarettes, Smokeless Tobacco, Cigars, and/or Pipes): Yes  Has patient been referred to the Quitline?: Patient refused referral  Patient has been  referred for addiction treatment: Yes  Jeoffrey Eleazer N Smart LCSW 10/19/2016, 11:48 AM

## 2016-10-19 NOTE — Progress Notes (Signed)
Data. Patient denies SI/HI/AVH. Patient continues to exhibit inappropriate conversation and behavior, with both staff and patients. When another patient was being taken off the unit via EMS, patient stood in the hall way, impleading the stretcher, and needed to be told multiple time to move and go into the dayroom. She attempted to allow another patient to, "borrow" her hair conditioner. When asking for her air conditioner she stated, "I need a drink". When asked for clarrification of this statement, she replied, "That conditioner has alcohol in it".  Patient is redirectable when these interactions happen, but patient appears to have no insight into her inappropriate behavior. On her self assessment patient reports 5/10 for depression, 4/10 for hopelessness and 7/10 for anxiety. Her goal for today is: "Not to be so angry".  Action. Emotional support and encouragement offered. Education provided on medication, indications and side effect. Q 15 minute checks done for safety. Response. Safety on the unit maintained through 15 minute checks.  Medications taken as prescribed. Attended groups.

## 2016-10-19 NOTE — [facility] Suicide Risk Assessment (Addendum)
[facility] INPATIENT:  Family/Significant Other Suicide Prevention Education  Suicide Prevention Education:  Contact Attempts: Raenette Roveratrick Rayfield (pt's husband) [phone] has been identified by the patient as the family member/significant other with whom the patient will be residing, and identified as the person(s) who will aid the patient in the event of a mental health crisis.  With written consent from the patient, two attempts were made to provide suicide prevention education, prior to and/or following the patient's discharge.  We were unsuccessful in providing suicide prevention education.  A suicide education pamphlet was given to the patient to share with family/significant other.  Date and time of first attempt: 10/18/16 at 4:00PM Date and time of second attempt: 10/19/16 at 10:41AM (message left requesting call back at his earliest convenience).   Omarion Minnehan N Smart LCSW 10/19/2016, 10:41 AM   Pt's husband called back. SPE completed. He requested 2 month refill for prescriptions due to inability to go to Dr. Evelene CroonKaur (financial reasons). He is agreeable to Sunday discharge and has no concerns at this time.  Trula SladeHeather Smart, MSW, LCSW Clinical Social Worker 10/19/2016 11:13 AM '

## 2016-10-19 NOTE — Tx Team (Signed)
Interdisciplinary Treatment and Diagnostic Plan Update  Daniels Time of Session: 9:30AM Gina Daniels MRN: [id number]  Principal Diagnosis: Alcohol abuse  Secondary Diagnoses: Principal Problem:   Alcohol abuse Active Problems:   Major depressive disorder, recurrent severe without psychotic features (HCC)   MDD (major depressive disorder), recurrent episode, severe (HCC)   Current Medications:  Current Facility-Administered Medications  Medication Dose Route Frequency Provider Last Rate Last Dose  . acetaminophen (TYLENOL) tablet 650 mg  650 mg Oral Q6H PRN Kerry Hough, PA-C   650 mg at 10/18/16 1956  . alum & mag hydroxide-simeth (MAALOX/MYLANTA) 200-200-20 MG/5ML suspension 30 mL  30 mL Oral Q4H PRN Kerry Hough, PA-C      . hydrOXYzine (ATARAX/VISTARIL) tablet 25 mg  25 mg Oral Q6H PRN Craige Cotta, MD   25 mg at 10/19/16 1107  . ibuprofen (ADVIL,MOTRIN) tablet 600 mg  600 mg Oral Q8H PRN Charm Rings, NP   600 mg at 10/19/16 1107  . lamoTRIgine (LAMICTAL) tablet 100 mg  100 mg Oral BID Acquanetta Sit, MD   100 mg at 10/19/16 0755  . loperamide (IMODIUM) capsule 2-4 mg  2-4 mg Oral PRN Craige Cotta, MD   4 mg at 10/17/16 1303  . LORazepam (ATIVAN) tablet 1 mg  1 mg Oral BID Craige Cotta, MD   1 mg at 10/19/16 0756   Followed by  . [START ON 10/20/2016] LORazepam (ATIVAN) tablet 1 mg  1 mg Oral Daily Fernando A Cobos, MD      . magnesium hydroxide (MILK OF MAGNESIA) suspension 30 mL  30 mL Oral Daily PRN Kerry Hough, PA-C      . multivitamin with minerals tablet 1 tablet  1 tablet Oral Daily Craige Cotta, MD   1 tablet at 10/19/16 0754  . naltrexone (DEPADE) tablet 25 mg  25 mg Oral Daily Acquanetta Sit, MD   25 mg at 10/19/16 0754  . nicotine (NICODERM CQ - dosed in mg/24 hours) patch 21 mg  21 mg Transdermal Daily Charm Rings, NP   21 mg at 10/19/16 0756  . ondansetron (ZOFRAN-ODT) disintegrating tablet 4 mg  4 mg Oral Q6H PRN  Rockey Situ Cobos, MD      . prazosin (MINIPRESS) capsule 4 mg  4 mg Oral QHS Acquanetta Sit, MD   4 mg at 10/18/16 2222  . thiamine (VITAMIN B-1) tablet 100 mg  100 mg Oral Daily Craige Cotta, MD   100 mg at 10/19/16 0754  . venlafaxine XR (EFFEXOR-XR) 24 hr capsule 150 mg  150 mg Oral Q breakfast Acquanetta Sit, MD   150 mg at 10/19/16 0754   PTA Medications: Prescriptions Prior to Admission  Medication Sig Dispense Refill Last Dose  . Aspirin-Salicylamide-Caffeine (BC HEADACHE POWDER PO) Take 1 packet by mouth daily as needed (headache).   10/16/2016 at Unknown time  . chlordiazePOXIDE (LIBRIUM) 25 MG capsule Take 25 mg by mouth 3 (three) times daily.    10/16/2016 at Unknown time  . desvenlafaxine (PRISTIQ) 50 MG 24 hr tablet Take 50 mg by mouth daily.  6 10/16/2016 at Unknown time  . desvenlafaxine (PRISTIQ) 50 MG 24 hr tablet Take 50 mg by mouth daily.   10/16/2016 at Unknown time  . lamoTRIgine (LAMICTAL) 100 MG tablet Take 100 mg by mouth every morning.    10/16/2016 at 1130  . methocarbamol (ROBAXIN) 750 MG tablet Take 1 tablet (750 mg total) by mouth  2 (two) times daily as needed for muscle spasms. (Patient not taking: Reported on 10/16/2016) 60 tablet 0 Completed Course at Unknown time  . mirtazapine (REMERON) 15 MG tablet Take 15 mg by mouth at bedtime.   10/14/2016 at unknown time  . OLANZapine-FLUoxetine (SYMBYAX) 3-25 MG capsule Take 1 capsule by mouth every evening.   10/14/2016 at unknown time  . ondansetron (ZOFRAN) 4 MG tablet Take 1-2 tablets (4-8 mg total) by mouth every 8 (eight) hours as needed for nausea or vomiting. (Patient not taking: Reported on 10/16/2016) 40 tablet 0 Completed Course at Unknown time  . senna-docusate (SENOKOT S) 8.6-50 MG tablet Take 1 tablet by mouth at bedtime as needed. (Patient not taking: Reported on 10/16/2016) 30 tablet 1 Completed Course at Unknown time    Patient Stressors: Financial difficulties Health problems Substance  abuse  Patient Strengths: Average or above average intelligence Capable of independent living Communication skills Supportive family/friends  Treatment Modalities: Medication Management, Group therapy, Case management,  1 to 1 session with clinician, Psychoeducation, Recreational therapy.   Physician Treatment Plan for Primary Diagnosis: Alcohol abuse Long Term Goal(s): Improvement in symptoms so as ready for discharge Improvement in symptoms so as ready for discharge   Short Term Goals: Ability to maintain clinical measurements within normal limits will improve Ability to identify changes in lifestyle to reduce recurrence of condition will improve Ability to identify and develop effective coping behaviors will improve Ability to identify triggers associated with substance abuse/mental health issues will improve  Medication Management: Evaluate patient's response, side effects, and tolerance of medication regimen.  Therapeutic Interventions: 1 to 1 sessions, Unit Group sessions and Medication administration.  Evaluation of Outcomes: Adequate for Sunday discharge per MD  Physician Treatment Plan for Secondary Diagnosis: Principal Problem:   Alcohol abuse Active Problems:   Major depressive disorder, recurrent severe without psychotic features (HCC)   MDD (major depressive disorder), recurrent episode, severe (HCC)  Long Term Goal(s): Improvement in symptoms so as ready for discharge Improvement in symptoms so as ready for discharge   Short Term Goals: Ability to maintain clinical measurements within normal limits will improve Ability to identify changes in lifestyle to reduce recurrence of condition will improve Ability to identify and develop effective coping behaviors will improve Ability to identify triggers associated with substance abuse/mental health issues will improve     Medication Management: Evaluate patient's response, side effects, and tolerance of medication  regimen.  Therapeutic Interventions: 1 to 1 sessions, Unit Group sessions and Medication administration.  Evaluation of Outcomes: Adequate for Sunday discharge    RN Treatment Plan for Primary Diagnosis: Alcohol abuse Long Term Goal(s): Knowledge of disease and therapeutic regimen to maintain health will improve  Short Term Goals: Ability to remain free from injury will improve, Ability to disclose and discuss suicidal ideas and Ability to identify and develop effective coping behaviors will improve  Medication Management: RN will administer medications as ordered by provider, will assess and evaluate patient's response and provide education to patient for prescribed medication. RN will report any adverse and/or side effects to prescribing provider.  Therapeutic Interventions: 1 on 1 counseling sessions, Psychoeducation, Medication administration, Evaluate responses to treatment, Monitor vital signs and CBGs as ordered, Perform/monitor CIWA, COWS, AIMS and Fall Risk screenings as ordered, Perform wound care treatments as ordered.  Evaluation of Outcomes: Met   LCSW Treatment Plan for Primary Diagnosis: Alcohol abuse Long Term Goal(s): Safe transition to appropriate next level of care at discharge, Engage patient in therapeutic  group addressing interpersonal concerns.  Short Term Goals: Engage patient in aftercare planning with referrals and resources, Facilitate patient progression through stages of change regarding substance use diagnoses and concerns and Identify triggers associated with mental health/substance abuse issues  Therapeutic Interventions: Assess for all discharge needs, 1 to 1 time with Social worker, Explore available resources and support systems, Assess for adequacy in community support network, Educate family and significant other(s) on suicide prevention, Complete Psychosocial Assessment, Interpersonal group therapy.  Evaluation of Outcomes: Met   Progress in  Treatment: Attending groups: Yes Participating in groups: Yes Taking medication as prescribed: Yes. Toleration medication: Yes. Family/Significant other contact made:SPE completed with pt's husband. Patient understands diagnosis: Yes. Discussing patient identified problems/goals with staff: Yes. Medical problems stabilized or resolved: Yes. Denies suicidal/homicidal ideation: Yes. Issues/concerns per patient self-inventory: No. Other: n/a  New problem(s) identified: No, Describe:  n/a  New Short Term/Long Term Goal(s): medication stabilization, detox, and follow-up with outpatient provider(s).   Discharge Plan or Barriers: Pt has follow-up with Dr. Toy Care scheduled. Per her husband, they may not be able to afford this appt. Pt plans to transition to [place] starting in January 2018 due to getting disability and switching to Medicare soon. Appts for medication management and counseling have been scheduled at [facility] in [place].   Reason for Continuation of Hospitalization: Medication management   Estimated Length of Stay: 2 days (sunday discharge scheduled per Dr. Sharolyn Douglas). 2 months medication refill requested.   Attendees: Gina Daniels 11:45 AM  Physician: Dr. Sharolyn Douglas MD Daniels 11:45 AM  Nursing: Ammie Ferrier RN Daniels 11:45 AM  RN Care Manager: Lars Pinks CM Daniels 11:45 AM  Social Worker: Maxie Better, LCSW Daniels 11:45 AM  Recreational Therapist:  10/19/2016 11:45 AM  Other:  Daniels 11:45 AM  Other:  Daniels 11:45 AM  Other: Daniels 11:45 AM    Scribe for Treatment Team: Kimber Relic Smart, LCSW Daniels 11:45 AM

## 2016-10-20 DIAGNOSIS — F1010 Alcohol abuse, uncomplicated: Secondary | ICD-10-CM

## 2016-10-20 DIAGNOSIS — Z8249 Family history of ischemic heart disease and other diseases of the circulatory system: Secondary | ICD-10-CM

## 2016-10-20 DIAGNOSIS — Z79899 Other long term (current) drug therapy: Secondary | ICD-10-CM

## 2016-10-20 DIAGNOSIS — F17210 Nicotine dependence, cigarettes, uncomplicated: Secondary | ICD-10-CM

## 2016-10-20 DIAGNOSIS — Z833 Family history of diabetes mellitus: Secondary | ICD-10-CM

## 2016-10-20 LAB — T4, FREE: FREE T4: 0.67 ng/dL (ref 0.61–1.12)

## 2016-10-20 LAB — TSH: TSH: 3.584 u[IU]/mL (ref 0.350–4.500)

## 2016-10-20 MED ORDER — VENLAFAXINE HCL ER 150 MG PO CP24
187.5000 mg | ORAL_CAPSULE | Freq: Every day | ORAL | Status: DC
  Administered 2016-10-21 – 2016-10-22 (×2): 187.5 mg via ORAL
  Filled 2016-10-20 (×3): qty 1

## 2016-10-20 MED ORDER — TRAZODONE HCL 50 MG PO TABS: 50.0000 mg | ORAL_TABLET | Freq: Every evening | ORAL | Status: DC | PRN

## 2016-10-20 NOTE — [facility] Group Notes (Addendum)
Identifying Needs    0915 10/20/2016  Type of Therapy:  Education :  Identifying Needs:  The group is focused on teaching patients how to identify their needs as a coping skill.  Participation Level:  Good  Participation Quality:  Good  Affect:  Flat   Cognitive: Intact   Insight:  Intact  Engagement in Group: Engaged  Modes of Intervention:  Discussion / Education  Summary of Progress/Problems:  Gina Daniels, Gina [name] 10/20/2016, 12:29 PM

## 2016-10-20 NOTE — Progress Notes (Signed)
D   Pt is pleasant on approach and cooperative   She continues to be loud and argumentative especially talking with her husband but overall is much more pleasant than yesterday   She reports improved mood    A   Verbal support given    Medications administered and effectiveness monitored    Q 15 min checks R   Pt is safe at present time

## 2016-10-20 NOTE — Progress Notes (Signed)
[facility]  10/20/2016 4:34 PM Gina Daniels  MRN:  [id number] Subjective:   Patient seen, chart reviewed and case discussed with nursing staff. The patient states that she has been feeling anxious after having an argument with her husband since yesterday. She continues to have insomnia with nighttime awakening. She denies any craving for alcohol or marijuana. She denies SI.  Principal Problem: Alcohol abuse Diagnosis:   Patient Active Problem List   Diagnosis Date Noted  . Alcohol abuse [F10.10] 10/17/2016  . Major depressive disorder, recurrent severe without psychotic features (HCC) [F33.2] 10/17/2016  . MDD (major depressive disorder), recurrent episode, severe (HCC) [F33.2] 10/17/2016  . Postoperative wound dehiscence [T81.31XA] 10/05/2014   Total Time spent with patient: 15 minutes  Past Psychiatric History: see HPI  Past Medical History:  Past Medical History:  Diagnosis Date  . Anorexia   . Anorexia nervosa   . Anxiety   . Depression   . 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 Gallstones   . Headache   . Hypertension   . PTSD (post-traumatic stress disorder)     Past Surgical History:  Procedure Laterality Date  . CHALAZION EXCISION     2012 LEFT EYE  . FOOT SURGERY     RIGHT   . INCISION AND DRAINAGE Right 10/05/2014   Procedure: INCISION AND DRAINAGE RIGHT FOOT WOUND WITH APPLICATION OF WOUND VAC;  Surgeon: Toni ArthursJohn Hewitt, MD;  Location: [facility] OR;  Service: Orthopedics;  Laterality: Right;  . ORIF ULNAR FRACTURE Left 08/22/2016   Procedure: OPEN REDUCTION INTERNAL FIXATION (ORIF)  LEFT ULNA FRACTURE;  Surgeon: Tarry KosNaiping M Xu, MD;  Location: [facility] OR;  Service: Orthopedics;  Laterality: Left;  . TUBAL LIGATION  2002  . tubal reversal     02/2014  . WISDOM TOOTH EXTRACTION  2002   Family History:  Family History  Problem Relation Age of Onset  . Hypertension Mother   . Hypertension Father   . Heart attack Father   . Diabetes Maternal  Grandmother    Family Psychiatric  History: see HPI Social History:  History  Alcohol Use  . Yes    Comment: OCC     History  Drug Use  . Types: Marijuana    Comment: daily    Social History   Social History  . Marital status: Married    Spouse name: N/A  . Number of children: N/A  . Years of education: N/A   Social History Main Topics  . Smoking status: Current Every Day Smoker    Packs/day: 1.00    Years: 21.00    Types: Cigarettes  . Smokeless tobacco: Never Used  . Alcohol use Yes     Comment: OCC  . Drug use:     Types: Marijuana     Comment: daily  . Sexual activity: Yes    Birth control/ protection: Other-see comments   Other Topics Concern  . None   Social History Narrative  . None   Additional Social History:                         Sleep: Poor  Appetite:  Fair  Current Medications: Current Facility-Administered Medications  Medication Dose Route Frequency Provider Last Rate Last Dose  . acetaminophen (TYLENOL) tablet 650 mg  650 mg Oral Q6H PRN Kerry HoughSpencer E Simon, PA-C   650 mg at 10/20/16 1315  . alum & mag hydroxide-simeth (MAALOX/MYLANTA) 200-200-20  MG/5ML suspension 30 mL  30 mL Oral Q4H PRN Kerry HoughSpencer E Simon, PA-C      . hydrOXYzine (ATARAX/VISTARIL) tablet 50 mg  50 mg Oral QHS PRN Acquanetta SitElizabeth Woods Oates, MD   50 mg at 10/19/16 2153  . ibuprofen (ADVIL,MOTRIN) tablet 600 mg  600 mg Oral Q8H PRN Charm RingsJamison Y Lord, NP   600 mg at 10/20/16 0810  . lamoTRIgine (LAMICTAL) tablet 100 mg  100 mg Oral BID Acquanetta SitElizabeth Woods Oates, MD   100 mg at 10/20/16 0806  . LORazepam (ATIVAN) tablet 1 mg  1 mg Oral Daily Fernando A Cobos, MD      . magnesium hydroxide (MILK OF MAGNESIA) suspension 30 mL  30 mL Oral Daily PRN Kerry HoughSpencer E Simon, PA-C      . multivitamin with minerals tablet 1 tablet  1 tablet Oral Daily Craige CottaFernando A Cobos, MD   1 tablet at 10/20/16 0806  . naltrexone (DEPADE) tablet 25 mg  25 mg Oral Daily Acquanetta SitElizabeth Woods Oates, MD      . nicotine  (NICODERM CQ - dosed in mg/24 hours) patch 21 mg  21 mg Transdermal Daily Charm RingsJamison Y Lord, NP   21 mg at 10/20/16 [id number]  . prazosin (MINIPRESS) capsule 4 mg  4 mg Oral QHS Acquanetta SitElizabeth Woods Oates, MD   4 mg at 10/19/16 2153  . thiamine (VITAMIN B-1) tablet 100 mg  100 mg Oral Daily Craige CottaFernando A Cobos, MD   100 mg at 10/20/16 0806  . venlafaxine XR (EFFEXOR-XR) 24 hr capsule 150 mg  150 mg Oral Q breakfast Acquanetta SitElizabeth Woods Oates, MD   150 mg at 10/20/16 [id number]    Lab Results:  Results for orders placed or performed during the hospital encounter of 10/17/16 (from the past 48 hour(s))  T4, free     Status: None   Collection Time: 10/20/16  6:27 AM  Result Value Ref Range   Free T4 0.67 0.61 - 1.12 ng/dL    Comment: (NOTE) Biotin ingestion may interfere with free T4 tests. If the results are inconsistent with the TSH level, previous test results, or the clinical presentation, then consider biotin interference. If needed, order repeat testing after stopping biotin. [facility]   TSH     Status: None   Collection Time: 10/20/16  6:27 AM  Result Value Ref Range   TSH 3.584 0.350 - 4.500 uIU/mL    Comment: [facility]    Blood Alcohol level:  Lab Results  Component Value Date   ETH 137 (H) 10/16/2016   ETH (H) 03/23/2010    112        LOWEST DETECTABLE LIMIT FOR SERUM ALCOHOL IS 5 mg/dL FOR MEDICAL PURPOSES ONLY    Metabolic Disorder Labs: No results found for: HGBA1C, MPG No results found for: PROLACTIN No results found for: CHOL, TRIG, HDL, CHOLHDL, VLDL, LDLCALC  Physical Findings: AIMS: Facial and Oral Movements Muscles of Facial Expression: None, normal Lips and Perioral Area: None, normal Jaw: None, normal Tongue: None, normal,Extremity Movements Upper (arms, wrists, hands, fingers): None, normal Lower (legs, knees, ankles, toes): None, normal, Trunk Movements Neck, shoulders, hips: None, normal, Overall Severity Severity of  abnormal movements (highest score from questions above): None, normal Incapacitation due to abnormal movements: None, normal Patient's awareness of abnormal movements (rate only patient's report): No Awareness, Dental Status Current problems with teeth and/or dentures?: Yes Does patient usually wear dentures?: Yes  CIWA:  CIWA-Ar Total: 5 COWS:  Musculoskeletal: Strength & Muscle Tone: within normal limits Gait & Station: normal Patient leans: N/A  Psychiatric Specialty Exam: Physical Exam  Review of Systems  Psychiatric/Behavioral: Positive for depression. Negative for hallucinations, substance abuse and suicidal ideas. The patient is nervous/anxious and has insomnia.   All other systems reviewed and are negative.   Blood pressure (!) 145/97, pulse 97, temperature 98.2 F (36.8 C), temperature source Oral, resp. rate 18, height 5\' 5"  (1.651 m), weight 150 lb (68 kg), last menstrual period 10/12/2016, SpO2 99 %.Body mass index is 24.96 kg/m.  General Appearance: Casual  Eye Contact:  Fair  Speech:  Clear and Coherent  Volume:  Normal  Mood:  Anxious  Affect:  anxious]  Thought Process:  Coherent and Goal Directed  Orientation:  Full (Time, Place, and Person)  Thought Content:  Logical Perceptions: denies AH/VH  Suicidal Thoughts:  No  Homicidal Thoughts:  No  Memory:  Immediate;   Good Recent;   Good Remote;   Good  Judgement:  Fair  Insight:  Fair  Psychomotor Activity:  Normal  Concentration:  Concentration: Fair and Attention Span: Fair  Recall:  [employer] of Knowledge:  Fair  Language:  Good  Akathisia:  No  Handed:  Right  AIMS (if indicated):     Assets:  Communication Skills Desire for Improvement  ADL's:  Intact  Cognition:  WNL  Sleep:  Number of Hours: 6.75   Assessment Gina Daniels is a 35 year old female with alcohol use disorder, depression, PTSD, anorexia nervosa, who presented with worsening SI in the setting of financial strain and family  discordance.   Today's exam is notable for her tense affect and  Patient endorses significant anxiety since she had an argument with her husband over the phone. Will uptitrate Effexor to optimize its effect. Continue other medication. Noted that her BP remains high, although she does not have tachycardia on today's exam. Less likely to be alcohol withdrawal. Continue to monitor.   Plan - Increase Effexor 187.5 mg daily - Continue lamotrigine 100 mg BID - Continue Ativan protocol - Continue prazosin 4 mg qhs - Continue hydroxyzine prn for anxiety, Trazodone for insomnia - TSH/free T4 reviewed; wnl. Patient is advised to be rechecked in one year   Treatment Plan Summary: Daily contact with patient to assess and evaluate symptoms and progress in treatment  Neysa Hotter, MD 10/20/2016, 4:34 PM

## 2016-10-20 NOTE — Progress Notes (Addendum)
Data. Patient denies SI/HI/AVH.  Patient  Noted rubbing a female patient's shoulders this shift. She was redirectable, but she needed explaination as to why touching another patient was inappropriate. She has also continued to make inappropriate comments to peers and saff. She was observed, after a phone call saying in the hall, "We I will just cut my fucking wrists and see if he like that". Patient continues to have minimal insight into her behavior and speech. Patient also reports that she is concerned about going home as her husband, "Will just be using and smoking and I'm going to have to watch him". On her self assessment she reports, 3/10 for hopelessness and depression and 5/10 for anxiety. her goal today is: "To get through the day without crying or getting angry with anyone".  Action. Emotional support and encouragement offered. Education provided on medication, indications and side effect. Q 15 minute checks done for safety. Response. Safety on the unit maintained through 15 minute checks.  Medications taken as prescribed. Attended groups. Remained calm and appropriate through out shift.

## 2016-10-20 NOTE — [facility] Group Notes (Signed)
[facility] LCSW Group Therapy Note  10/20/2016 and 10:00 AM  Type of Therapy and Topic:  Group Therapy: Avoiding Self-Sabotaging and Enabling Behaviors  Participation Level:  Minimal  Participation Quality:  Hesitant  Affect:  Anxious and Tearful  Cognitive:  Alert and Oriented  Insight:  Limited  Engagement in Therapy:  Limited   Modes of Intervention:  Discussion, Exploration, Orientation, Rapport Building, Socialization and Support   Summary of Progress/Problems:  The main focus of today's process group was for the patient to identify ways in which they have in the past sabotaged their own recovery. Motivational Interviewing was utilized to identify motivation they may have for wanting to change. The Stages of Change were explained using a handout, and patients identified where they currently are with regard to stages of change. Patient was hesitant to participate and shared minimally when given several opportunities. Patient did share that she is considering discharging "somewhere safe verses home as I will probably use within 24 hours if I go there."   Gina Bernatherine C Hiawatha Merriott, LCSW

## 2016-10-20 NOTE — [facility] Group Notes (Signed)
[facility] Group Notes:  (Nursing/MHT/Case Management/Adjunct)  Date:  10/20/2016  Time:  5:53 PM  Type of Therapy:  Nurse Education  Participation Level:  Active  Participation Quality:  Intrusive  Affect:  Appropriate  Cognitive:  Appropriate  Insight:  Lacking  Engagement in Group:  Engaged  Modes of Intervention:  Discussion and Problem-solving  Summary of Progress/Problems:  Patient was able to identify five personal positive attributes.  Almira Barenny G Gardner Servantes 10/20/2016, 5:53 PM

## 2016-10-21 NOTE — Progress Notes (Signed)
D   Pt is pleasant on approach and cooperative   She continues to be loud and argumentative  but overall is much more pleasant than yesterday   She reports improved mood   Her goal today was to manage her irritability better and she feels like she has met her goal A   Verbal support given    Medications administered and effectiveness monitored    Q 15 min checks R   Pt is safe at present time

## 2016-10-21 NOTE — [facility] Group Notes (Signed)
[facility] LCSW Group Therapy  10/21/2016 10 - 10:55 AM  Type of Therapy:  Group Therapy  Participation Level:  Minimal  Participation Quality:  Sharing  Affect:  Anxious and Flat  Cognitive:  Oriented  Insight:  Limited  Engagement in Therapy:  Limited  Modes of Intervention:  Activity, Discussion, Exploration, Socialization and Support  Summary of Progress/Problems: Summary of Progress/Problems: The main focus of today's process group was to identify the patient's current support system and decide on other supports that can be put in place. An emphasis was placed on using counselor, doctor, therapy groups, 12-step groups, and problem-specific support groups to expand supports. There was also an extensive discussion about what constitutes a healthy support versus an unhealthy support. Pt engaged easily during group session. As patients processed their anxiety about discharge and described healthy supports patient shared realization that she needs new supports.  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     Gina Daniels

## 2016-10-21 NOTE — Progress Notes (Signed)
[facility]  10/21/2016 11:55 AM Gina Daniels  MRN:  [id number] Subjective:   Patient seen, chart reviewed and case discussed with nursing staff. The patient states that she continues to feel anxious. She talks about her husband who has ear infection, who is upset that the patient is not at home, Although he initially told her that she should take care of herself at the hospital. She states that they have been together for 13 years and denies any safety concern and hopes to be discharged tomorrow.   She has panic attack once or twice a day. She denies SI.  Principal Problem: Alcohol abuse Diagnosis:   Patient Active Problem List   Diagnosis Date Noted  . Alcohol abuse [F10.10] 10/17/2016  . Major depressive disorder, recurrent severe without psychotic features (HCC) [F33.2] 10/17/2016  . MDD (major depressive disorder), recurrent episode, severe (HCC) [F33.2] 10/17/2016  . Postoperative wound dehiscence [T81.31XA] 10/05/2014   Total Time spent with patient: 15 minutes  Past Psychiatric History: see HPI  Past Medical History:  Past Medical History:  Diagnosis Date  . Anorexia   . Anorexia nervosa   . Anxiety   . Depression   . 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 Gallstones   . Headache   . Hypertension   . PTSD (post-traumatic stress disorder)     Past Surgical History:  Procedure Laterality Date  . CHALAZION EXCISION     2012 LEFT EYE  . FOOT SURGERY     RIGHT   . INCISION AND DRAINAGE Right 10/05/2014   Procedure: INCISION AND DRAINAGE RIGHT FOOT WOUND WITH APPLICATION OF WOUND VAC;  Surgeon: Toni ArthursJohn Hewitt, MD;  Location: [facility] OR;  Service: Orthopedics;  Laterality: Right;  . ORIF ULNAR FRACTURE Left 08/22/2016   Procedure: OPEN REDUCTION INTERNAL FIXATION (ORIF)  LEFT ULNA FRACTURE;  Surgeon: Tarry KosNaiping M Xu, MD;  Location: [facility] OR;  Service: Orthopedics;  Laterality: Left;  . TUBAL LIGATION  2002  . tubal reversal     02/2014  . WISDOM TOOTH  EXTRACTION  2002   Family History:  Family History  Problem Relation Age of Onset  . Hypertension Mother   . Hypertension Father   . Heart attack Father   . Diabetes Maternal Grandmother    Family Psychiatric  History: see HPI Social History:  History  Alcohol Use  . Yes    Comment: OCC     History  Drug Use  . Types: Marijuana    Comment: daily    Social History   Social History  . Marital status: Married    Spouse name: N/A  . Number of children: N/A  . Years of education: N/A   Social History Main Topics  . Smoking status: Current Every Day Smoker    Packs/day: 1.00    Years: 21.00    Types: Cigarettes  . Smokeless tobacco: Never Used  . Alcohol use Yes     Comment: OCC  . Drug use:     Types: Marijuana     Comment: daily  . Sexual activity: Yes    Birth control/ protection: Other-see comments   Other Topics Concern  . None   Social History Narrative  . None   Additional Social History:                         Sleep: Poor  Appetite:  Fair  Current Medications: Current Facility-Administered Medications  Medication Dose Route Frequency Provider Last Rate Last Dose  . acetaminophen (TYLENOL) tablet 650 mg  650 mg Oral Q6H PRN Kerry HoughSpencer E Simon, PA-C   650 mg at 10/20/16 1315  . alum & mag hydroxide-simeth (MAALOX/MYLANTA) 200-200-20 MG/5ML suspension 30 mL  30 mL Oral Q4H PRN Kerry HoughSpencer E Simon, PA-C      . hydrOXYzine (ATARAX/VISTARIL) tablet 50 mg  50 mg Oral QHS PRN Acquanetta SitElizabeth Woods Oates, MD   50 mg at 10/19/16 2153  . ibuprofen (ADVIL,MOTRIN) tablet 600 mg  600 mg Oral Q8H PRN Charm RingsJamison Y Lord, NP   600 mg at 10/20/16 0810  . lamoTRIgine (LAMICTAL) tablet 100 mg  100 mg Oral BID Acquanetta SitElizabeth Woods Oates, MD   100 mg at 10/21/16 0836  . magnesium hydroxide (MILK OF MAGNESIA) suspension 30 mL  30 mL Oral Daily PRN Kerry HoughSpencer E Simon, PA-C      . multivitamin with minerals tablet 1 tablet  1 tablet Oral Daily Craige CottaFernando A Cobos, MD   1 tablet at 10/21/16  0836  . naltrexone (DEPADE) tablet 25 mg  25 mg Oral Daily Acquanetta SitElizabeth Woods Oates, MD   25 mg at 10/21/16 0836  . nicotine (NICODERM CQ - dosed in mg/24 hours) patch 21 mg  21 mg Transdermal Daily Charm RingsJamison Y Lord, NP   21 mg at 10/21/16 0942  . prazosin (MINIPRESS) capsule 4 mg  4 mg Oral QHS Acquanetta SitElizabeth Woods Oates, MD   4 mg at 10/20/16 2307  . thiamine (VITAMIN B-1) tablet 100 mg  100 mg Oral Daily Craige CottaFernando A Cobos, MD   100 mg at 10/21/16 0836  . traZODone (DESYREL) tablet 50 mg  50 mg Oral QHS PRN Neysa Hottereina Earlyne Feeser, MD      . venlafaxine XR (EFFEXOR-XR) 24 hr capsule 187.5 mg  187.5 mg Oral Daily Neysa Hottereina Tajha Sammarco, MD   187.5 mg at 10/21/16 [id number]    Lab Results:  Results for orders placed or performed during the hospital encounter of 10/17/16 (from the past 48 hour(s))  T4, free     Status: None   Collection Time: 10/20/16  6:27 AM  Result Value Ref Range   Free T4 0.67 0.61 - 1.12 ng/dL    Comment: (NOTE) Biotin ingestion may interfere with free T4 tests. If the results are inconsistent with the TSH level, previous test results, or the clinical presentation, then consider biotin interference. If needed, order repeat testing after stopping biotin. [facility]   TSH     Status: None   Collection Time: 10/20/16  6:27 AM  Result Value Ref Range   TSH 3.584 0.350 - 4.500 uIU/mL    Comment: [facility]    Blood Alcohol level:  Lab Results  Component Value Date   ETH 137 (H) 10/16/2016   ETH (H) 03/23/2010    112        LOWEST DETECTABLE LIMIT FOR SERUM ALCOHOL IS 5 mg/dL FOR MEDICAL PURPOSES ONLY    Metabolic Disorder Labs: No results found for: HGBA1C, MPG No results found for: PROLACTIN No results found for: CHOL, TRIG, HDL, CHOLHDL, VLDL, LDLCALC  Physical Findings: AIMS: Facial and Oral Movements Muscles of Facial Expression: None, normal Lips and Perioral Area: None, normal Jaw: None, normal Tongue: None, normal,Extremity  Movements Upper (arms, wrists, hands, fingers): None, normal Lower (legs, knees, ankles, toes): None, normal, Trunk Movements Neck, shoulders, hips: None, normal, Overall Severity Severity of abnormal movements (highest score from questions above): None, normal  Incapacitation due to abnormal movements: None, normal Patient's awareness of abnormal movements (rate only patient's report): No Awareness, Dental Status Current problems with teeth and/or dentures?: Yes Does patient usually wear dentures?: Yes  CIWA:  CIWA-Ar Total: 3 COWS:     Musculoskeletal: Strength & Muscle Tone: within normal limits Gait & Station: normal Patient leans: N/A  Psychiatric Specialty Exam: Physical Exam  Review of Systems  Psychiatric/Behavioral: Positive for depression. Negative for hallucinations, substance abuse and suicidal ideas. The patient is nervous/anxious and has insomnia.   All other systems reviewed and are negative.   Blood pressure (!) 149/88, pulse 92, temperature 98 F (36.7 C), temperature source Oral, resp. rate 17, height 5\' 5"  (1.651 m), weight 150 lb (68 kg), last menstrual period 10/12/2016, SpO2 99 %.Body mass index is 24.96 kg/m.  General Appearance: Casual  Eye Contact:  Fair  Speech:  Clear and Coherent  Volume:  Normal  Mood:  Anxious  Affect:  anxious]  Thought Process:  Coherent and Goal Directed  Orientation:  Full (Time, Place, and Person)  Thought Content:  Logical Perceptions: denies AH/VH  Suicidal Thoughts:  No  Homicidal Thoughts:  No  Memory:  Immediate;   Good Recent;   Good Remote;   Good  Judgement:  Fair  Insight:  Fair  Psychomotor Activity:  Normal  Concentration:  Concentration: Fair and Attention Span: Fair  Recall:  [employer] of Knowledge:  Fair  Language:  Good  Akathisia:  No  Handed:  Right  AIMS (if indicated):     Assets:  Communication Skills Desire for Improvement  ADL's:  Intact  Cognition:  WNL  Sleep:  Number of Hours: 6.25    Assessment Gina Daniels is a 35 year old female with alcohol use disorder, depression, PTSD, anorexia nervosa, who presented with worsening SI in the setting of financial strain and family discordance.   #MDD # r/o substance induced mood disorder Patient affect continues to be tense, although there has been some improvement compared to yesterday. Will continue current medication. We will plan to discharge her tomorrow. No significant findings consistent with alcohol withdrawal except hypertension; will recommend outpatient follow up.   Plan -  Continue Effexor 187.5 mg daily (monitor BP) - Continue lamotrigine 100 mg BID - Continue Ativan protocol - Continue prazosin 4 mg qhs - Continue hydroxyzine prn for anxiety, Trazodone for insomnia - TSH/free T4 reviewed; wnl. Patient is advised to be rechecked in one year   Treatment Plan Summary: Daily contact with patient to assess and evaluate symptoms and progress in treatment  Neysa Hotter, MD 10/21/2016, 11:55 AM

## 2016-10-21 NOTE — Progress Notes (Signed)
Pt has been attending the program and interacting with select peers. Pt rates her depression at a 5, hopelessness at a 3 and her anxiety at a 5. Denies SI and HI. Pt writes that her goal today is to "keep my irritability and agitation in check".  Given support, reassurance and praise. Provided with a brief 1:1. Praised for her ability to keep her anger in check. Has attended the program and has been appropriate with her behavior

## 2016-10-22 MED ORDER — VENLAFAXINE HCL ER 37.5 MG PO CP24
37.5000 mg | ORAL_CAPSULE | Freq: Every day | ORAL | Status: DC
  Filled 2016-10-22 (×2): qty 1

## 2016-10-22 MED ORDER — HYDROXYZINE HCL 50 MG PO TABS: 50.0000 mg | ORAL_TABLET | Freq: Every evening | ORAL | 0 refills | Status: DC | PRN

## 2016-10-22 MED ORDER — THIAMINE HCL 100 MG PO TABS: 100.0000 mg | ORAL_TABLET | Freq: Every day | ORAL | 0 refills | Status: DC

## 2016-10-22 MED ORDER — NICOTINE 21 MG/24HR TD PT24: 21.0000 mg | MEDICATED_PATCH | Freq: Every day | TRANSDERMAL | 0 refills | Status: AC

## 2016-10-22 MED ORDER — VENLAFAXINE HCL ER 150 MG PO CP24
150.0000 mg | ORAL_CAPSULE | Freq: Every day | ORAL | Status: DC
  Filled 2016-10-22 (×2): qty 1

## 2016-10-22 MED ORDER — VENLAFAXINE HCL ER 150 MG PO CP24: 150.0000 mg | ORAL_CAPSULE | Freq: Every day | ORAL | 0 refills | Status: DC

## 2016-10-22 MED ORDER — PRAZOSIN HCL 2 MG PO CAPS: 4.0000 mg | ORAL_CAPSULE | Freq: Every day | ORAL | 0 refills | Status: DC

## 2016-10-22 MED ORDER — NALTREXONE HCL 50 MG PO TABS: 25.0000 mg | ORAL_TABLET | Freq: Every day | ORAL | 0 refills | Status: DC

## 2016-10-22 MED ORDER — VENLAFAXINE HCL ER 37.5 MG PO CP24: 37.5000 mg | ORAL_CAPSULE | Freq: Every day | ORAL | 0 refills | Status: DC

## 2016-10-22 MED ORDER — LAMOTRIGINE 100 MG PO TABS: 100.0000 mg | ORAL_TABLET | Freq: Two times a day (BID) | ORAL | 0 refills | Status: DC

## 2016-10-22 MED ORDER — TRAZODONE HCL 50 MG PO TABS: 50.0000 mg | ORAL_TABLET | Freq: Every evening | ORAL | 0 refills | Status: AC | PRN

## 2016-10-22 NOTE — Progress Notes (Signed)
Recreation Therapy Notes  Date: 10/22/16 Time: 0930 Location: [address] Dayroom  Group Topic: Stress Management  Goal Area(s) Addresses:  Patient will verbalize importance of using healthy stress management.  Patient will identify positive emotions associated with healthy stress management.   Behavioral Response: Engaged  Intervention: Calm App  Activity :  Body Scan Meditation.  LRT introduced the stress management technique of meditation.  LRT played a body scan meditation from the Calm app to allow the patients to engage in the activity.  Patients were to follow along as the meditation was being played.  Education:  Stress Management, Discharge Planning.   Education Outcome: Acknowledges edcuation/In group clarification offered/Needs additional education  Clinical Observations/Feedback: Pt attended group.   Caroll RancherMarjette Dionicio Shelnutt, LRT/CTRS         Caroll RancherLindsay, Ronith Berti A 10/22/2016 12:10 PM

## 2016-10-22 NOTE — Progress Notes (Signed)
D:  Patient's self inventory sheet, patient has fair sleep, sleep medication not given.  Poor appetite, normal energy level, OK concentration.  Rated depression and anxiety 3, hopeless 2.  Withdrawals, agitation, runny nose, irritability, chilling.  Denied SI.  Physical problems, pain, headaches.  Physical pain, left arm, no pain medication.  Goal is to be positive.  Plans to smile more, stay out of room.  Plans to talk to MD today.  Does have discharge plans. A:  Medications administered per MD orders.  Emotional support and encouragement given patient. R:  Denied SI and HI, contracts for safety.  Denied A/V hallucinations.  Safety maintained with 15 minute checks.
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
## 2016-10-22 NOTE — Progress Notes (Signed)
  [facility] :  Will you be returning to the same living situation after discharge:  Yes, patient plans to return home At discharge, do you have transportation home?: Yes, family Do you have the ability to pay for your medications: Yes, patient will be provided with prescriptions at discharge  Release of information consent forms completed and submitted to medical records by CSW.  Patient to Follow up at: Follow-up Information    Gina BickersKAUR,Gina DHAMI, MD Follow up on 10/24/2016.   Specialty:  Psychiatry Why:  Appt on this date at 6:30PM with Dr. Evelene Daniels for hospital follow-up/medication management. Please call within 48 hours of appt to cancel or reschedule if necessary. Thank you.   Contact information: [address] [address] [place] [address] [phone]        [facility] Follow up on 01/09/2017.   Why:  Appt on this date at 10:30AM for medication management with Dr. Lolly Daniels. Thank you.   Contact information: [address]  [place], [place] [address] Phone: [phone] Fax: [phone]       [facility] Follow up on 11/26/2016.   Why:  Appt on this date at 10:00AM for counseling with Gina Daniels. Please bring your insurance card to this appt and arrive by 10am to complete new patient paperwork. Thank you!  Contact information: [address]  [place], [place] [address] Phone: [phone] Fax: [phone]          Next level of care provider has access to [facility] Link:no  Safety Planning and Suicide Prevention discussed: Yes,  SPE completed with pt's husband. Pt provided with SPI pamphlet and Mobile Crisis information.  Have you used any form of tobacco in the last 30 days? (Cigarettes, Smokeless Tobacco, Cigars, and/or Pipes): Yes  Has patient been referred to the Quitline?: Patient refused referral  Patient has been referred for addiction treatment:  Yes  Gina Daniels L Gina Masullo LCSW 10/22/2016, 10:33 AM
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
## 2016-10-22 NOTE — Progress Notes (Signed)
Discharge Note:  Patient discharged home with family member.  Patient denied SI and HI.  Denied A/V hallucinations.  Suicide prevention information given and discussed with patient who stated she understood and had no questions.  Patient stated she received all her belongings, clothing, toiletries, misc items, prescriptions.  Patient stated she appreciated all assistance received from [facility] staff.  All required discharge information given to patient at discharge.  

## 2016-10-22 NOTE — [facility] Group Notes (Signed)
Pt attended spiritual care group on grief and loss facilitated by chaplain Gina Daniels   Group opened with brief discussion and psycho-social ed around grief and loss in relationships and in relation to self - identifying life patterns, circumstances, changes that cause losses. Established group norm of speaking from own life experience. Group goal of establishing open and affirming space for members to share loss and experience with grief, normalize grief experience and provide psycho social education and grief support.    Gina Daniels was present throughout group.  Alert and oriented, she engaged voluntarily.   Described feeling sadness, fear, guilt, regret around the death of her friend two years prior.  Is still connected to friend's 465 year old daughter.  Gina Daniels is motivated by her relationship with child.    Spoke with group of relationship with her spouse, whom is still drinking.  Gina Daniels is communicating to spouse that she needs support and will be attending 12 step programs.  Is hopeful that spouse will attend with her.  She spoke with group about setting boundaries with spouse.  This is worrisome for her, as she told her deceased friend that she could not continue to see her if she used heroin.  Friend was found dead the next day.

## 2016-10-22 NOTE — Tx Team (Signed)
Interdisciplinary Treatment and Diagnostic Plan Update  10/22/2016 Time of Session: 9:30AM Gina Daniels MRN: [id number]  Principal Diagnosis: Alcohol abuse  Secondary Diagnoses: Principal Problem:   Alcohol abuse Active Problems:   Major depressive disorder, recurrent severe without psychotic features (HCC)   MDD (major depressive disorder), recurrent episode, severe (HCC)   Current Medications:  Current Facility-Administered Medications  Medication Dose Route Frequency Provider Last Rate Last Dose  . acetaminophen (TYLENOL) tablet 650 mg  650 mg Oral Q6H PRN Kerry Hough, PA-C   650 mg at 10/21/16 1735  . alum & mag hydroxide-simeth (MAALOX/MYLANTA) 200-200-20 MG/5ML suspension 30 mL  30 mL Oral Q4H PRN Kerry Hough, PA-C      . hydrOXYzine (ATARAX/VISTARIL) tablet 50 mg  50 mg Oral QHS PRN Acquanetta Sit, MD   50 mg at 10/19/16 2153  . ibuprofen (ADVIL,MOTRIN) tablet 600 mg  600 mg Oral Q8H PRN Charm Rings, NP   600 mg at 10/20/16 0810  . lamoTRIgine (LAMICTAL) tablet 100 mg  100 mg Oral BID Acquanetta Sit, MD   100 mg at 10/22/16 0337  . magnesium hydroxide (MILK OF MAGNESIA) suspension 30 mL  30 mL Oral Daily PRN Kerry Hough, PA-C      . multivitamin with minerals tablet 1 tablet  1 tablet Oral Daily Craige Cotta, MD   1 tablet at 10/22/16 5261  . naltrexone (DEPADE) tablet 25 mg  25 mg Oral Daily Acquanetta Sit, MD   25 mg at 10/22/16 0837  . nicotine (NICODERM CQ - dosed in mg/24 hours) patch 21 mg  21 mg Transdermal Daily Charm Rings, NP   21 mg at 10/22/16 0836  . prazosin (MINIPRESS) capsule 4 mg  4 mg Oral QHS Acquanetta Sit, MD   4 mg at 10/20/16 2307  . thiamine (VITAMIN B-1) tablet 100 mg  100 mg Oral Daily Craige Cotta, MD   100 mg at 10/22/16 0837  . traZODone (DESYREL) tablet 50 mg  50 mg Oral QHS PRN Neysa Hotter, MD      . venlafaxine XR (EFFEXOR-XR) 24 hr capsule 187.5 mg  187.5 mg Oral Daily Neysa Hotter, MD    187.5 mg at 10/22/16 6119   PTA Medications: Prescriptions Prior to Admission  Medication Sig Dispense Refill Last Dose  . Aspirin-Salicylamide-Caffeine (BC HEADACHE POWDER PO) Take 1 packet by mouth daily as needed (headache).   10/16/2016 at Unknown time  . chlordiazePOXIDE (LIBRIUM) 25 MG capsule Take 25 mg by mouth 3 (three) times daily.    10/16/2016 at Unknown time  . desvenlafaxine (PRISTIQ) 50 MG 24 hr tablet Take 50 mg by mouth daily.  6 10/16/2016 at Unknown time  . desvenlafaxine (PRISTIQ) 50 MG 24 hr tablet Take 50 mg by mouth daily.   10/16/2016 at Unknown time  . lamoTRIgine (LAMICTAL) 100 MG tablet Take 100 mg by mouth every morning.    10/16/2016 at 1130  . methocarbamol (ROBAXIN) 750 MG tablet Take 1 tablet (750 mg total) by mouth 2 (two) times daily as needed for muscle spasms. (Patient not taking: Reported on 10/16/2016) 60 tablet 0 Completed Course at Unknown time  . mirtazapine (REMERON) 15 MG tablet Take 15 mg by mouth at bedtime.   10/14/2016 at unknown time  . OLANZapine-FLUoxetine (SYMBYAX) 3-25 MG capsule Take 1 capsule by mouth every evening.   10/14/2016 at unknown time  . ondansetron (ZOFRAN) 4 MG tablet Take 1-2 tablets (4-8 mg total) by  mouth every 8 (eight) hours as needed for nausea or vomiting. (Patient not taking: Reported on 10/16/2016) 40 tablet 0 Completed Course at Unknown time  . senna-docusate (SENOKOT S) 8.6-50 MG tablet Take 1 tablet by mouth at bedtime as needed. (Patient not taking: Reported on 10/16/2016) 30 tablet 1 Completed Course at Unknown time    Patient Stressors: Financial difficulties Health problems Substance abuse  Patient Strengths: Average or above average intelligence Capable of independent living Communication skills Supportive family/friends  Treatment Modalities: Medication Management, Group therapy, Case management,  1 to 1 session with clinician, Psychoeducation, Recreational therapy.   Physician Treatment Plan for Primary  Diagnosis: Alcohol abuse Long Term Goal(s): Improvement in symptoms so as ready for discharge Improvement in symptoms so as ready for discharge   Short Term Goals: Ability to maintain clinical measurements within normal limits will improve Ability to identify changes in lifestyle to reduce recurrence of condition will improve Ability to identify and develop effective coping behaviors will improve Ability to identify triggers associated with substance abuse/mental health issues will improve  Medication Management: Evaluate patient's response, side effects, and tolerance of medication regimen.  Therapeutic Interventions: 1 to 1 sessions, Unit Group sessions and Medication administration.  Evaluation of Outcomes: Adequate for discharge  Physician Treatment Plan for Secondary Diagnosis: Principal Problem:   Alcohol abuse Active Problems:   Major depressive disorder, recurrent severe without psychotic features (HCC)   MDD (major depressive disorder), recurrent episode, severe (HCC)  Long Term Goal(s): Improvement in symptoms so as ready for discharge Improvement in symptoms so as ready for discharge   Short Term Goals: Ability to maintain clinical measurements within normal limits will improve Ability to identify changes in lifestyle to reduce recurrence of condition will improve Ability to identify and develop effective coping behaviors will improve Ability to identify triggers associated with substance abuse/mental health issues will improve     Medication Management: Evaluate patient's response, side effects, and tolerance of medication regimen.  Therapeutic Interventions: 1 to 1 sessions, Unit Group sessions and Medication administration.  Evaluation of Outcomes: Adequate for discharge    RN Treatment Plan for Primary Diagnosis: Alcohol abuse Long Term Goal(s): Knowledge of disease and therapeutic regimen to maintain health will improve  Short Term Goals: Ability to remain free  from injury will improve, Ability to disclose and discuss suicidal ideas and Ability to identify and develop effective coping behaviors will improve  Medication Management: RN will administer medications as ordered by provider, will assess and evaluate patient's response and provide education to patient for prescribed medication. RN will report any adverse and/or side effects to prescribing provider.  Therapeutic Interventions: 1 on 1 counseling sessions, Psychoeducation, Medication administration, Evaluate responses to treatment, Monitor vital signs and CBGs as ordered, Perform/monitor CIWA, COWS, AIMS and Fall Risk screenings as ordered, Perform wound care treatments as ordered.  Evaluation of Outcomes: Met   LCSW Treatment Plan for Primary Diagnosis: Alcohol abuse Long Term Goal(s): Safe transition to appropriate next level of care at discharge, Engage patient in therapeutic group addressing interpersonal concerns.  Short Term Goals: Engage patient in aftercare planning with referrals and resources, Facilitate patient progression through stages of change regarding substance use diagnoses and concerns and Identify triggers associated with mental health/substance abuse issues  Therapeutic Interventions: Assess for all discharge needs, 1 to 1 time with Social worker, Explore available resources and support systems, Assess for adequacy in community support network, Educate family and significant other(s) on suicide prevention, Complete Psychosocial Assessment, Interpersonal group therapy.  Evaluation of Outcomes: Met   Progress in Treatment: Attending groups: Yes Participating in groups: Yes Taking medication as prescribed: Yes. Toleration medication: Yes. Family/Significant other contact made:SPE completed with pt's husband. Patient understands diagnosis: Yes. Discussing patient identified problems/goals with staff: Yes. Medical problems stabilized or resolved: Yes. Denies  suicidal/homicidal ideation: Yes. Issues/concerns per patient self-inventory: No. Other: n/a  New problem(s) identified: No, Describe:  n/a  New Short Term/Long Term Goal(s): medication stabilization, detox, and follow-up with outpatient provider(s).   Discharge Plan or Barriers: Pt has follow-up with Dr. Toy Care scheduled. Per her husband, they may not be able to afford this appt. Pt plans to transition to [place] starting in January 2018 due to getting disability and switching to Medicare soon. Appts for medication management and counseling have been scheduled at [facility] in [place].   Reason for Continuation of Hospitalization: Medication management   Estimated Length of Stay: Discharge anticipated for today 10/22/16  Attendees: Patient: 10/22/2016 10:14 AM  Physician: Dr. Sharolyn Douglas, Dr. Parke Poisson MD 10/22/2016 10:14 AM  Nursing: Grayland Ormond, RN 10/22/2016 10:14 AM  RN Care Manager: Lars Pinks CM 10/22/2016 10:14 AM  Social Worker: Maxie Better, Knute Neu,  LCSW 10/22/2016 10:14 AM  Recreational Therapist:  10/22/2016 10:14 AM  Other: Fransico Him, NP 10/22/2016 10:14 AM  Other:  10/22/2016 10:14 AM   Tilden Fossa, [place] Worker [facility] [phone]

## 2016-11-12 ENCOUNTER — Ambulatory Visit (INDEPENDENT_AMBULATORY_CARE_PROVIDER_SITE_OTHER): Payer: BLUE CROSS/BLUE SHIELD | Admitting: Orthopaedic Surgery

## 2016-11-26 ENCOUNTER — Ambulatory Visit (HOSPITAL_COMMUNITY): Payer: Self-pay | Admitting: Clinical

## 2016-11-28 NOTE — Progress Notes (Unsigned)
Comprehensive Clinical Assessment (CCA) Note  11/28/2016 Tonna BoehringerJenny B Dilmore [id number]  Visit Diagnosis:   No diagnosis found.    CCA Part One  Part One has been completed on paper by the patient.  (See scanned document in Chart Review)  CCA Part Two A  Intake/Chief Complaint:     Mental Health Symptoms Depression:     Mania:     Anxiety:      Psychosis:     Trauma:     Obsessions:     Compulsions:     Inattention:     Hyperactivity/Impulsivity:     Oppositional/Defiant Behaviors:     Borderline Personality:     Other Mood/Personality Symptoms:      Mental Status Exam Appearance and self-care  Stature:     Weight:     Clothing:     Grooming:     Cosmetic use:     Posture/gait:     Motor activity:     Sensorium  Attention:     Concentration:     Orientation:     Recall/memory:     Affect and Mood  Affect:     Mood:     Relating  Eye contact:     Facial expression:     Attitude toward examiner:     Thought and Language  Speech flow:    Thought content:     Preoccupation:     Hallucinations:     Organization:     Company secretaryxecutive Functions  Fund of Knowledge:     Intelligence:     Abstraction:     Judgement:     Dance movement psychotherapisteality Testing:     Insight:     Decision Making:     Social Functioning  Social Maturity:     Social Judgement:     Stress  Stressors:     Coping Ability:     Skill Deficits:     Supports:      Family and Psychosocial History:    Childhood History:     CCA Part Two B  Employment/Work Situation:    Education:    Religion:    Leisure/Recreation:    Exercise/Diet:    CCA Part Two C  Alcohol/Drug Use:                        CCA Part Three  ASAM's:  Six Dimensions of Multidimensional Assessment  Dimension 1:  Acute Intoxication and/or Withdrawal Potential:     Dimension 2:  Biomedical Conditions and Complications:     Dimension 3:  Emotional, Behavioral, or Cognitive Conditions and Complications:      Dimension 4:  Readiness to Change:     Dimension 5:  Relapse, Continued use, or Continued Problem Potential:     Dimension 6:  Recovery/Living Environment:      Substance use Disorder (SUD)    Social Function:     Stress:     Risk Assessment- Self-Harm Potential:    Risk Assessment -Dangerous to Others Potential:    DSM5 Diagnoses: Patient Active Problem List   Diagnosis Date Noted  . Alcohol abuse 10/17/2016  . 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 MDD (major depressive disorder), recurrent episode, severe (HCC) 10/17/2016  . Postoperative wound dehiscence 10/05/2014    Patient Centered Plan: Patient is on the following Treatment Plan(s):  {CHL AMB BH OP Treatment Plans:[id number]}  Recommendations for Services/Supports/Treatments:    Treatment Plan Summary:  Referrals to Alternative Service(s): Referred to Alternative Service(s):   Place:   Date:   Time:    Referred to Alternative Service(s):   Place:   Date:   Time:    Referred to Alternative Service(s):   Place:   Date:   Time:    Referred to Alternative Service(s):   Place:   Date:   Time:     Edom Schmuhl A

## 2017-01-09 ENCOUNTER — Ambulatory Visit (HOSPITAL_COMMUNITY): Payer: Self-pay | Admitting: Psychiatry

## 2017-01-24 ENCOUNTER — Ambulatory Visit (INDEPENDENT_AMBULATORY_CARE_PROVIDER_SITE_OTHER): Payer: Medicare Other

## 2017-01-24 ENCOUNTER — Encounter (INDEPENDENT_AMBULATORY_CARE_PROVIDER_SITE_OTHER): Payer: Self-pay | Admitting: Orthopaedic Surgery

## 2017-01-24 ENCOUNTER — Ambulatory Visit (INDEPENDENT_AMBULATORY_CARE_PROVIDER_SITE_OTHER): Payer: Medicare Other | Admitting: Orthopaedic Surgery

## 2017-01-24 DIAGNOSIS — M79632 Pain in left forearm: Secondary | ICD-10-CM

## 2017-01-24 NOTE — Progress Notes (Signed)
Office Visit Note   Patient: Gina Daniels           Date of Birth: 1981-05-03           MRN: [id number] Visit Date: 01/24/2017              Requested by: Tomi BambergerSusan Fuller, NP [address] [place], [place] [id number] PCP: Tomi BambergerFULLER,SUSAN, NP   Assessment & Plan: Visit Diagnoses:  1. Left forearm pain     Plan: x-rays were reviewed with the patient and shows healed ulna fracture but the proximal end of the plate does appear to be prominent Reassurance was given that there is nothing wrong with the screw over the plate in that if this continues to bother her we could consider plate removal at a year.otherwise follow-up as needed  Follow-Up Instructions: Return if symptoms worsen or fail to improve.   Orders:  Orders Placed This Encounter  Procedures  . XR Forearm Left   No orders of the defined types were placed in this encounter.     Procedures: No procedures performed   Clinical Data: No additional findings.   Subjective: Chief Complaint  Patient presents with  . Left Arm - Pain    Gina Daniels is 5 months status po operative fixation of a segmental left ulna fracture. She comes in with a not over her proximal forearm that has been bothering her. She wants to get this checked out. She denies any constitutional symptoms.    Review of Systems Complete review of systems is negative except for history of present illness  Objective: Vital Signs: There were no vitals taken for this visit.  Physical Exam  Constitutional: She is oriented to person, place, and time. She appears well-developed and well-nourished.  Pulmonary/Chest: Effort normal.  Neurological: She is alert and oriented to person, place, and time.  Skin: Skin is warm. Capillary refill takes less than 2 seconds.  Psychiatric: She has a normal mood and affect. Her behavior is normal. Judgment and thought content normal.  Nursing note and vitals reviewed.   Ortho Exam Exam of the left forearm shows a  well-healed surgical scar. She does have a prominent proximal end of what feels like her plate. There are no skin changes and no signs of infection. There is no crepitus. Specialty Comments:  No specialty comments available.  Imaging: Xr Forearm Left  Result Date: 01/24/2017 Healing segmental ulna fracture with stable fixation.    PMFS History: Patient Active Problem List   Diagnosis Date Noted  . Left forearm pain 01/24/2017  . Alcohol abuse 10/17/2016  . 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 MDD (major depressive disorder), recurrent episode, severe (HCC) 10/17/2016  . Postoperative wound dehiscence 10/05/2014   Past Medical History:  Diagnosis Date  . Anorexia   . Anorexia nervosa   . Anxiety   . Depression   . 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 Gallstones   . Headache   . Hypertension   . PTSD (post-traumatic stress disorder)     Family History  Problem Relation Age of Onset  . Hypertension Mother   . Hypertension Father   . Heart attack Father   . Diabetes Maternal Grandmother     Past Surgical History:  Procedure Laterality Date  . CHALAZION EXCISION     2012 LEFT EYE  . FOOT SURGERY     RIGHT   . INCISION AND DRAINAGE Right 10/05/2014  Procedure: INCISION AND DRAINAGE RIGHT FOOT WOUND WITH APPLICATION OF WOUND VAC;  Surgeon: Toni Arthurs, MD;  Location: [facility] OR;  Service: Orthopedics;  Laterality: Right;  . ORIF ULNAR FRACTURE Left 08/22/2016   Procedure: OPEN REDUCTION INTERNAL FIXATION (ORIF)  LEFT ULNA FRACTURE;  Surgeon: Tarry Kos, MD;  Location: [facility] OR;  Service: Orthopedics;  Laterality: Left;  . TUBAL LIGATION  2002  . tubal reversal     02/2014  . WISDOM TOOTH EXTRACTION  2002   Social History   Occupational History  . Not on file.   Social History Main Topics  . Smoking status: Current Every Day Smoker    Packs/day: 1.00    Years: 21.00    Types: Cigarettes  .  Smokeless tobacco: Never Used  . Alcohol use Yes     Comment: OCC  . Drug use: Yes    Types: Marijuana     Comment: daily  . Sexual activity: Yes    Birth control/ protection: Other-see comments

## 2017-08-08 ENCOUNTER — Ambulatory Visit (INDEPENDENT_AMBULATORY_CARE_PROVIDER_SITE_OTHER): Payer: Medicare Other | Admitting: Surgery

## 2017-08-19 ENCOUNTER — Emergency Department (HOSPITAL_COMMUNITY): Payer: Medicare Other

## 2017-08-19 ENCOUNTER — Encounter (HOSPITAL_COMMUNITY): Payer: Self-pay | Admitting: *Deleted

## 2017-08-19 ENCOUNTER — Emergency Department (HOSPITAL_COMMUNITY)
Admission: EM | Admit: 2017-08-19 | Discharge: 2017-08-19 | Disposition: A | Discharge status: Home / Self Care | Payer: Medicare Other | Attending: Emergency Medicine | Admitting: Emergency Medicine

## 2017-08-19 DIAGNOSIS — F17210 Nicotine dependence, cigarettes, uncomplicated: Secondary | ICD-10-CM | POA: Diagnosis not present

## 2017-08-19 DIAGNOSIS — R6883 Chills (without fever): Secondary | ICD-10-CM | POA: Diagnosis not present

## 2017-08-19 DIAGNOSIS — R1110 Vomiting, unspecified: Secondary | ICD-10-CM | POA: Insufficient documentation

## 2017-08-19 DIAGNOSIS — R1031 Right lower quadrant pain: Secondary | ICD-10-CM

## 2017-08-19 DIAGNOSIS — R5383 Other fatigue: Secondary | ICD-10-CM | POA: Diagnosis not present

## 2017-08-19 DIAGNOSIS — I10 Essential (primary) hypertension: Secondary | ICD-10-CM | POA: Insufficient documentation

## 2017-08-19 DIAGNOSIS — R42 Dizziness and giddiness: Secondary | ICD-10-CM | POA: Diagnosis not present

## 2017-08-19 DIAGNOSIS — K529 Noninfective gastroenteritis and colitis, unspecified: Secondary | ICD-10-CM | POA: Insufficient documentation

## 2017-08-19 DIAGNOSIS — Z79899 Other long term (current) drug therapy: Secondary | ICD-10-CM | POA: Insufficient documentation

## 2017-08-19 LAB — URINALYSIS, ROUTINE W REFLEX MICROSCOPIC
Glucose, UA: NEGATIVE mg/dL
Hgb urine dipstick: NEGATIVE
KETONES UR: 80 mg/dL — AB
LEUKOCYTES UA: NEGATIVE
Nitrite: NEGATIVE
PROTEIN: 30 mg/dL — AB
Specific Gravity, Urine: 1.02 (ref 1.005–1.030)
pH: 6 (ref 5.0–8.0)

## 2017-08-19 LAB — CBC
HEMATOCRIT: 49.9 % — AB (ref 36.0–46.0)
HEMOGLOBIN: 17.8 g/dL — AB (ref 12.0–15.0)
MCH: 34.9 pg — AB (ref 26.0–34.0)
MCHC: 35.7 g/dL (ref 30.0–36.0)
MCV: 97.8 fL (ref 78.0–100.0)
Platelets: 198 10*3/uL (ref 150–400)
RBC: 5.1 MIL/uL (ref 3.87–5.11)
RDW: 14.2 % (ref 11.5–15.5)
WBC: 12 10*3/uL — AB (ref 4.0–10.5)

## 2017-08-19 LAB — COMPREHENSIVE METABOLIC PANEL
ALT: 80 U/L — AB (ref 14–54)
ANION GAP: 15 (ref 5–15)
AST: 112 U/L — ABNORMAL HIGH (ref 15–41)
Albumin: 3.8 g/dL (ref 3.5–5.0)
Alkaline Phosphatase: 57 U/L (ref 38–126)
BUN: <5 mg/dL — ABNORMAL LOW (ref 6–20)
CHLORIDE: 93 mmol/L — AB (ref 101–111)
CO2: 27 mmol/L (ref 22–32)
CREATININE: 0.77 mg/dL (ref 0.44–1.00)
Calcium: 9.2 mg/dL (ref 8.9–10.3)
Glucose, Bld: 89 mg/dL (ref 65–99)
POTASSIUM: 3.2 mmol/L — AB (ref 3.5–5.1)
SODIUM: 135 mmol/L (ref 135–145)
Total Bilirubin: 1.3 mg/dL — ABNORMAL HIGH (ref 0.3–1.2)
Total Protein: 7.3 g/dL (ref 6.5–8.1)

## 2017-08-19 LAB — I-STAT BETA HCG BLOOD, ED (MC, WL, AP ONLY): I-stat hCG, quantitative: <5 m[IU]/mL (ref <5)

## 2017-08-19 LAB — LIPASE, BLOOD: LIPASE: 19 U/L (ref 11–51)

## 2017-08-19 MED ORDER — SODIUM CHLORIDE 0.9 % IV BOLUS (SEPSIS)
1000.0000 mL | Freq: Once | INTRAVENOUS | Status: AC
  Administered 2017-08-19: 1000 mL via INTRAVENOUS

## 2017-08-19 MED ORDER — FENTANYL CITRATE (PF) 100 MCG/2ML IJ SOLN: 50.0000 ug | Freq: Once | INTRAMUSCULAR | Status: DC

## 2017-08-19 MED ORDER — IOPAMIDOL (ISOVUE-300) INJECTION 61%
100.0000 mL | Freq: Once | INTRAVENOUS | Status: AC | PRN
  Administered 2017-08-19: 100 mL via INTRAVENOUS

## 2017-08-19 MED ORDER — ONDANSETRON 4 MG PO TBDP: 4.0000 mg | ORAL_TABLET | Freq: Three times a day (TID) | ORAL | 0 refills | Status: DC | PRN

## 2017-08-19 MED ORDER — IOPAMIDOL (ISOVUE-300) INJECTION 61%
INTRAVENOUS | Status: AC
  Filled 2017-08-19: qty 100

## 2017-08-19 MED ORDER — FENTANYL CITRATE (PF) 100 MCG/2ML IJ SOLN
12.5000 ug | Freq: Once | INTRAMUSCULAR | Status: AC
  Administered 2017-08-19: 12.5 ug via INTRAVENOUS
  Filled 2017-08-19: qty 2

## 2017-08-19 MED ORDER — POTASSIUM CHLORIDE CRYS ER 20 MEQ PO TBCR
40.0000 meq | EXTENDED_RELEASE_TABLET | Freq: Once | ORAL | Status: AC
  Administered 2017-08-19: 40 meq via ORAL
  Filled 2017-08-19: qty 2

## 2017-08-19 MED ORDER — ONDANSETRON HCL 4 MG/2ML IJ SOLN
4.0000 mg | Freq: Once | INTRAMUSCULAR | Status: AC
  Administered 2017-08-19: 4 mg via INTRAVENOUS
  Filled 2017-08-19: qty 2

## 2017-08-19 NOTE — ED Triage Notes (Signed)
Pt complains of RLQ pain and emesis for the past 2 weeks, which became worse last week. Pt states she has also had diarrhea for the past week.

## 2017-08-19 NOTE — ED Provider Notes (Signed)
[facility]-EMERGENCY DEPT Provider Note   CSN: [id number] Arrival date & time: 08/19/17  1454     History   Chief Complaint Chief Complaint  Patient presents with  . Abdominal Pain    HPI Gina Daniels is a 36 y.o. female.  HPI Patient presents to ED for evaluation of "squeezing" right lower quadrant abdominal pain, vomiting and diarrhea for the past 2 weeks. Also reports generalized weakness, dizziness, and dehydration. She has not taking any medications for pain prior to arrival. She was sent here by her PCP for evaluation of possible appendicitis or ovarian cyst. She denies any urinary complaints, vaginal discharge, blood in stool, recent travel, chest pain, trouble breathing. She reports daily tobacco use. She also reports drinking a alcohol drinks per week. She denies any other drug use.  Past Medical History:  Diagnosis Date  . Anorexia   . Anorexia nervosa   . Anxiety   . Depression   . 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 Gallstones   . Headache   . Hypertension   . PTSD (post-traumatic stress disorder)     Patient Active Problem List   Diagnosis Date Noted  . Left forearm pain 01/24/2017  . Alcohol abuse 10/17/2016  . 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 MDD (major depressive disorder), recurrent episode, severe (HCC) 10/17/2016  . Postoperative wound dehiscence 10/05/2014    Past Surgical History:  Procedure Laterality Date  . CHALAZION EXCISION     2012 LEFT EYE  . FOOT SURGERY     RIGHT   . INCISION AND DRAINAGE Right 10/05/2014   Procedure: INCISION AND DRAINAGE RIGHT FOOT WOUND WITH APPLICATION OF WOUND VAC;  Surgeon: Toni Arthurs, MD;  Location: [facility] OR;  Service: Orthopedics;  Laterality: Right;  . ORIF ULNAR FRACTURE Left 08/22/2016   Procedure: OPEN REDUCTION INTERNAL FIXATION (ORIF)  LEFT ULNA FRACTURE;  Surgeon: Tarry Kos, MD;  Location: [facility] OR;  Service: Orthopedics;   Laterality: Left;  . TUBAL LIGATION  2002  . tubal reversal     02/2014  . WISDOM TOOTH EXTRACTION  2002    OB History    No data available       Home Medications    Prior to Admission medications   Medication Sig Start Date End Date Taking? Authorizing Provider  hydrOXYzine (ATARAX/VISTARIL) 50 MG tablet Take 1 tablet (50 mg total) by mouth at bedtime as needed (sleep). Patient not taking: Reported on 08/19/2017 10/22/16   Beau Fanny, FNP  lamoTRIgine (LAMICTAL) 100 MG tablet Take 1 tablet (100 mg total) by mouth 2 (two) times daily. Patient not taking: Reported on 08/19/2017 10/22/16   Beau Fanny, FNP  naltrexone (DEPADE) 50 MG tablet Take 0.5 tablets (25 mg total) by mouth daily. Patient not taking: Reported on 01/24/2017 10/23/16   Withrow, Everardo All, FNP  nicotine (NICODERM CQ - DOSED IN MG/24 HOURS) 21 mg/24hr patch Place 1 patch (21 mg total) onto the skin daily. Patient not taking: Reported on 01/24/2017 10/23/16   Beau Fanny, FNP  ondansetron (ZOFRAN ODT) 4 MG disintegrating tablet Take 1 tablet (4 mg total) by mouth every 8 (eight) hours as needed for nausea or vomiting. 08/19/17   Ranell Skibinski, PA-C  prazosin (MINIPRESS) 2 MG capsule Take 2 capsules (4 mg total) by mouth at bedtime. Patient not taking: Reported on 08/19/2017 10/22/16   Beau Fanny, FNP  thiamine 100 MG tablet Take  1 tablet (100 mg total) by mouth daily. Patient not taking: Reported on 01/24/2017 10/23/16   Beau Fanny, FNP  traZODone (DESYREL) 50 MG tablet Take 1 tablet (50 mg total) by mouth at bedtime as needed for sleep. Patient not taking: Reported on 01/24/2017 10/22/16   Beau Fanny, FNP  venlafaxine XR (EFFEXOR-XR) 150 MG 24 hr capsule Take 1 capsule (150 mg total) by mouth daily. With one 37.5mg  capsule Patient not taking: Reported on 08/19/2017 10/23/16   Beau Fanny, FNP  venlafaxine XR (EFFEXOR-XR) 37.5 MG 24 hr capsule Take 1 capsule (37.5 mg total) by mouth daily with breakfast.  With one 150mg  capsule Patient not taking: Reported on 08/19/2017 10/23/16   Beau Fanny, FNP    Family History Family History  Problem Relation Age of Onset  . Hypertension Mother   . Hypertension Father   . Heart attack Father   . Diabetes Maternal Grandmother     Social History Social History  Substance Use Topics  . Smoking status: Current Every Day Smoker    Packs/day: 1.00    Years: 21.00    Types: Cigarettes  . Smokeless tobacco: Never Used  . Alcohol use Yes     Comment: OCC     Allergies   Latex; Penicillins; Codeine; Sulfa antibiotics; Morphine and related; and Tape   Review of Systems Review of Systems  Constitutional: Positive for appetite change, chills and fatigue. Negative for fever.  HENT: Negative for ear pain, rhinorrhea, sneezing and sore throat.   Eyes: Negative for photophobia and visual disturbance.  Respiratory: Negative for cough, chest tightness, shortness of breath and wheezing.   Cardiovascular: Negative for chest pain and palpitations.  Gastrointestinal: Positive for abdominal pain, constipation, nausea and vomiting. Negative for blood in stool and diarrhea.  Genitourinary: Negative for dysuria, hematuria and urgency.  Musculoskeletal: Negative for myalgias.  Skin: Negative for rash.  Neurological: Positive for dizziness. Negative for weakness, light-headedness and numbness.     Physical Exam Updated Vital Signs BP 135/86 (BP Location: Left Arm)   Pulse 86   Temp 98.4 F (36.9 C) (Oral)   Resp 18   LMP 08/17/2017   SpO2 99%   Physical Exam  Constitutional: She appears well-developed and well-nourished. No distress.  Appears uncomfortable.  HENT:  Head: Normocephalic and atraumatic.  Nose: Nose normal.  Eyes: Conjunctivae and EOM are normal. Right eye exhibits no discharge. Left eye exhibits no discharge. No scleral icterus.  Neck: Normal range of motion. Neck supple.  Cardiovascular: Normal rate, regular rhythm, normal  heart sounds and intact distal pulses.  Exam reveals no gallop and no friction rub.   No murmur heard. Pulmonary/Chest: Effort normal and breath sounds normal. No respiratory distress.  Abdominal: Soft. Bowel sounds are normal. She exhibits no distension. There is tenderness (Right lower quadrant). There is no rebound and no guarding.  Musculoskeletal: Normal range of motion. She exhibits no edema.  Neurological: She is alert. She exhibits normal muscle tone. Coordination normal.  Skin: Skin is warm and dry. No rash noted.  Psychiatric: She has a normal mood and affect.  Nursing note and vitals reviewed.    ED Treatments / Results  Labs (all labs ordered are listed, but only abnormal results are displayed) Labs Reviewed  COMPREHENSIVE METABOLIC PANEL - Abnormal; Notable for the following:       Result Value   Potassium 3.2 (*)    Chloride 93 (*)    BUN <5 (*)  AST 112 (*)    ALT 80 (*)    Total Bilirubin 1.3 (*)    All other components within normal limits  CBC - Abnormal; Notable for the following:    WBC 12.0 (*)    Hemoglobin 17.8 (*)    HCT 49.9 (*)    MCH 34.9 (*)    All other components within normal limits  URINALYSIS, ROUTINE W REFLEX MICROSCOPIC - Abnormal; Notable for the following:    Bilirubin Urine MODERATE (*)    Ketones, ur 80 (*)    Protein, ur 30 (*)    Bacteria, UA RARE (*)    Squamous Epithelial / LPF 0-5 (*)    All other components within normal limits  LIPASE, BLOOD  I-STAT BETA HCG BLOOD, ED ([facility], [facility], [facility] ONLY)    EKG  EKG Interpretation None       Radiology Ct Abdomen Pelvis W Contrast  Result Date: 08/19/2017 CLINICAL DATA:  36 year old female with right abdominal and pelvic pain for 2 weeks. EXAM: CT ABDOMEN AND PELVIS WITH CONTRAST TECHNIQUE: Multidetector CT imaging of the abdomen and pelvis was performed using the standard protocol following bolus administration of intravenous contrast. CONTRAST:  ISOVUE-300 IOPAMIDOL (ISOVUE-300)  INJECTION 61% COMPARISON:  03/24/2010 CT. FINDINGS: Lower chest: No acute abnormality. Hepatobiliary: The liver and gallbladder are unremarkable. No biliary dilatation. Pancreas: Unremarkable Spleen: Unremarkable Adrenals/Urinary Tract: The kidneys, bladder and adrenal glands are unremarkable except for a left renal cyst. Stomach/Bowel: Equivocal mild wall thickening of small bowel loops in the mid abdomen noted. Stomach is within normal limits. Appendix appears normal. No evidence of distention or inflammatory changes. Vascular/Lymphatic: No significant vascular findings are present. No enlarged abdominal or pelvic lymph nodes. Reproductive: Uterus and bilateral adnexa are unremarkable. Other: No free fluid, abscess or pneumoperitoneum. Musculoskeletal: No acute or significant osseous findings. IMPRESSION: 1. Equivocal mild small bowel wall thickening in the mid abdomen -correlate with possible enteritis. No evidence of bowel obstruction. 2. No other significant abnormalities. Electronically Signed   By: Harmon Pier M.D.   On: 08/19/2017 21:08    Procedures Procedures (including critical care time)  Medications Ordered in ED Medications  iopamidol (ISOVUE-300) 61 % injection (not administered)  sodium chloride 0.9 % bolus 1,000 mL (0 mLs Intravenous Stopped 08/19/17 2052)  ondansetron (ZOFRAN) injection 4 mg (4 mg Intravenous Given 08/19/17 2008)  fentaNYL (SUBLIMAZE) injection 12.5 mcg (12.5 mcg Intravenous Given 08/19/17 2008)  potassium chloride SA (K-DUR,KLOR-CON) CR tablet 40 mEq (40 mEq Oral Given 08/19/17 2051)  iopamidol (ISOVUE-300) 61 % injection 100 mL (100 mLs Intravenous Contrast Given 08/19/17 2024)     Initial Impression / Assessment and Plan / ED Course  I have reviewed the triage vital signs and the nursing notes.  Pertinent labs & imaging results that were available during my care of the patient were reviewed by me and considered in my medical decision making (see chart for  details).     Patient presents to ED for evaluation of squeezing right lower quadrant abdominal pain, vomiting and diarrhea for the past 2 weeks. She was sent here from PCP for evaluation of possible appendicitis or ovarian cyst. On physical exam she is tender to palpation in the right lower quadrant. She appears uncomfortable. She is afebrile with no history of fever. She reports alcohol and tobacco use. CMP showed mild hypokalemia 3.2. Lipase unremarkable. CBC shows leukocytosis at 12.0. Urinalysis with evidence of dehydration. HCG negative. CT of the abdomen shows possible findings of enteritis,  no abnormalities in appendix or ovaries. Patient given oral potassium, fluids, fentanyl, Zofran. She reports improvement in symptoms with the above measures. Encouraged patient to take Zofran as needed for nausea. Advised to follow-up with PCP for further evaluation as soon as possible. Patient appears stable for discharge at this time. Strict return precautions given.  Final Clinical Impressions(s) / ED Diagnoses   Final diagnoses:  Enteritis  Right lower quadrant abdominal pain    New Prescriptions Discharge Medication List as of 08/19/2017  9:54 PM    START taking these medications   Details  ondansetron (ZOFRAN ODT) 4 MG disintegrating tablet Take 1 tablet (4 mg total) by mouth every 8 (eight) hours as needed for nausea or vomiting., Starting Mon 08/19/2017, Print         Idelle Leech, [place], PA-C 08/19/17 2259    Rolan Bucco, MD 08/19/17 2322

## 2017-08-19 NOTE — Discharge Instructions (Signed)
Please read attached information regarding your condition. Take Zofran as needed for nausea. Stay hydrated with increasing fluids and food intake. Follow-up with primary care provider for further evaluation. Return to ED for worsening pain, blood in stool, blood in vomit, loss of consciousness, severe abdominal pain

## 2017-08-19 NOTE — ED Notes (Signed)
Pt in Ct  

## 2017-12-13 ENCOUNTER — Emergency Department (HOSPITAL_COMMUNITY): Payer: Medicare Other

## 2017-12-13 ENCOUNTER — Other Ambulatory Visit: Payer: Self-pay

## 2017-12-13 ENCOUNTER — Encounter (HOSPITAL_COMMUNITY): Payer: Self-pay | Admitting: Emergency Medicine

## 2017-12-13 ENCOUNTER — Inpatient Hospital Stay (HOSPITAL_COMMUNITY)
Admission: EM | Admit: 2017-12-13 | Discharge: 2017-12-18 | DRG: 308 | Disposition: A | Discharge status: Home / Self Care | Payer: Medicare Other | Attending: Family Medicine | Admitting: Family Medicine

## 2017-12-13 DIAGNOSIS — Z885 Allergy status to narcotic agent status: Secondary | ICD-10-CM | POA: Diagnosis not present

## 2017-12-13 DIAGNOSIS — I4901 Ventricular fibrillation: Secondary | ICD-10-CM | POA: Diagnosis not present

## 2017-12-13 DIAGNOSIS — F1290 Cannabis use, unspecified, uncomplicated: Secondary | ICD-10-CM | POA: Diagnosis present

## 2017-12-13 DIAGNOSIS — R9431 Abnormal electrocardiogram [ECG] [EKG]: Secondary | ICD-10-CM | POA: Diagnosis not present

## 2017-12-13 DIAGNOSIS — I469 Cardiac arrest, cause unspecified: Secondary | ICD-10-CM

## 2017-12-13 DIAGNOSIS — Z881 Allergy status to other antibiotic agents status: Secondary | ICD-10-CM

## 2017-12-13 DIAGNOSIS — E876 Hypokalemia: Secondary | ICD-10-CM | POA: Diagnosis present

## 2017-12-13 DIAGNOSIS — J690 Pneumonitis due to inhalation of food and vomit: Secondary | ICD-10-CM | POA: Diagnosis present

## 2017-12-13 DIAGNOSIS — I4581 Long QT syndrome: Secondary | ICD-10-CM | POA: Diagnosis present

## 2017-12-13 DIAGNOSIS — R197 Diarrhea, unspecified: Secondary | ICD-10-CM | POA: Diagnosis present

## 2017-12-13 DIAGNOSIS — F1010 Alcohol abuse, uncomplicated: Secondary | ICD-10-CM | POA: Diagnosis not present

## 2017-12-13 DIAGNOSIS — F17210 Nicotine dependence, cigarettes, uncomplicated: Secondary | ICD-10-CM | POA: Diagnosis present

## 2017-12-13 DIAGNOSIS — F1210 Cannabis abuse, uncomplicated: Secondary | ICD-10-CM | POA: Diagnosis not present

## 2017-12-13 DIAGNOSIS — K209 Esophagitis, unspecified: Secondary | ICD-10-CM | POA: Diagnosis present

## 2017-12-13 DIAGNOSIS — E8729 Other acidosis: Secondary | ICD-10-CM

## 2017-12-13 DIAGNOSIS — E872 Acidosis: Secondary | ICD-10-CM | POA: Diagnosis present

## 2017-12-13 DIAGNOSIS — R569 Unspecified convulsions: Secondary | ICD-10-CM | POA: Diagnosis present

## 2017-12-13 DIAGNOSIS — F4310 Post-traumatic stress disorder, unspecified: Secondary | ICD-10-CM | POA: Diagnosis present

## 2017-12-13 DIAGNOSIS — K760 Fatty (change of) liver, not elsewhere classified: Secondary | ICD-10-CM | POA: Diagnosis present

## 2017-12-13 DIAGNOSIS — Z9114 Patient's other noncompliance with medication regimen: Secondary | ICD-10-CM

## 2017-12-13 DIAGNOSIS — R450 Nervousness: Secondary | ICD-10-CM | POA: Diagnosis not present

## 2017-12-13 DIAGNOSIS — I472 Ventricular tachycardia, unspecified: Secondary | ICD-10-CM

## 2017-12-13 DIAGNOSIS — Z681 Body mass index (BMI) 19 or less, adult: Secondary | ICD-10-CM | POA: Diagnosis not present

## 2017-12-13 DIAGNOSIS — Z8249 Family history of ischemic heart disease and other diseases of the circulatory system: Secondary | ICD-10-CM | POA: Diagnosis not present

## 2017-12-13 DIAGNOSIS — Z91040 Latex allergy status: Secondary | ICD-10-CM

## 2017-12-13 DIAGNOSIS — F419 Anxiety disorder, unspecified: Secondary | ICD-10-CM | POA: Diagnosis not present

## 2017-12-13 DIAGNOSIS — Z91048 Other nonmedicinal substance allergy status: Secondary | ICD-10-CM

## 2017-12-13 DIAGNOSIS — F1910 Other psychoactive substance abuse, uncomplicated: Secondary | ICD-10-CM | POA: Diagnosis not present

## 2017-12-13 DIAGNOSIS — E43 Unspecified severe protein-calorie malnutrition: Secondary | ICD-10-CM | POA: Diagnosis not present

## 2017-12-13 DIAGNOSIS — I462 Cardiac arrest due to underlying cardiac condition: Secondary | ICD-10-CM | POA: Diagnosis present

## 2017-12-13 DIAGNOSIS — Z716 Tobacco abuse counseling: Secondary | ICD-10-CM

## 2017-12-13 DIAGNOSIS — Z91410 Personal history of adult physical and sexual abuse: Secondary | ICD-10-CM

## 2017-12-13 DIAGNOSIS — F1020 Alcohol dependence, uncomplicated: Secondary | ICD-10-CM | POA: Diagnosis present

## 2017-12-13 DIAGNOSIS — R112 Nausea with vomiting, unspecified: Secondary | ICD-10-CM | POA: Diagnosis not present

## 2017-12-13 DIAGNOSIS — Z8659 Personal history of other mental and behavioral disorders: Secondary | ICD-10-CM

## 2017-12-13 DIAGNOSIS — Z880 Allergy status to penicillin: Secondary | ICD-10-CM

## 2017-12-13 DIAGNOSIS — Z91411 Personal history of adult psychological abuse: Secondary | ICD-10-CM

## 2017-12-13 DIAGNOSIS — F5000 Anorexia nervosa, unspecified: Secondary | ICD-10-CM | POA: Diagnosis not present

## 2017-12-13 DIAGNOSIS — K2970 Gastritis, unspecified, without bleeding: Secondary | ICD-10-CM | POA: Diagnosis present

## 2017-12-13 DIAGNOSIS — G4700 Insomnia, unspecified: Secondary | ICD-10-CM | POA: Diagnosis not present

## 2017-12-13 DIAGNOSIS — E871 Hypo-osmolality and hyponatremia: Secondary | ICD-10-CM | POA: Diagnosis present

## 2017-12-13 DIAGNOSIS — Z79899 Other long term (current) drug therapy: Secondary | ICD-10-CM

## 2017-12-13 DIAGNOSIS — E440 Moderate protein-calorie malnutrition: Secondary | ICD-10-CM | POA: Diagnosis present

## 2017-12-13 DIAGNOSIS — I10 Essential (primary) hypertension: Secondary | ICD-10-CM | POA: Diagnosis present

## 2017-12-13 DIAGNOSIS — F332 Major depressive disorder, recurrent severe without psychotic features: Secondary | ICD-10-CM | POA: Diagnosis present

## 2017-12-13 DIAGNOSIS — R109 Unspecified abdominal pain: Secondary | ICD-10-CM | POA: Diagnosis not present

## 2017-12-13 DIAGNOSIS — Z915 Personal history of self-harm: Secondary | ICD-10-CM

## 2017-12-13 DIAGNOSIS — I340 Nonrheumatic mitral (valve) insufficiency: Secondary | ICD-10-CM | POA: Diagnosis not present

## 2017-12-13 DIAGNOSIS — Z79891 Long term (current) use of opiate analgesic: Secondary | ICD-10-CM

## 2017-12-13 LAB — CBC
HCT: 48.4 % — ABNORMAL HIGH (ref 36.0–46.0)
Hemoglobin: 18.3 g/dL — ABNORMAL HIGH (ref 12.0–15.0)
MCH: 38 pg — AB (ref 26.0–34.0)
MCHC: 37.8 g/dL — ABNORMAL HIGH (ref 30.0–36.0)
MCV: 100.6 fL — ABNORMAL HIGH (ref 78.0–100.0)
PLATELETS: 191 10*3/uL (ref 150–400)
RBC: 4.81 MIL/uL (ref 3.87–5.11)
RDW: 14.2 % (ref 11.5–15.5)
WBC: 20.4 10*3/uL — AB (ref 4.0–10.5)

## 2017-12-13 LAB — I-STAT BETA HCG BLOOD, ED (MC, WL, AP ONLY)

## 2017-12-13 LAB — BASIC METABOLIC PANEL
ANION GAP: 12 (ref 5–15)
Anion gap: 9 (ref 5–15)
BUN: 6 mg/dL (ref 6–20)
BUN: 5 mg/dL — ABNORMAL LOW (ref 6–20)
CALCIUM: 7.1 mg/dL — AB (ref 8.9–10.3)
CO2: 23 mmol/L (ref 22–32)
CO2: 26 mmol/L (ref 22–32)
CREATININE: 0.75 mg/dL (ref 0.44–1.00)
Calcium: 7.1 mg/dL — ABNORMAL LOW (ref 8.9–10.3)
Chloride: 96 mmol/L — ABNORMAL LOW (ref 101–111)
Chloride: 94 mmol/L — ABNORMAL LOW (ref 101–111)
Creatinine, Ser: 0.82 mg/dL (ref 0.44–1.00)
GFR calc Af Amer: >60 mL/min (ref >60)
GFR calc non Af Amer: >60 mL/min (ref >60)
GLUCOSE: 68 mg/dL (ref 65–99)
Glucose, Bld: 104 mg/dL — ABNORMAL HIGH (ref 65–99)
Potassium: 2.8 mmol/L — ABNORMAL LOW (ref 3.5–5.1)
Potassium: 2.2 mmol/L — CL (ref 3.5–5.1)
SODIUM: 129 mmol/L — AB (ref 135–145)
Sodium: 131 mmol/L — ABNORMAL LOW (ref 135–145)

## 2017-12-13 LAB — COMPREHENSIVE METABOLIC PANEL
ALK PHOS: 61 U/L (ref 38–126)
ALT: 28 U/L (ref 14–54)
AST: 61 U/L — ABNORMAL HIGH (ref 15–41)
Albumin: 3.7 g/dL (ref 3.5–5.0)
Anion gap: 26 — ABNORMAL HIGH (ref 5–15)
BILIRUBIN TOTAL: 2.6 mg/dL — AB (ref 0.3–1.2)
BUN: 8 mg/dL (ref 6–20)
CALCIUM: 8.7 mg/dL — AB (ref 8.9–10.3)
CO2: 26 mmol/L (ref 22–32)
CREATININE: 1.32 mg/dL — AB (ref 0.44–1.00)
Chloride: 78 mmol/L — ABNORMAL LOW (ref 101–111)
GFR, EST AFRICAN AMERICAN: 59 mL/min — AB (ref >60)
GFR, EST NON AFRICAN AMERICAN: 51 mL/min — AB (ref >60)
Glucose, Bld: 136 mg/dL — ABNORMAL HIGH (ref 65–99)
Potassium: 2.5 mmol/L — CL (ref 3.5–5.1)
Sodium: 130 mmol/L — ABNORMAL LOW (ref 135–145)
TOTAL PROTEIN: 7 g/dL (ref 6.5–8.1)

## 2017-12-13 LAB — MRSA PCR SCREENING: MRSA BY PCR: NEGATIVE

## 2017-12-13 LAB — I-STAT VENOUS BLOOD GAS, ED
ACID-BASE DEFICIT: 15 mmol/L — AB (ref 0.0–2.0)
Bicarbonate: 12.4 mmol/L — ABNORMAL LOW (ref 20.0–28.0)
O2 SAT: 97 %
PO2 VEN: 117 mmHg — AB (ref 32.0–45.0)
TCO2: 13 mmol/L — ABNORMAL LOW (ref 22–32)
pCO2, Ven: 32.8 mmHg — ABNORMAL LOW (ref 44.0–60.0)
pH, Ven: 7.185 — CL (ref 7.250–7.430)

## 2017-12-13 LAB — ACETAMINOPHEN LEVEL

## 2017-12-13 LAB — VOLATILES,BLD-ACETONE,ETHANOL,ISOPROP,METHANOL
ACETONE, BLOOD: NEGATIVE % (ref 0.000–0.010)
Ethanol, blood: NEGATIVE % (ref 0.000–0.010)
Isopropanol, blood: NEGATIVE % (ref 0.000–0.010)
METHANOL, BLOOD: NEGATIVE % (ref 0.000–0.010)

## 2017-12-13 LAB — SALICYLATE LEVEL

## 2017-12-13 LAB — I-STAT TROPONIN, ED: Troponin i, poc: 0 ng/mL (ref 0.00–0.08)

## 2017-12-13 LAB — TSH: TSH: 1.23 u[IU]/mL (ref 0.350–4.500)

## 2017-12-13 LAB — MAGNESIUM
MAGNESIUM: 1 mg/dL — AB (ref 1.7–2.4)
MAGNESIUM: 2.1 mg/dL (ref 1.7–2.4)
MAGNESIUM: 1.9 mg/dL (ref 1.7–2.4)

## 2017-12-13 LAB — I-STAT CG4 LACTIC ACID, ED

## 2017-12-13 LAB — POC OCCULT BLOOD, ED: FECAL OCCULT BLD: POSITIVE — AB

## 2017-12-13 LAB — LIPASE, BLOOD: Lipase: 21 U/L (ref 11–51)

## 2017-12-13 LAB — OSMOLALITY: OSMOLALITY: 286 mosm/kg (ref 275–295)

## 2017-12-13 MED ORDER — DIPHENHYDRAMINE HCL 50 MG/ML IJ SOLN
25.0000 mg | Freq: Once | INTRAMUSCULAR | Status: AC
  Administered 2017-12-13: 25 mg via INTRAVENOUS
  Filled 2017-12-13: qty 1

## 2017-12-13 MED ORDER — FOLIC ACID 1 MG PO TABS
1.0000 mg | ORAL_TABLET | Freq: Every day | ORAL | Status: DC
  Administered 2017-12-14 – 2017-12-18 (×5): 1 mg via ORAL
  Filled 2017-12-13 (×5): qty 1

## 2017-12-13 MED ORDER — VANCOMYCIN HCL IN DEXTROSE 750-5 MG/150ML-% IV SOLN: 750.0000 mg | Freq: Two times a day (BID) | INTRAVENOUS | Status: DC

## 2017-12-13 MED ORDER — LORAZEPAM 2 MG/ML IJ SOLN
1.0000 mg | Freq: Once | INTRAMUSCULAR | Status: AC
  Administered 2017-12-13: 1 mg via INTRAVENOUS
  Filled 2017-12-13: qty 1

## 2017-12-13 MED ORDER — NICOTINE 21 MG/24HR TD PT24
21.0000 mg | MEDICATED_PATCH | Freq: Every day | TRANSDERMAL | Status: DC
  Administered 2017-12-13 – 2017-12-18 (×6): 21 mg via TRANSDERMAL
  Filled 2017-12-13 (×6): qty 1

## 2017-12-13 MED ORDER — THIAMINE HCL 100 MG/ML IJ SOLN: 100.0000 mg | Freq: Every day | INTRAMUSCULAR | Status: DC

## 2017-12-13 MED ORDER — FAMOTIDINE IN NACL 20-0.9 MG/50ML-% IV SOLN
20.0000 mg | Freq: Once | INTRAVENOUS | Status: AC
  Administered 2017-12-13: 20 mg via INTRAVENOUS
  Filled 2017-12-13: qty 50

## 2017-12-13 MED ORDER — VANCOMYCIN HCL IN DEXTROSE 1-5 GM/200ML-% IV SOLN
1000.0000 mg | Freq: Once | INTRAVENOUS | Status: AC
  Administered 2017-12-13: 1000 mg via INTRAVENOUS
  Filled 2017-12-13: qty 200

## 2017-12-13 MED ORDER — LEVOFLOXACIN IN D5W 750 MG/150ML IV SOLN
750.0000 mg | INTRAVENOUS | Status: DC
Start: 2017-12-14 — End: 2017-12-13

## 2017-12-13 MED ORDER — ADULT MULTIVITAMIN W/MINERALS CH
1.0000 | ORAL_TABLET | Freq: Every day | ORAL | Status: DC
  Administered 2017-12-13 – 2017-12-18 (×6): 1 via ORAL
  Filled 2017-12-13 (×6): qty 1

## 2017-12-13 MED ORDER — SODIUM CHLORIDE 0.9 % IV SOLN
80.0000 mg | Freq: Once | INTRAVENOUS | Status: AC
  Administered 2017-12-13: 15:00:00 80 mg via INTRAVENOUS
  Filled 2017-12-13: qty 80

## 2017-12-13 MED ORDER — SODIUM CHLORIDE 0.9 % IV BOLUS (SEPSIS)
2000.0000 mL | Freq: Once | INTRAVENOUS | Status: AC
  Administered 2017-12-13: 2000 mL via INTRAVENOUS

## 2017-12-13 MED ORDER — VITAMIN B-1 100 MG PO TABS
100.0000 mg | ORAL_TABLET | Freq: Every day | ORAL | Status: DC
  Administered 2017-12-14 – 2017-12-18 (×5): 100 mg via ORAL
  Filled 2017-12-13 (×6): qty 1

## 2017-12-13 MED ORDER — PANTOPRAZOLE SODIUM 40 MG IV SOLR
8.0000 mg/h | INTRAVENOUS | Status: DC
Start: 2017-12-13 — End: 2017-12-14
  Administered 2017-12-13 – 2017-12-14 (×2): 8 mg/h via INTRAVENOUS
  Filled 2017-12-13 (×4): qty 80

## 2017-12-13 MED ORDER — PANTOPRAZOLE SODIUM 40 MG IV SOLR: 40.0000 mg | Freq: Two times a day (BID) | INTRAVENOUS | Status: DC

## 2017-12-13 MED ORDER — DEXTROSE-NACL 5-0.9 % IV SOLN: Freq: Once | INTRAVENOUS | Status: DC

## 2017-12-13 MED ORDER — SODIUM CHLORIDE 0.9 % IV SOLN
1.0000 mg | Freq: Once | INTRAVENOUS | Status: AC
  Administered 2017-12-13: 1 mg via INTRAVENOUS
  Filled 2017-12-13: qty 0.2

## 2017-12-13 MED ORDER — POTASSIUM CHLORIDE 10 MEQ/100ML IV SOLN
10.0000 meq | INTRAVENOUS | Status: AC
  Administered 2017-12-13 (×3): 10 meq via INTRAVENOUS
  Filled 2017-12-13 (×2): qty 100

## 2017-12-13 MED ORDER — METRONIDAZOLE IN NACL 5-0.79 MG/ML-% IV SOLN
500.0000 mg | Freq: Three times a day (TID) | INTRAVENOUS | Status: DC
  Filled 2017-12-13: qty 100

## 2017-12-13 MED ORDER — IOPAMIDOL (ISOVUE-300) INJECTION 61%
INTRAVENOUS | Status: AC
  Administered 2017-12-13: 100 mL
  Filled 2017-12-13: qty 100

## 2017-12-13 MED ORDER — ONDANSETRON HCL 4 MG/2ML IJ SOLN
4.0000 mg | Freq: Four times a day (QID) | INTRAMUSCULAR | Status: DC | PRN
  Administered 2017-12-14: 4 mg via INTRAVENOUS
  Filled 2017-12-13: qty 2

## 2017-12-13 MED ORDER — LEVOFLOXACIN IN D5W 750 MG/150ML IV SOLN
750.0000 mg | Freq: Once | INTRAVENOUS | Status: AC
  Administered 2017-12-13: 750 mg via INTRAVENOUS
  Filled 2017-12-13: qty 150

## 2017-12-13 MED ORDER — THIAMINE HCL 100 MG/ML IJ SOLN
100.0000 mg | Freq: Once | INTRAMUSCULAR | Status: AC
  Administered 2017-12-13: 100 mg via INTRAVENOUS
  Filled 2017-12-13: qty 2

## 2017-12-13 MED ORDER — LORAZEPAM 2 MG/ML IJ SOLN
1.0000 mg | Freq: Four times a day (QID) | INTRAMUSCULAR | Status: DC | PRN
  Administered 2017-12-14: 1 mg via INTRAVENOUS
  Filled 2017-12-13: qty 1

## 2017-12-13 MED ORDER — POTASSIUM CHLORIDE 10 MEQ/100ML IV SOLN
10.0000 meq | INTRAVENOUS | Status: AC
  Administered 2017-12-13 (×3): 10 meq via INTRAVENOUS
  Filled 2017-12-13 (×3): qty 100

## 2017-12-13 MED ORDER — MAGNESIUM SULFATE 2 GM/50ML IV SOLN
2.0000 g | Freq: Once | INTRAVENOUS | Status: AC
  Administered 2017-12-13: 2 g via INTRAVENOUS
  Filled 2017-12-13: qty 50

## 2017-12-13 MED ORDER — LORAZEPAM 1 MG PO TABS
1.0000 mg | ORAL_TABLET | Freq: Four times a day (QID) | ORAL | Status: DC | PRN
Start: 2017-12-13 — End: 2017-12-15
  Administered 2017-12-14 – 2017-12-15 (×2): 1 mg via ORAL
  Filled 2017-12-13 (×2): qty 1

## 2017-12-13 MED ORDER — PROCHLORPERAZINE EDISYLATE 5 MG/ML IJ SOLN
10.0000 mg | Freq: Once | INTRAMUSCULAR | Status: AC
  Administered 2017-12-13: 10 mg via INTRAVENOUS
  Filled 2017-12-13: qty 2

## 2017-12-13 MED ORDER — DEXTROSE 5 % IV SOLN
2.0000 g | Freq: Three times a day (TID) | INTRAVENOUS | Status: DC
  Administered 2017-12-13 – 2017-12-14 (×2): 2 g via INTRAVENOUS
  Filled 2017-12-13 (×3): qty 2

## 2017-12-13 MED ORDER — HYDROCODONE-ACETAMINOPHEN 5-325 MG PO TABS
1.0000 | ORAL_TABLET | Freq: Four times a day (QID) | ORAL | Status: DC | PRN
  Administered 2017-12-13: 2 via ORAL
  Administered 2017-12-14: 1 via ORAL
  Administered 2017-12-14: 2 via ORAL
  Filled 2017-12-13 (×2): qty 2
  Filled 2017-12-13: qty 1

## 2017-12-13 MED FILL — Medication: Qty: 1 | Status: CN

## 2017-12-13 MED FILL — Medication: Qty: 1 | Status: AC

## 2017-12-13 NOTE — ED Provider Notes (Signed)
[facility] Provider Note   CSN: [id number] Arrival date & time: 12/13/17  [id number]     History   Chief Complaint Chief Complaint  Patient presents with  . Abdominal Pain  . Emesis   HPI   Blood pressure (!) 130/100, pulse 61, temperature 97.8 F (36.6 C), temperature source Oral, resp. rate 20, height 5\' 5"  (1.651 m), weight 54.4 kg (120 lb), SpO2 100 %.  Tonna BoehringerJenny B Hindes is a 36 y.o. female with past medical history significant for anorexia nervosa, PTSD and hypertension complaining of severe abdominal pain with multiple episodes of nonbloody, +bilious, non-coffee-ground emesis onset 4 days ago.  She states that she cannot keep any fluids down.  She endorses tactile fever and chills, melanotic diarrhea, she is passing flatus normally.  No sick contacts.  Her boyfriend who accompanies her states that this happens approximately once per month since May.  She does smoke marijuana occasionally and drinks alcohol daily, she drinks liquor and last EtOH intake was x2 days because she has been vomiting.  She has no history of EtOH withdrawal seizures or DTs.  She does not have any known liver dysfunction and does not see GI regularly.   Patient denies any intentional overdose including salicylate or acetaminophen, she denies drinking any non-ethanol in an attempt to intoxicate herself or commit suicide.  Patient states that she does have a primary care doctor but cannot remember their name, [place] [address]  Past Medical History:  Diagnosis Date  . Anorexia   . Anorexia nervosa   . Anxiety   . Depression   . 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 Gallstones   . Headache   . Hypertension   . PTSD (post-traumatic stress disorder)     Patient Active Problem List   Diagnosis Date Noted  . Left forearm pain 01/24/2017  . Alcohol abuse 10/17/2016  . 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 MDD (major depressive disorder), recurrent episode, severe (HCC) 10/17/2016  . Postoperative wound dehiscence 10/05/2014    Past Surgical History:  Procedure Laterality Date  . CHALAZION EXCISION     2012 LEFT EYE  . FOOT SURGERY     RIGHT   . INCISION AND DRAINAGE Right 10/05/2014   Procedure: INCISION AND DRAINAGE RIGHT FOOT WOUND WITH APPLICATION OF WOUND VAC;  Surgeon: Toni ArthursJohn Hewitt, MD;  Location: [facility] OR;  Service: Orthopedics;  Laterality: Right;  . ORIF ULNAR FRACTURE Left 08/22/2016   Procedure: OPEN REDUCTION INTERNAL FIXATION (ORIF)  LEFT ULNA FRACTURE;  Surgeon: Tarry KosNaiping M Xu, MD;  Location: [facility] OR;  Service: Orthopedics;  Laterality: Left;  . TUBAL LIGATION  2002  . tubal reversal     02/2014  . WISDOM TOOTH EXTRACTION  2002    OB History    No data available       Home Medications    Prior to Admission medications   Medication Sig Start Date End Date Taking? Authorizing Provider  hydrOXYzine (ATARAX/VISTARIL) 50 MG tablet Take 1 tablet (50 mg total) by mouth at bedtime as needed (sleep). Patient not taking: Reported on 08/19/2017 10/22/16   Beau FannyWithrow, John C, FNP  lamoTRIgine (LAMICTAL) 100 MG tablet Take 1 tablet (100 mg total) by mouth 2 (two) times daily. Patient not taking: Reported on 08/19/2017 10/22/16   Beau FannyWithrow, John C, FNP  naltrexone (DEPADE) 50 MG tablet Take 0.5 tablets (25 mg  total) by mouth daily. Patient not taking: Reported on 01/24/2017 10/23/16   Withrow, Everardo All, FNP  nicotine (NICODERM CQ - DOSED IN MG/24 HOURS) 21 mg/24hr patch Place 1 patch (21 mg total) onto the skin daily. Patient not taking: Reported on 01/24/2017 10/23/16   Beau Fanny, FNP  ondansetron (ZOFRAN ODT) 4 MG disintegrating tablet Take 1 tablet (4 mg total) by mouth every 8 (eight) hours as needed for nausea or vomiting. 08/19/17   Khatri, Hina, PA-C  prazosin (MINIPRESS) 2 MG capsule Take 2 capsules (4 mg total) by mouth at bedtime. Patient not  taking: Reported on 08/19/2017 10/22/16   Beau Fanny, FNP  thiamine 100 MG tablet Take 1 tablet (100 mg total) by mouth daily. Patient not taking: Reported on 01/24/2017 10/23/16   Beau Fanny, FNP  traZODone (DESYREL) 50 MG tablet Take 1 tablet (50 mg total) by mouth at bedtime as needed for sleep. Patient not taking: Reported on 01/24/2017 10/22/16   Beau Fanny, FNP  venlafaxine XR (EFFEXOR-XR) 150 MG 24 hr capsule Take 1 capsule (150 mg total) by mouth daily. With one 37.5mg  capsule Patient not taking: Reported on 08/19/2017 10/23/16   Beau Fanny, FNP  venlafaxine XR (EFFEXOR-XR) 37.5 MG 24 hr capsule Take 1 capsule (37.5 mg total) by mouth daily with breakfast. With one 150mg  capsule Patient not taking: Reported on 08/19/2017 10/23/16   Beau Fanny, FNP    Family History Family History  Problem Relation Age of Onset  . Hypertension Mother   . Hypertension Father   . Heart attack Father   . Diabetes Maternal Grandmother     Social History Social History   Tobacco Use  . Smoking status: Current Every Day Smoker    Packs/day: 1.00    Years: 21.00    Pack years: 21.00    Types: Cigarettes  . Smokeless tobacco: Never Used  Substance Use Topics  . Alcohol use: Yes    Comment: OCC  . Drug use: Yes    Types: Marijuana    Comment: daily     Allergies   Latex; Penicillins; Codeine; Sulfa antibiotics; Morphine and related; and Tape   Review of Systems Review of Systems  A complete review of systems was obtained and all systems are negative except as noted in the HPI and PMH.   Physical Exam Updated Vital Signs BP 103/72   Pulse 95   Temp 97.8 F (36.6 C) (Oral)   Resp (!) 21   Ht 5\' 5"  (1.651 m)   Wt 54.4 kg (120 lb)   LMP 12/10/2017   SpO2 100%   BMI 19.97 kg/m   Physical Exam  Constitutional: She is oriented to person, place, and time. No distress.  Poorly nourished, appears acutely uncomfortable  HENT:  Head: Normocephalic and atraumatic.    Dry mucous membranes, tongue fasciculation  Edentulous, facial tattoos  Eyes: Conjunctivae and EOM are normal. Pupils are equal, round, and reactive to light.  Neck: Normal range of motion.  Cardiovascular: Regular rhythm and intact distal pulses.  Mild tachycardia  Pulmonary/Chest: Effort normal and breath sounds normal.  Abdominal: She exhibits no distension. There is tenderness. There is guarding.  Hypoactive bowel sounds, diffusely exquisitely tender to light palpation with voluntary guarding, no distention, no significant rebound.  Musculoskeletal: Normal range of motion.  Neurological: She is alert and oriented to person, place, and time.  Skin: Capillary refill takes less than 2 seconds. She is not diaphoretic.  Psychiatric: She  has a normal mood and affect.  Nursing note and vitals reviewed.    ED Treatments / Results  Labs (all labs ordered are listed, but only abnormal results are displayed) Labs Reviewed  COMPREHENSIVE METABOLIC PANEL - Abnormal; Notable for the following components:      Result Value   Sodium 130 (*)    Potassium 2.5 (*)    Chloride 78 (*)    Glucose, Bld 136 (*)    Creatinine, Ser 1.32 (*)    Calcium 8.7 (*)    AST 61 (*)    Total Bilirubin 2.6 (*)    GFR calc non Af Amer 51 (*)    GFR calc Af Amer 59 (*)    Anion gap 26 (*)    All other components within normal limits  CBC - Abnormal; Notable for the following components:   WBC 20.4 (*)    Hemoglobin 18.3 (*)    HCT 48.4 (*)    MCV 100.6 (*)    MCH 38.0 (*)    MCHC 37.8 (*)    All other components within normal limits  MAGNESIUM - Abnormal; Notable for the following components:   Magnesium 1.0 (*)    All other components within normal limits  ACETAMINOPHEN LEVEL - Abnormal; Notable for the following components:   Acetaminophen (Tylenol), Serum <10 (*)    All other components within normal limits  I-STAT CG4 LACTIC ACID, ED - Abnormal; Notable for the following components:   Lactic  Acid, Venous >17.00 (*)    All other components within normal limits  POC OCCULT BLOOD, ED - Abnormal; Notable for the following components:   Fecal Occult Bld POSITIVE (*)    All other components within normal limits  I-STAT VENOUS BLOOD GAS, ED - Abnormal; Notable for the following components:   pH, Ven 7.185 (*)    pCO2, Ven 32.8 (*)    pO2, Ven 117.0 (*)    Bicarbonate 12.4 (*)    TCO2 13 (*)    Acid-base deficit 15.0 (*)    All other components within normal limits  CULTURE, BLOOD (ROUTINE X 2)  CULTURE, BLOOD (ROUTINE X 2)  C DIFFICILE QUICK SCREEN W PCR REFLEX  LIPASE, BLOOD  SALICYLATE LEVEL  OSMOLALITY  URINALYSIS, ROUTINE W REFLEX MICROSCOPIC  RAPID URINE DRUG SCREEN, HOSP PERFORMED  OSMOLALITY, URINE  VOLATILES,BLD-ACETONE,ETHANOL,ISOPROP,METHANOL  I-STAT BETA HCG BLOOD, ED ([facility], [facility], [facility] ONLY)  I-STAT TROPONIN, ED  POC OCCULT BLOOD, ED  I-STAT CG4 LACTIC ACID, ED    EKG  EKG Interpretation  Date/Time:  Friday December 13 2017 13:02:23 EST Ventricular Rate:  105 PR Interval:    QRS Duration: 79 QT Interval:  438 QTC Calculation: 579 R Axis:   56 Text Interpretation:  Sinus tachycardia Atrial premature complexes Anteroseptal infarct, age indeterminate Prolonged QT interval Confirmed by Benjiman Core [phone]) on 12/13/2017 2:20:23 PM Also confirmed by Benjiman Core [phone]), editor Barbette Hair [phone])  on 12/13/2017 2:21:32 PM       Radiology Ct Head Wo Contrast  Result Date: 12/13/2017 CLINICAL DATA:  Seizure.  Alcohol abuse disorder. EXAM: CT HEAD WITHOUT CONTRAST TECHNIQUE: Contiguous axial images were obtained from the base of the skull through the vertex without intravenous contrast. COMPARISON:  None. FINDINGS: Brain: Premature for age cerebral and cerebellar atrophy. No evidence for acute infarction, hemorrhage, mass lesion, hydrocephalus, or extra-axial fluid. No definite white matter disease. Vascular:  Negative. Skull: Negative.  Sinuses/Orbits: Negative. Other: Negative. IMPRESSION: Premature for age atrophy.  No  acute intracranial findings. Electronically Signed   By: Elsie Stain M.D.   On: 12/13/2017 13:50   Ct Abdomen Pelvis W Contrast  Result Date: 12/13/2017 CLINICAL DATA:  Nausea and vomiting. Anorexia nervosa. Smoker. Alcohol abuse. EXAM: CT ABDOMEN AND PELVIS WITH CONTRAST TECHNIQUE: Multidetector CT imaging of the abdomen and pelvis was performed using the standard protocol following bolus administration of intravenous contrast. CONTRAST:  ISOVUE-300 IOPAMIDOL (ISOVUE-300) INJECTION 61% COMPARISON:  08/19/2017 FINDINGS: Lower chest: Interval multiple focal areas of patchy opacity in the left lower lobe. Hepatobiliary: Diffuse low density of the liver relative to the spleen without significant change. Unremarkable gallbladder. Pancreas: Unremarkable. No pancreatic ductal dilatation or surrounding inflammatory changes. Spleen: Normal in size without focal abnormality. Adrenals/Urinary Tract: Unremarkable adrenal glands. Stable left renal cyst. Unremarkable right kidney, ureters and urinary bladder. Stomach/Bowel: Stomach is within normal limits. Appendix appears normal. No evidence of bowel wall thickening, distention, or inflammatory changes. Vascular/Lymphatic: No significant vascular findings are present. No enlarged abdominal or pelvic lymph nodes. Reproductive: Uterus and bilateral adnexa are unremarkable. Tampon in the vagina. Other: Small umbilical hernia containing fat. Musculoskeletal: Mild degenerative changes at the L5-S1 level. IMPRESSION: 1. Interval multiple focal patchy opacities in the left lower lobe, suspicious for pneumonia or aspiration pneumonitis. 2. Stable diffuse hepatic steatosis. Electronically Signed   By: Beckie Salts M.D.   On: 12/13/2017 13:53   Dg Chest Port 1 View  Result Date: 12/13/2017 CLINICAL DATA:  Three-day history of generalized chest pain, nausea and vomiting, and diarrhea.  Metabolic acidosis. EXAM: PORTABLE CHEST 1 VIEW COMPARISON:  10/04/2014, 08/15/2009 and earlier. FINDINGS: External pacing pads are present. Cardiomediastinal silhouette unremarkable for AP portable technique, unchanged. Lungs clear. Bronchovascular markings normal. Pulmonary vascularity normal. No visible pleural effusions. No pneumothorax. IMPRESSION: No acute cardiopulmonary disease. Electronically Signed   By: Hulan Saas M.D.   On: 12/13/2017 14:07    Procedures Procedures (including critical care time)  CRITICAL CARE Performed by: Joni Reining Niyah Mamaril   Total critical care time: 65 minutes  Critical care time was exclusive of separately billable procedures and treating other patients.  Critical care was necessary to treat or prevent imminent or life-threatening deterioration.  Critical care was time spent personally by me on the following activities: development of treatment plan with patient and/or surrogate as well as nursing, discussions with consultants, evaluation of patient's response to treatment, examination of patient, obtaining history from patient or surrogate, ordering and performing treatments and interventions, ordering and review of laboratory studies, ordering and review of radiographic studies, pulse oximetry and re-evaluation of patient's condition.   Medications Ordered in ED Medications  potassium chloride 10 mEq in 100 mL IVPB (10 mEq Intravenous New Bag/Given 12/13/17 1453)  dextrose 5 %-0.9 % sodium chloride infusion (not administered)  thiamine (B-1) injection 100 mg (not administered)  pantoprazole (PROTONIX) 80 mg in sodium chloride 0.9 % 250 mL (0.32 mg/mL) infusion (8 mg/hr Intravenous New Bag/Given 12/13/17 1453)  pantoprazole (PROTONIX) injection 40 mg (not administered)  levofloxacin (LEVAQUIN) IVPB 750 mg (750 mg Intravenous New Bag/Given 12/13/17 1454)  vancomycin (VANCOCIN) IVPB 1000 mg/200 mL premix (1,000 mg Intravenous New Bag/Given 12/13/17  1454)  levofloxacin (LEVAQUIN) IVPB 750 mg (not administered)  vancomycin (VANCOCIN) IVPB 750 mg/150 ml premix (not administered)  prochlorperazine (COMPAZINE) injection 10 mg (10 mg Intravenous Given 12/13/17 1122)  diphenhydrAMINE (BENADRYL) injection 25 mg (25 mg Intravenous Given 12/13/17 1122)  sodium chloride 0.9 % bolus 2,000 mL (0 mLs Intravenous Stopped 12/13/17 1420)  famotidine (  PEPCID) IVPB 20 mg premix (0 mg Intravenous Stopped 12/13/17 1200)  magnesium sulfate IVPB 2 g 50 mL (0 g Intravenous Stopped 12/13/17 1309)  iopamidol (ISOVUE-300) 61 % injection (100 mLs  Contrast Given 12/13/17 1328)  LORazepam (ATIVAN) injection 1 mg (1 mg Intravenous Given 12/13/17 1235)  folic acid 1 mg in sodium chloride 0.9 % 50 mL IVPB (0 mg Intravenous Stopped 12/13/17 1351)  pantoprazole (PROTONIX) 80 mg in sodium chloride 0.9 % 100 mL IVPB (80 mg Intravenous New Bag/Given 12/13/17 1453)     Initial Impression / Assessment and Plan / ED Course  I have reviewed the triage vital signs and the nursing notes.  Pertinent labs & imaging results that were available during my care of the patient were reviewed by me and considered in my medical decision making (see chart for details).     Vitals:   12/13/17 1400 12/13/17 1415 12/13/17 1500 12/13/17 1530  BP: 98/72 118/79 110/74 103/72  Pulse: 92 93 95   Resp: (!) 26 (!) 24 19 (!) 21  Temp:      TempSrc:      SpO2: 97% 99% 100%   Weight:      Height:        Medications  potassium chloride 10 mEq in 100 mL IVPB (10 mEq Intravenous New Bag/Given 12/13/17 1453)  dextrose 5 %-0.9 % sodium chloride infusion (not administered)  thiamine (B-1) injection 100 mg (not administered)  pantoprazole (PROTONIX) 80 mg in sodium chloride 0.9 % 250 mL (0.32 mg/mL) infusion (8 mg/hr Intravenous New Bag/Given 12/13/17 1453)  pantoprazole (PROTONIX) injection 40 mg (not administered)  levofloxacin (LEVAQUIN) IVPB 750 mg (750 mg Intravenous New Bag/Given  12/13/17 1454)  vancomycin (VANCOCIN) IVPB 1000 mg/200 mL premix (1,000 mg Intravenous New Bag/Given 12/13/17 1454)  levofloxacin (LEVAQUIN) IVPB 750 mg (not administered)  vancomycin (VANCOCIN) IVPB 750 mg/150 ml premix (not administered)  prochlorperazine (COMPAZINE) injection 10 mg (10 mg Intravenous Given 12/13/17 1122)  diphenhydrAMINE (BENADRYL) injection 25 mg (25 mg Intravenous Given 12/13/17 1122)  sodium chloride 0.9 % bolus 2,000 mL (0 mLs Intravenous Stopped 12/13/17 1420)  famotidine (PEPCID) IVPB 20 mg premix (0 mg Intravenous Stopped 12/13/17 1200)  magnesium sulfate IVPB 2 g 50 mL (0 g Intravenous Stopped 12/13/17 1309)  iopamidol (ISOVUE-300) 61 % injection (100 mLs  Contrast Given 12/13/17 1328)  LORazepam (ATIVAN) injection 1 mg (1 mg Intravenous Given 12/13/17 1235)  folic acid 1 mg in sodium chloride 0.9 % 50 mL IVPB (0 mg Intravenous Stopped 12/13/17 1351)  pantoprazole (PROTONIX) 80 mg in sodium chloride 0.9 % 100 mL IVPB (80 mg Intravenous New Bag/Given 12/13/17 1453)    JERALYN NOLDEN is 36 y.o. female presenting with nausea vomiting diarrhea over the course of the last 4 days hypoactive bowel sounds, patient diffusely tender to palpation.  Patient afebrile, appears dehydrated.  This patient is an alcoholic with no history of DTs.  12:25 PM informed by nurse the patient is coding, patient is receiving magnesium and potassium.  CPR initiated, Dr. have aligned at bedside, monitor appears to have V. tach, patient shocked return of sinus rhythm.  I think the seizure was less likely related to alcohol withdrawal and more so with the electrolyte abnormalities.  Patient is receiving magnesium and potassium, she is now verbal, states that she feels short of breath, saturating well on room air, lung sounds remain clear to auscultation.   Per RN: patient states that her entire body clenched and her eyes deviated  to the left, this happened 2 times she initially became responsive  immediately, was not confused, the second time it happened she became cyanotic and pulses were not palpable, V. tach on monitor.  1 PM: She continues to improve, sinus tachycardia on the monitor.  She is verbally responsive and conversant now.  I think she is appropriate to go for CT, nurse noticed a right arm drift, will obtain head CT as well.   Lactic acid that was initially requested after anion gap was obtained has resulted at 17 however this was drawn after her seizure.  VBG with pH of 7.18, discussed with attending.   White count of 20, significantly hemoconcentrated with a hemoglobin of 18 and a crit of 48.  Guaiac positive, patient started on Protonix bolus and drip.  No anemia or reports of hematemesis, no anemia, doubt variceal bleed.  Discussed with critical care NP who declines primary admission given hemodynamic stability.  Abdomen pelvis CT without acute abnormality then you note some patchy opacities in the lung, patient will be started on a community-acquired pneumonia regimen of vancomycin and Levaquin.  Patient continues to clinically improve, vital signs normalizing.   Unassigned admission to [facility] hospitalist  Final Clinical Impressions(s) / ED Diagnoses   Final diagnoses:  Hypokalemia  High anion gap metabolic acidosis  Alcohol abuse  Nausea vomiting and diarrhea  Ventricular tachycardia (HCC)  Aspiration pneumonia, unspecified aspiration pneumonia type, unspecified laterality, unspecified part of lung [facility](HCC)    ED Discharge Orders    None       Kaylyn Limisciotta, Lakima Dona, PA-C 12/13/17 1551    Benjiman CorePickering, Nathan, MD 12/13/17 1554

## 2017-12-13 NOTE — Progress Notes (Signed)
Pharmacy Antibiotic Note  Gina Daniels is a 36 y.o. female admitted on 12/13/2017 with pneumonia.  Pharmacy has been consulted for Levaquin and vancomycin dosing. Patient presented with severe abdominal pain and has a history of Etoh intake and vomiting. LA > 17. WBC elevated at 20.4. SCr 1.32 (BL ~ 0.77). nCrCl ~ 60-65 mL/min.   Plan: -Levaquin 750 mg IV Q 24 hours -Vancomycin 1 gm IV load, followed by vancomycin 750 mg IV Q 12 hours -Monitor CBC, renal fx, cultures and clinical progress -VT at SS   Height: 5\' 5"  (165.1 cm) Weight: 120 lb (54.4 kg) IBW/kg (Calculated) : 57  Temp (24hrs), Avg:97.8 F (36.6 C), Min:97.8 F (36.6 C), Max:97.8 F (36.6 C)  Recent Labs  Lab 12/13/17 1004 12/13/17 1300  WBC 20.4*  --   CREATININE 1.32*  --   LATICACIDVEN  --  >17.00*    Estimated Creatinine Clearance: 50.1 mL/min (A) (by C-G formula based on SCr of 1.32 mg/dL (H)).    Allergies  Allergen Reactions  . Latex Anaphylaxis  . Penicillins Anaphylaxis    Has patient had a PCN reaction causing immediate rash, facial/tongue/throat swelling, SOB or lightheadedness with hypotension:  yes Has patient had a PCN reaction causing severe rash involving mucus membranes or skin necrosis: no Has patient had a PCN reaction that required hospitalization: yes Has patient had a PCN reaction occurring within the last 10 years: yes If all of the above answers are "NO", then may proceed with Cephalosporin use.   . Codeine Hives and Rash  . Sulfa Antibiotics Hives  . Morphine And Related Rash  . Tape Rash    Antimicrobials this admission: Merrem 12/21 >>  Vanc 12/21 >>   Dose adjustments this admission: None  Microbiology results: 12/21 BCx:   Thank you for allowing pharmacy to be a part of this patient's care.  Vinnie LevelBenjamin Shada Nienaber, PharmD., BCPS Clinical Pharmacist Pager [phone]

## 2017-12-13 NOTE — H&P (Addendum)
History and Physical  LILLIAHNA SCHUBRING NWG:[id number] DOB: 03-24-1981 DOA: 12/13/2017  Referring physician: EDP PCP: Truman Hayward, FNP   Chief Complaint: n/v, abnormal electrolyte, vfib arrest in the ED  HPI: Gina Daniels is a 36 y.o. female  With history of PTSD, anorexia, depression/ anxiety, she does not take any meds at home currently, she presentes to the emergency room complaining of nausea and vomiting with nonbloody, +bilious emesis,  abdominal pain.  She reports drink liquor every day, she quit drinking since Tuesday due to significant nausea and vomiting.    ED course:   Cbc WBC 20, potassium 2.5, magnesium 1, creatinine 1.3, total bili 2.6, ast 61. Abdomen pelvis CT without acute abnormality then you note some patchy opacities in the lung, she is given vancomycin and Levaquin in the ED. Guaiac positive, patient is started on Protonix bolus and drip in the ED. EKG obtained in the ED with prolonged QTc 620. While she is in the ED, she had cardiac arrest due to vtach /fib, she received cpr and s/p defib with RSOC. She is doing better, EDP discussed with critical care who declined admission.      Review of Systems:  Detail per HPI, Review of systems are otherwise negative  Past Medical History:  Diagnosis Date  . Anorexia   . Anorexia nervosa   . Anxiety   . Depression   . 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 Gallstones   . Headache   . Hypertension   . PTSD (post-traumatic stress disorder)    Past Surgical History:  Procedure Laterality Date  . CHALAZION EXCISION     2012 LEFT EYE  . FOOT SURGERY     RIGHT   . INCISION AND DRAINAGE Right 10/05/2014   Procedure: INCISION AND DRAINAGE RIGHT FOOT WOUND WITH APPLICATION OF WOUND VAC;  Surgeon: Toni Arthurs, MD;  Location: [facility] OR;  Service: Orthopedics;  Laterality: Right;  . ORIF ULNAR FRACTURE Left 08/22/2016   Procedure: OPEN REDUCTION INTERNAL FIXATION (ORIF)  LEFT ULNA FRACTURE;   Surgeon: Tarry Kos, MD;  Location: [facility] OR;  Service: Orthopedics;  Laterality: Left;  . TUBAL LIGATION  2002  . tubal reversal     02/2014  . WISDOM TOOTH EXTRACTION  2002   Social History:  reports that she has been smoking cigarettes.  She has a 21.00 pack-year smoking history. she has never used smokeless tobacco. She reports that she drinks alcohol. She reports that she uses drugs. Drug: Marijuana. Patient lives at home with husband & is able to participate in activities of daily living independently   Allergies  Allergen Reactions  . Latex Anaphylaxis  . Penicillins Anaphylaxis    Has patient had a PCN reaction causing immediate rash, facial/tongue/throat swelling, SOB or lightheadedness with hypotension:  yes Has patient had a PCN reaction causing severe rash involving mucus membranes or skin necrosis: no Has patient had a PCN reaction that required hospitalization: yes Has patient had a PCN reaction occurring within the last 10 years: yes If all of the above answers are "NO", then may proceed with Cephalosporin use.   . Codeine Hives and Rash  . Sulfa Antibiotics Hives  . Morphine And Related Rash  . Tape Rash    Family History  Problem Relation Age of Onset  . Hypertension Mother   . Hypertension Father   . Heart attack Father   . Diabetes Maternal Grandmother       Prior  to Admission medications   Medication Sig Start Date End Date Taking? Authorizing Provider  hydrOXYzine (ATARAX/VISTARIL) 50 MG tablet Take 1 tablet (50 mg total) by mouth at bedtime as needed (sleep). Patient not taking: Reported on 08/19/2017 10/22/16   Beau FannyWithrow, John C, FNP  lamoTRIgine (LAMICTAL) 100 MG tablet Take 1 tablet (100 mg total) by mouth 2 (two) times daily. Patient not taking: Reported on 08/19/2017 10/22/16   Beau FannyWithrow, John C, FNP  naltrexone (DEPADE) 50 MG tablet Take 0.5 tablets (25 mg total) by mouth daily. Patient not taking: Reported on 01/24/2017 10/23/16   Withrow, Everardo AllJohn C, FNP    nicotine (NICODERM CQ - DOSED IN MG/24 HOURS) 21 mg/24hr patch Place 1 patch (21 mg total) onto the skin daily. Patient not taking: Reported on 01/24/2017 10/23/16   Beau FannyWithrow, John C, FNP  ondansetron (ZOFRAN ODT) 4 MG disintegrating tablet Take 1 tablet (4 mg total) by mouth every 8 (eight) hours as needed for nausea or vomiting. 08/19/17   Khatri, Hina, PA-C  prazosin (MINIPRESS) 2 MG capsule Take 2 capsules (4 mg total) by mouth at bedtime. Patient not taking: Reported on 08/19/2017 10/22/16   Beau FannyWithrow, John C, FNP  thiamine 100 MG tablet Take 1 tablet (100 mg total) by mouth daily. Patient not taking: Reported on 01/24/2017 10/23/16   Beau FannyWithrow, John C, FNP  traZODone (DESYREL) 50 MG tablet Take 1 tablet (50 mg total) by mouth at bedtime as needed for sleep. Patient not taking: Reported on 01/24/2017 10/22/16   Beau FannyWithrow, John C, FNP  venlafaxine XR (EFFEXOR-XR) 150 MG 24 hr capsule Take 1 capsule (150 mg total) by mouth daily. With one 37.5mg  capsule Patient not taking: Reported on 08/19/2017 10/23/16   Beau FannyWithrow, John C, FNP  venlafaxine XR (EFFEXOR-XR) 37.5 MG 24 hr capsule Take 1 capsule (37.5 mg total) by mouth daily with breakfast. With one 150mg  capsule Patient not taking: Reported on 08/19/2017 10/23/16   Beau FannyWithrow, John C, FNP    Physical Exam: BP 103/72   Pulse 95   Temp 97.8 F (36.6 C) (Oral)   Resp (!) 21   Ht 5\' 5"  (1.651 m)   Wt 54.4 kg (120 lb)   LMP 12/10/2017   SpO2 100%   BMI 19.97 kg/m   General:  Thin, pale, she does not make eye contact during conversation, she is oriented x3 Eyes: PERRL ENT: unremarkable Neck: supple, no JVD Cardiovascular: RRR Respiratory: CTABL Abdomen: soft/NT/ND, positive bowel sounds Skin: no rash Musculoskeletal:  No edema Psychiatric: flat affect, she does not make eye contact during conversation Neurologic: no focal findings            Labs on Admission:  Basic Metabolic Panel: Recent Labs  Lab 12/13/17 1004  NA 130*  K 2.5*  CL 78*   CO2 26  GLUCOSE 136*  BUN 8  CREATININE 1.32*  CALCIUM 8.7*  MG 1.0*   Liver Function Tests: Recent Labs  Lab 12/13/17 1004  AST 61*  ALT 28  ALKPHOS 61  BILITOT 2.6*  PROT 7.0  ALBUMIN 3.7   Recent Labs  Lab 12/13/17 1004  LIPASE 21   No results for input(s): AMMONIA in the last 168 hours. CBC: Recent Labs  Lab 12/13/17 1004  WBC 20.4*  HGB 18.3*  HCT 48.4*  MCV 100.6*  PLT 191   Cardiac Enzymes: No results for input(s): CKTOTAL, CKMB, CKMBINDEX, TROPONINI in the last 168 hours.  BNP (last 3 results) No results for input(s): BNP in the last 8760  hours.  ProBNP (last 3 results) No results for input(s): PROBNP in the last 8760 hours.  CBG: No results for input(s): GLUCAP in the last 168 hours.  Radiological Exams on Admission: Ct Head Wo Contrast  Result Date: 12/13/2017 CLINICAL DATA:  Seizure.  Alcohol abuse disorder. EXAM: CT HEAD WITHOUT CONTRAST TECHNIQUE: Contiguous axial images were obtained from the base of the skull through the vertex without intravenous contrast. COMPARISON:  None. FINDINGS: Brain: Premature for age cerebral and cerebellar atrophy. No evidence for acute infarction, hemorrhage, mass lesion, hydrocephalus, or extra-axial fluid. No definite white matter disease. Vascular:  Negative. Skull: Negative. Sinuses/Orbits: Negative. Other: Negative. IMPRESSION: Premature for age atrophy.  No acute intracranial findings. Electronically Signed   By: Elsie Stain M.D.   On: 12/13/2017 13:50   Ct Abdomen Pelvis W Contrast  Result Date: 12/13/2017 CLINICAL DATA:  Nausea and vomiting. Anorexia nervosa. Smoker. Alcohol abuse. EXAM: CT ABDOMEN AND PELVIS WITH CONTRAST TECHNIQUE: Multidetector CT imaging of the abdomen and pelvis was performed using the standard protocol following bolus administration of intravenous contrast. CONTRAST:  ISOVUE-300 IOPAMIDOL (ISOVUE-300) INJECTION 61% COMPARISON:  08/19/2017 FINDINGS: Lower chest: Interval multiple  focal areas of patchy opacity in the left lower lobe. Hepatobiliary: Diffuse low density of the liver relative to the spleen without significant change. Unremarkable gallbladder. Pancreas: Unremarkable. No pancreatic ductal dilatation or surrounding inflammatory changes. Spleen: Normal in size without focal abnormality. Adrenals/Urinary Tract: Unremarkable adrenal glands. Stable left renal cyst. Unremarkable right kidney, ureters and urinary bladder. Stomach/Bowel: Stomach is within normal limits. Appendix appears normal. No evidence of bowel wall thickening, distention, or inflammatory changes. Vascular/Lymphatic: No significant vascular findings are present. No enlarged abdominal or pelvic lymph nodes. Reproductive: Uterus and bilateral adnexa are unremarkable. Tampon in the vagina. Other: Small umbilical hernia containing fat. Musculoskeletal: Mild degenerative changes at the L5-S1 level. IMPRESSION: 1. Interval multiple focal patchy opacities in the left lower lobe, suspicious for pneumonia or aspiration pneumonitis. 2. Stable diffuse hepatic steatosis. Electronically Signed   By: Beckie Salts M.D.   On: 12/13/2017 13:53   Dg Chest Port 1 View  Result Date: 12/13/2017 CLINICAL DATA:  Three-day history of generalized chest pain, nausea and vomiting, and diarrhea. Metabolic acidosis. EXAM: PORTABLE CHEST 1 VIEW COMPARISON:  10/04/2014, 08/15/2009 and earlier. FINDINGS: External pacing pads are present. Cardiomediastinal silhouette unremarkable for AP portable technique, unchanged. Lungs clear. Bronchovascular markings normal. Pulmonary vascularity normal. No visible pleural effusions. No pneumothorax. IMPRESSION: No acute cardiopulmonary disease. Electronically Signed   By: Hulan Saas M.D.   On: 12/13/2017 14:07     Assessment/Plan Present on Admission: **None**   witnessed vfib arrest in the ED , s/p cpr and defib with ROSC Critical care declined admission likely from Qtc prolongation, her  qtc is 620 Correct k/mag, avoid qtc prolonging agent, d/c levaquin, d/c pepcid.  Lactic acid elevated but is was checked after cpr.   N/V/D/abdominal pain:  CT ab/pel + fatty liver, bilateral pneumonia but no other acute findings. FOBT + , cdiff pending collection hgb stable,  Likely combination of esophagitis/gastritis in the setting chronic heavy alcohol use ( liquor daily), marijuana use Continue ppi/prn zofran, Monitor hgb uds pending collection. Clear liquid diet for now.  Hypomag/hypok: Replace mag/k  Aspiration pneumonia:  D/c vanc D/c levaquin (due to Qtc prolongation) h/o anaphylaxis reaction to pcn Case discussed with pharmacy, for now start aztreonam, transition to clindamycin if no diarrhea or c diff test negative.   Elevated lft with h/o alcohol use  Ct ab with fatty liver Will check hepatitis/hiv Repeat lft in am  Alcohol use, denies history of withdrawal symptoms Start Ciwa protocol  Smoking: nicotine patch provided  Malnutrition:  Body mass index is 19.97 kg/m. With history of anorexia Nutrition consult  History of PTSD anxiety/ depression, anorexia She denies suicidal ideation or homicidal ideation She does not take any of the meds listed on her home meds list.  DVT prophylaxis: scd's  Consultants:  EDP talked to critical care  Code Status: full   Family Communication:  Patient and husband at bedside  Disposition Plan: admit to stepdown  Time spent: 75mins  Albertine GratesFang Rhiann Boucher MD, PhD [facility] Pager [phone] If 7PM-7AM, please contact night-coverage at [email or web address], password [email or web address]

## 2017-12-13 NOTE — Progress Notes (Signed)
CRITICAL VALUE ALERT  Critical Value:  Potassium 2.2  Date & Time Notied:  12/13/17 23 23:01  Provider Notified: Dr Rana SnareBodenheimer  Orders Received/Actions taken: 3rd bag of potassium is infusing now and Dr is made aware.

## 2017-12-13 NOTE — ED Notes (Signed)
Admitting MD at the bedside.  

## 2017-12-13 NOTE — ED Notes (Signed)
Attempted Report x1.   

## 2017-12-13 NOTE — Progress Notes (Signed)
Pharmacy Antibiotic Note  Gina Daniels is a 36 y.o. female admitted on 12/13/2017 with aspiration pneumonia.  Pharmacy initially consulted for Levaquin and vancomycin dosing now transitioning to aztreonam and flagyl. Patient received one dose of vancomycin and Levaquin. Noted anaphylaxis to penicillin. Patient presented with severe abdominal pain and has a history of Etoh intake and vomiting. LA > 17. WBC elevated at 20.4. SCr 1.32 (BL ~ 0.77). nCrCl ~ 60-65 mL/min.   Plan: -Flagyl 500mg  IV q8 hours -Aztreonam 2G IV q8 hours -Monitor CBC, renal fx, cultures and clinical progress  Height: 5\' 5"  (165.1 cm) Weight: 120 lb (54.4 kg) IBW/kg (Calculated) : 57  Temp (24hrs), Avg:98.4 F (36.9 C), Min:97.8 F (36.6 C), Max:99 F (37.2 C)  Recent Labs  Lab 12/13/17 1004 12/13/17 1300  WBC 20.4*  --   CREATININE 1.32*  --   LATICACIDVEN  --  >17.00*    Estimated Creatinine Clearance: 50.1 mL/min (A) (by C-G formula based on SCr of 1.32 mg/dL (H)).    Allergies  Allergen Reactions  . Latex Anaphylaxis  . Penicillins Anaphylaxis    Has patient had a PCN reaction causing immediate rash, facial/tongue/throat swelling, SOB or lightheadedness with hypotension:  yes Has patient had a PCN reaction causing severe rash involving mucus membranes or skin necrosis: no Has patient had a PCN reaction that required hospitalization: yes Has patient had a PCN reaction occurring within the last 10 years: yes If all of the above answers are "NO", then may proceed with Cephalosporin use.   . Codeine Hives and Rash  . Sulfa Antibiotics Hives  . Morphine And Related Rash  . Tape Rash    Antimicrobials this admission: Flagyl 12/21 >>  Aztreonam 12/21 >>   Dose adjustments this admission: None  Microbiology results: 12/21 BCx:   Thank you for allowing pharmacy to be a part of this patient's care.  Ruben Imony Atiya Yera, PharmD Clinical Pharmacist 12/13/2017 5:24 PM

## 2017-12-13 NOTE — ED Triage Notes (Signed)
N/v/abd pain x 3 days

## 2017-12-14 LAB — BLOOD CULTURE ID PANEL (REFLEXED)
ACINETOBACTER BAUMANNII: NOT DETECTED
CANDIDA ALBICANS: NOT DETECTED
CANDIDA KRUSEI: NOT DETECTED
CANDIDA PARAPSILOSIS: NOT DETECTED
Candida glabrata: NOT DETECTED
Candida tropicalis: NOT DETECTED
ESCHERICHIA COLI: NOT DETECTED
Enterobacter cloacae complex: NOT DETECTED
Enterobacteriaceae species: NOT DETECTED
Enterococcus species: NOT DETECTED
Haemophilus influenzae: NOT DETECTED
KLEBSIELLA OXYTOCA: NOT DETECTED
Klebsiella pneumoniae: NOT DETECTED
Listeria monocytogenes: NOT DETECTED
Methicillin resistance: DETECTED — AB
Neisseria meningitidis: NOT DETECTED
PSEUDOMONAS AERUGINOSA: NOT DETECTED
Proteus species: NOT DETECTED
SERRATIA MARCESCENS: NOT DETECTED
STAPHYLOCOCCUS AUREUS BCID: NOT DETECTED
STREPTOCOCCUS PNEUMONIAE: NOT DETECTED
STREPTOCOCCUS PYOGENES: NOT DETECTED
Staphylococcus species: DETECTED — AB
Streptococcus agalactiae: NOT DETECTED
Streptococcus species: NOT DETECTED

## 2017-12-14 LAB — BASIC METABOLIC PANEL
Anion gap: 10 (ref 5–15)
Anion gap: 9 (ref 5–15)
BUN: 5 mg/dL — AB (ref 6–20)
CALCIUM: 7.2 mg/dL — AB (ref 8.9–10.3)
CHLORIDE: 98 mmol/L — AB (ref 101–111)
CO2: 26 mmol/L (ref 22–32)
CO2: 24 mmol/L (ref 22–32)
CREATININE: 0.78 mg/dL (ref 0.44–1.00)
Calcium: 7.4 mg/dL — ABNORMAL LOW (ref 8.9–10.3)
Chloride: 94 mmol/L — ABNORMAL LOW (ref 101–111)
Creatinine, Ser: 0.57 mg/dL (ref 0.44–1.00)
GFR calc Af Amer: >60 mL/min (ref >60)
GFR calc Af Amer: >60 mL/min (ref >60)
GFR calc non Af Amer: >60 mL/min (ref >60)
GLUCOSE: 76 mg/dL (ref 65–99)
GLUCOSE: 76 mg/dL (ref 65–99)
POTASSIUM: 2.6 mmol/L — AB (ref 3.5–5.1)
POTASSIUM: 2.2 mmol/L — AB (ref 3.5–5.1)
SODIUM: 130 mmol/L — AB (ref 135–145)
Sodium: 131 mmol/L — ABNORMAL LOW (ref 135–145)

## 2017-12-14 LAB — RAPID URINE DRUG SCREEN, HOSP PERFORMED
Amphetamines: NOT DETECTED
BENZODIAZEPINES: POSITIVE — AB
Barbiturates: NOT DETECTED
COCAINE: NOT DETECTED
OPIATES: POSITIVE — AB
Tetrahydrocannabinol: POSITIVE — AB

## 2017-12-14 LAB — MAGNESIUM: MAGNESIUM: 1.9 mg/dL (ref 1.7–2.4)

## 2017-12-14 LAB — HIV ANTIBODY (ROUTINE TESTING W REFLEX): HIV SCREEN 4TH GENERATION: NONREACTIVE

## 2017-12-14 MED ORDER — POTASSIUM CHLORIDE CRYS ER 20 MEQ PO TBCR
20.0000 meq | EXTENDED_RELEASE_TABLET | Freq: Every day | ORAL | Status: AC
  Administered 2017-12-14 – 2017-12-15 (×5): 20 meq via ORAL
  Filled 2017-12-14 (×5): qty 1

## 2017-12-14 MED ORDER — HYDROCODONE-ACETAMINOPHEN 5-325 MG PO TABS: 1.0000 | ORAL_TABLET | Freq: Four times a day (QID) | ORAL | Status: DC | PRN

## 2017-12-14 MED ORDER — ENSURE ENLIVE PO LIQD
237.0000 mL | Freq: Two times a day (BID) | ORAL | Status: DC
  Administered 2017-12-14 – 2017-12-18 (×4): 237 mL via ORAL

## 2017-12-14 MED ORDER — PANTOPRAZOLE SODIUM 40 MG PO TBEC
40.0000 mg | DELAYED_RELEASE_TABLET | Freq: Two times a day (BID) | ORAL | Status: DC
  Administered 2017-12-14 – 2017-12-18 (×8): 40 mg via ORAL
  Filled 2017-12-14 (×8): qty 1

## 2017-12-14 MED ORDER — LOPERAMIDE HCL 2 MG PO CAPS: 2.0000 mg | ORAL_CAPSULE | ORAL | Status: DC | PRN

## 2017-12-14 MED ORDER — VANCOMYCIN HCL IN DEXTROSE 750-5 MG/150ML-% IV SOLN
750.0000 mg | Freq: Two times a day (BID) | INTRAVENOUS | Status: DC
  Administered 2017-12-14: 750 mg via INTRAVENOUS
  Filled 2017-12-14 (×2): qty 150

## 2017-12-14 MED ORDER — POTASSIUM CHLORIDE 10 MEQ/100ML IV SOLN
10.0000 meq | INTRAVENOUS | Status: DC
  Administered 2017-12-14 (×3): 10 meq via INTRAVENOUS
  Filled 2017-12-14 (×5): qty 100

## 2017-12-14 MED ORDER — SODIUM CHLORIDE 0.9 % IV SOLN
INTRAVENOUS | Status: DC
  Administered 2017-12-14 – 2017-12-17 (×4): via INTRAVENOUS

## 2017-12-14 MED ORDER — ACETAMINOPHEN 500 MG PO TABS
500.0000 mg | ORAL_TABLET | Freq: Four times a day (QID) | ORAL | Status: DC | PRN
  Administered 2017-12-14 – 2017-12-18 (×6): 500 mg via ORAL
  Filled 2017-12-14 (×7): qty 1

## 2017-12-14 NOTE — Progress Notes (Signed)
PHARMACY - PHYSICIAN COMMUNICATION CRITICAL VALUE ALERT - BLOOD CULTURE IDENTIFICATION (BCID)  Results for orders placed or performed during the hospital encounter of 12/13/17  Blood Culture ID Panel (Reflexed) (Collected: 12/13/2017 12:47 PM)  Result Value Ref Range   Enterococcus species NOT DETECTED NOT DETECTED   Listeria monocytogenes NOT DETECTED NOT DETECTED   Staphylococcus species DETECTED (A) NOT DETECTED   Staphylococcus aureus NOT DETECTED NOT DETECTED   Methicillin resistance DETECTED (A) NOT DETECTED   Streptococcus species NOT DETECTED NOT DETECTED   Streptococcus agalactiae NOT DETECTED NOT DETECTED   Streptococcus pneumoniae NOT DETECTED NOT DETECTED   Streptococcus pyogenes NOT DETECTED NOT DETECTED   Acinetobacter baumannii NOT DETECTED NOT DETECTED   Enterobacteriaceae species NOT DETECTED NOT DETECTED   Enterobacter cloacae complex NOT DETECTED NOT DETECTED   Escherichia coli NOT DETECTED NOT DETECTED   Klebsiella oxytoca NOT DETECTED NOT DETECTED   Klebsiella pneumoniae NOT DETECTED NOT DETECTED   Proteus species NOT DETECTED NOT DETECTED   Serratia marcescens NOT DETECTED NOT DETECTED   Haemophilus influenzae NOT DETECTED NOT DETECTED   Neisseria meningitidis NOT DETECTED NOT DETECTED   Pseudomonas aeruginosa NOT DETECTED NOT DETECTED   Candida albicans NOT DETECTED NOT DETECTED   Candida glabrata NOT DETECTED NOT DETECTED   Candida krusei NOT DETECTED NOT DETECTED   Candida parapsilosis NOT DETECTED NOT DETECTED   Candida tropicalis NOT DETECTED NOT DETECTED    Name of physician (or Provider) Contacted: Bodenheimer ([facility])  Changes to prescribed antibiotics required: Add Vancomycin 750 mg IV q12h  Abran DukeLedford, Printice Hellmer 12/14/2017  6:54 AM

## 2017-12-14 NOTE — Progress Notes (Signed)
CRITICAL VALUE ALERT  Critical Value:  Potassium 2.2  Date & Time Notied:  12/14/17 01:49  Provider Notified: Dr Rana SnareBodenheimer  Orders Received/Actions taken: Awaiting call back.

## 2017-12-14 NOTE — Progress Notes (Signed)
[place] TEAM 1 - Stepdown/ICU TEAM  Gina Daniels  ZOX:[id number] DOB: 1981/02/28 DOA: 12/13/2017 PCP: Truman HaywardStarkes, Takia S, FNP    Brief Narrative:  36yo F with a history of PTSD, anorexia, depression/anxiety who presented to the ED w/ nausea and vomiting with nonbloody,+ bilious emesis.  She reported drinking liquor every day, but quit 3-4 days prior to this admit due to significant nausea and vomiting.   In the ED she was found to have a WBC 20, potassium 2.5, magnesium 1, creatinine 1.3, total bili 2.6, AST 61.  CT Abd/pelvis without acute abdominal abnormality but w/ patchy opacities in the lungs.  She was Guaiac positive, and started on Protonix bolus and drip in the ED.  EKG in the ED noted prolonged QTc 620. While she is in the ED, she had cardiac arrest due to Vtach/Afib, she received cpr and s/p defib with RSOC. She is doing better, EDP discussed with critical care who declined admission.  Significant Events: 12/21 - admit - Vfib arrest in ED  Subjective: The patient is resting comfortably at this time.  She does report some chest pain related to her CPR yesterday.  She reports ongoing nausea but no vomiting at this time.  She admits to having watery diarrhea and states this is been occurring since March 2018.  She reports frequent episodes lasting a week with a week or 2 off and then recurrence of nausea vomiting and diarrhea.  She admits to drinking 6-8 shots of liquor every evening but does not drink throughout the day.  Assessment & Plan:  Vfib arrest in the ED - CPR and defib > ROSC Due to severe electrolyte abnormalities, prolonged QTc, and dosing of QT prolonging meds (Levaquin) - correct electrolyte derangements - avoid QT prolonging meds - f/u QT once above corrected - must stay on tele at all times for now   N/V/D/abdominal pain - Guaiac + CT ab/pel + fatty liver but no acute findings - Hgb stable - likely EtOH related esophagitis/gastritis - if sx persist may require  EGD - cont PPI and carafate - can change to oral PPI as there is not evidence of severe ongoing UGIB   Severe hypokalemia  Due to poor intake/alcoholism + GI losses - replace and follow   Severe hypomagnesemia Due to poor intake/alcoholism + GI losses - corrected for now - follow daily for 3-4 days to assure remains stable  Hyponatremia Due to EtOH abuse as well as volume depletion - follow trend   Prolonged QTc likely due to above - correct lytes and then re-eval QT - if remains prolonged after lytes corrected will need Cards/EP eval   Aspiration pneumonitis No signs/symptoms to suggest active acute infection - stop abx - follow clinically   Gram + cocci in 1 of 1 blood cx Unfortunately it appears only 1 blood cx was obtained - this is suspicious for contamination, but pt is also at risk of potential bacteremia - stop all abx for now and follow for speciation and sensitivities to guide need for further eval   Transaminitis - Fatty liver  Due to EtOH abuse - discussed need to abstain - check viral hepatitis panel   Alcoholism  Discussed need to stop drinking - pt seems interested - CSW to educate on community resources   Smoking Advised to stop smoking  Malnutrition - underweight - Body mass index is 19.97 kg/m. Nutrition consult  PTSD anxiety/ depression, anorexia denies suicidal ideation or homicidal ideation  DVT prophylaxis: SCDs  Code Status: FULL CODE Family Communication: no family present at time of exam  Disposition Plan: SDU  Consultants:  none  Antimicrobials:  Aztreonam 12/21 > 12/22 Vanc 12/21 > 12/22  Objective: Blood pressure (!) 153/95, pulse 88, temperature 98.9 F (37.2 C), temperature source Oral, resp. rate 15, height 5\' 5"  (1.651 m), weight 54.4 kg (120 lb), last menstrual period 12/10/2017, SpO2 100 %.  Intake/Output Summary (Last 24 hours) at 12/14/2017 1058 Last data filed at 12/14/2017 0953 Gross per 24 hour  Intake 2850 ml  Output -   Net 2850 ml   Filed Weights   12/13/17 1003  Weight: 54.4 kg (120 lb)    Examination: General: No acute respiratory distress Lungs: Clear to auscultation bilaterally without wheezes or crackles Cardiovascular: Regular rate and rhythm without murmur gallop or rub normal S1 and S2 Abdomen: Nontender, nondistended, soft, bowel sounds positive, no rebound, no ascites, no appreciable mass Extremities: No significant cyanosis, clubbing, or edema bilateral lower extremities  CBC: Recent Labs  Lab 12/13/17 1004  WBC 20.4*  HGB 18.3*  HCT 48.4*  MCV 100.6*  PLT 191   Basic Metabolic Panel: Recent Labs  Lab 12/13/17 1004 12/13/17 1819 12/13/17 2148 12/14/17 0057  NA 130* 131* 129* 130*  K 2.5* 2.8* 2.2* 2.2*  CL 78* 96* 94* 94*  CO2 26 23 26 26   GLUCOSE 136* 68 104* 76  BUN 8 6 5* 5*  CREATININE 1.32* 0.82 0.75 0.78  CALCIUM 8.7* 7.1* 7.1* 7.2*  MG 1.0* 2.1 1.9 1.9   GFR: Estimated Creatinine Clearance: 82.7 mL/min (by C-G formula based on SCr of 0.78 mg/dL).  Liver Function Tests: Recent Labs  Lab 12/13/17 1004  AST 61*  ALT 28  ALKPHOS 61  BILITOT 2.6*  PROT 7.0  ALBUMIN 3.7   Recent Labs  Lab 12/13/17 1004  LIPASE 21    Recent Results (from the past 240 hour(s))  Blood culture (routine x 2)     Status: None (Preliminary result)   Collection Time: 12/13/17 12:47 PM  Result Value Ref Range Status   Specimen Description BLOOD LEFT WRIST  Final   Special Requests   Final    BOTTLES DRAWN AEROBIC AND ANAEROBIC Blood Culture adequate volume   Culture  Setup Time   Final    GRAM POSITIVE COCCI IN BOTH AEROBIC AND ANAEROBIC BOTTLES CRITICAL RESULT CALLED TO, READ BACK BY AND VERIFIED WITH: J LEDFORD PHARMD [id number] 12/14/17 A BROWNING    Culture GRAM POSITIVE COCCI  Final   Report Status PENDING  Incomplete  Blood Culture ID Panel (Reflexed)     Status: Abnormal   Collection Time: 12/13/17 12:47 PM  Result Value Ref Range Status   Enterococcus species NOT  DETECTED NOT DETECTED Final   Listeria monocytogenes NOT DETECTED NOT DETECTED Final   Staphylococcus species DETECTED (A) NOT DETECTED Final    Comment: Methicillin (oxacillin) resistant coagulase negative staphylococcus. Possible blood culture contaminant (unless isolated from more than one blood culture draw or clinical case suggests pathogenicity). No antibiotic treatment is indicated for blood  culture contaminants. CRITICAL RESULT CALLED TO, READ BACK BY AND VERIFIED WITH: J LEDFORD PHARMD [id number] 12/14/17 A BROWNING    Staphylococcus aureus NOT DETECTED NOT DETECTED Final   Methicillin resistance DETECTED (A) NOT DETECTED Final    Comment: CRITICAL RESULT CALLED TO, READ BACK BY AND VERIFIED WITH: J LEDFORD PHARMD [id number] 12/14/17 A BROWNING    Streptococcus species NOT DETECTED NOT DETECTED Final   Streptococcus agalactiae  NOT DETECTED NOT DETECTED Final   Streptococcus pneumoniae NOT DETECTED NOT DETECTED Final   Streptococcus pyogenes NOT DETECTED NOT DETECTED Final   Acinetobacter baumannii NOT DETECTED NOT DETECTED Final   Enterobacteriaceae species NOT DETECTED NOT DETECTED Final   Enterobacter cloacae complex NOT DETECTED NOT DETECTED Final   Escherichia coli NOT DETECTED NOT DETECTED Final   Klebsiella oxytoca NOT DETECTED NOT DETECTED Final   Klebsiella pneumoniae NOT DETECTED NOT DETECTED Final   Proteus species NOT DETECTED NOT DETECTED Final   Serratia marcescens NOT DETECTED NOT DETECTED Final   Haemophilus influenzae NOT DETECTED NOT DETECTED Final   Neisseria meningitidis NOT DETECTED NOT DETECTED Final   Pseudomonas aeruginosa NOT DETECTED NOT DETECTED Final   Candida albicans NOT DETECTED NOT DETECTED Final   Candida glabrata NOT DETECTED NOT DETECTED Final   Candida krusei NOT DETECTED NOT DETECTED Final   Candida parapsilosis NOT DETECTED NOT DETECTED Final   Candida tropicalis NOT DETECTED NOT DETECTED Final  MRSA PCR Screening     Status: None   Collection  Time: 12/13/17  4:44 PM  Result Value Ref Range Status   MRSA by PCR NEGATIVE NEGATIVE Final    Comment:        The GeneXpert MRSA Assay (FDA approved for NASAL specimens only), is one component of a comprehensive MRSA colonization surveillance program. It is not intended to diagnose MRSA infection nor to guide or monitor treatment for MRSA infections.      Scheduled Meds: . folic acid  1 mg Oral Daily  . multivitamin with minerals  1 tablet Oral Daily  . nicotine  21 mg Transdermal Daily  . [START ON 12/17/2017] pantoprazole  40 mg Intravenous Q12H  . thiamine  100 mg Oral Daily   Or  . thiamine  100 mg Intravenous Daily     LOS: 1 day   Lonia BloodJeffrey T. Sheniqua Carolan, MD [facility] Office  [phone] Pager - Text Page per Loretha StaplerAmion as per below:  On-Call/Text Page:      [email or web address]      password [email or web address]  If 7PM-7AM, please contact night-coverage [email or web address] Password [email or web address] 12/14/2017, 10:58 AM

## 2017-12-14 NOTE — Progress Notes (Signed)
Initial Nutrition Assessment  DOCUMENTATION CODES:  Non-severe (moderate) malnutrition in context of social or environmental circumstances  INTERVENTION:  Patient may be experiencing electrolyte abnormalities associated with refeeding syndrome as K has dropped substantially. Mag dropped as well. Recommend checking Phos levels.   Spoke with SW who will see if they can help patient find therapist who would accept form of payment-Greatly appreciate help  Paged MD regarding diet advancement  Ensure Enlive po BID, each supplement provides 350 kcal and 20 grams of protein  NUTRITION DIAGNOSIS:  Inadequate oral intake related to vomiting, nausea, social / environmental circumstances (etoh abuse), chronic illness (ptsd, anxiety, depression, anorexia nervosa) as evidenced by mild muscle/fat loss and an estimated energy intake that has met < or  equal to 75% of needs for > or equal to 1 month.  GOAL:  Patient will meet greater than or equal to 90% of their needs  MONITOR:  PO intake, Supplement acceptance, Diet advancement, Labs, Weight trends  REASON FOR ASSESSMENT:  Malnutrition Screening Tool    ASSESSMENT:  36 y/o female PMHx PTSD, Depression, anxiety, anorexia nervosa, htn, etoh abuse. Presented to ED w/ severe N/V. In ED, suffered cardiac arrest due to vtach/fib. Received CPR and defib with ROSC. Admitted for management.   Patient unable to give detailed history. She reports she was not eating well at all PTA. She says that "1 swallow of water would cause me to vomit for 10 minutes". RD asked how long this had been going on and she replied "since February". She had not been taking any vitamins or drinking any supplements.   She says she had a large decline nutritionally since she stopped seeing her therapist for anorexia nervosa, depresison, and anxiety. She says she was no longer able to see her due to loss of insurance. She has relatively recently been approved for disability, but  laments she has not been able to find a therapist that will accept this form of payment. She has not spoken to any social workers for help. She has also not been taking any of her prescribed medications for similar reasons. RD spoke with SW on call about this and she said she would see if they had resources for patient   She is an alcoholic and typically drinks liquor daily.   She has had all her teeth removed, but says the oral surgeon left "bone spurs" that make wearing her dentures painful. She has some issues chewing some items, but it largely depends on the foods and how they are cooked. We discussed whether or not she would want a mechanically altered diet on diet advancement, but ultimately decided to let her try regular textures first.   Her weight history largely corroborates her history. In late 2014, she was 110 lbs. This is about the time she began to be treated for her issues.  She started gaining weight over the course of 2015 and by early 2016 she was 138 lbs. She continued to gained weight and by mid 2016 she was 145 lbs. She maintained this weight up until atleast October 2017 when she was 145-150lbs. Apparently, she lost her insurance between now and then and was lost to follow up. Bed weight confirms weight is 120 lbs. She has lost approximately 30 lbs since last October.   At this time, she is agreeable to supplements. She likes these and has taken them in the past. She says she tolerated her breakfast fairly well. SHe had some nausea, but feels this was "because  I am not used to sweet grits".  No issues w/ c/d.   RD returned at lunch time. She got full liquids. She wants to try eating regular items now. RD paged MD regarding potential diet advancement.   Labs: K:2.8->2.2, Mag 2.1-> 1.9,  Meds: Folate, MVI with min, PPi, KCL, IVF  Recent Labs  Lab 12/13/17 1819 12/13/17 2148 12/14/17 0057  NA 131* 129* 130*  K 2.8* 2.2* 2.2*  CL 96* 94* 94*  CO2 '23 26 26  '$ BUN 6 5* 5*   CREATININE 0.82 0.75 0.78  CALCIUM 7.1* 7.1* 7.2*  MG 2.1 1.9 1.9  GLUCOSE 68 104* 76   NUTRITION - FOCUSED PHYSICAL EXAM:   Most Recent Value  Orbital Region  No depletion  Upper Arm Region  Mild depletion  Thoracic and Lumbar Region  Mild depletion  Buccal Region  No depletion  Temple Region  Mild depletion  Clavicle Bone Region  Mild depletion  Clavicle and Acromion Bone Region  Mild depletion  Scapular Bone Region  No depletion  Dorsal Hand  Mild depletion  Patellar Region  Unable to assess  Anterior Thigh Region  Unable to assess  Posterior Calf Region  Unable to assess  Edema (RD Assessment)  None      Diet Order:  Diet full liquid Room service appropriate? Yes; Fluid consistency: Thin  EDUCATION NEEDS:  No education needs have been identified at this time  Skin:  Skin Assessment: Reviewed RN Assessment  Last BM:  12/20  Height:  Ht Readings from Last 1 Encounters:  12/13/17 '5\' 5"'$  (1.651 m)   Weight:  Wt Readings from Last 1 Encounters:  12/13/17 120 lb (54.4 kg)   Wt Readings from Last 10 Encounters:  12/13/17 120 lb (54.4 kg)  10/17/16 150 lb (68 kg)  10/16/16 146 lb (66.2 kg)  07/01/15 145 lb (65.8 kg)  05/13/15 140 lb (63.5 kg)  02/17/15 138 lb 3.2 oz (62.7 kg)  10/05/14 126 lb 11 oz (57.5 kg)  10/04/14 126 lb 11.2 oz (57.5 kg)  07/29/14 126 lb 9.6 oz (57.4 kg)  07/09/14 127 lb (57.6 kg)   Ideal Body Weight:  56.82 kg  BMI:  Body mass index is 19.97 kg/m.  Estimated Nutritional Needs:  Kcal:  1650-1900 kcals (30-35 kcal/kg bw)  Protein:  70-82g Pro (1.3-1.5 g/kg bw) Fluid:  >1.9 L (35 ml/kg bw)  Burtis Junes RD, LDN, CNSC Clinical Nutrition Pager: [phone] 12/14/2017 1:16 PM

## 2017-12-15 ENCOUNTER — Inpatient Hospital Stay (HOSPITAL_COMMUNITY): Payer: Medicare Other

## 2017-12-15 DIAGNOSIS — F5000 Anorexia nervosa, unspecified: Secondary | ICD-10-CM

## 2017-12-15 DIAGNOSIS — F419 Anxiety disorder, unspecified: Secondary | ICD-10-CM

## 2017-12-15 DIAGNOSIS — I340 Nonrheumatic mitral (valve) insufficiency: Secondary | ICD-10-CM

## 2017-12-15 DIAGNOSIS — E43 Unspecified severe protein-calorie malnutrition: Secondary | ICD-10-CM

## 2017-12-15 DIAGNOSIS — F17210 Nicotine dependence, cigarettes, uncomplicated: Secondary | ICD-10-CM

## 2017-12-15 DIAGNOSIS — F1910 Other psychoactive substance abuse, uncomplicated: Secondary | ICD-10-CM

## 2017-12-15 DIAGNOSIS — R450 Nervousness: Secondary | ICD-10-CM

## 2017-12-15 DIAGNOSIS — G4700 Insomnia, unspecified: Secondary | ICD-10-CM

## 2017-12-15 DIAGNOSIS — F4310 Post-traumatic stress disorder, unspecified: Secondary | ICD-10-CM

## 2017-12-15 DIAGNOSIS — F1210 Cannabis abuse, uncomplicated: Secondary | ICD-10-CM

## 2017-12-15 DIAGNOSIS — R109 Unspecified abdominal pain: Secondary | ICD-10-CM

## 2017-12-15 LAB — CBC
HCT: 32.1 % — ABNORMAL LOW (ref 36.0–46.0)
Hemoglobin: 11.4 g/dL — ABNORMAL LOW (ref 12.0–15.0)
MCH: 36.9 pg — ABNORMAL HIGH (ref 26.0–34.0)
MCHC: 35.5 g/dL (ref 30.0–36.0)
MCV: 103.9 fL — ABNORMAL HIGH (ref 78.0–100.0)
PLATELETS: 114 10*3/uL — AB (ref 150–400)
RBC: 3.09 MIL/uL — ABNORMAL LOW (ref 3.87–5.11)
RDW: 14.1 % (ref 11.5–15.5)
WBC: 9 10*3/uL (ref 4.0–10.5)

## 2017-12-15 LAB — HEPATITIS PANEL, ACUTE
HCV Ab: <0.1 s/co ratio (ref 0.0–0.9)
HEP B C IGM: NEGATIVE
Hep A IgM: NEGATIVE
Hepatitis B Surface Ag: NEGATIVE

## 2017-12-15 LAB — BASIC METABOLIC PANEL
Anion gap: 4 — ABNORMAL LOW (ref 5–15)
CALCIUM: 7.5 mg/dL — AB (ref 8.9–10.3)
CO2: 23 mmol/L (ref 22–32)
CREATININE: 0.56 mg/dL (ref 0.44–1.00)
Chloride: 105 mmol/L (ref 101–111)
GFR calc Af Amer: >60 mL/min (ref >60)
GLUCOSE: 104 mg/dL — AB (ref 65–99)
POTASSIUM: 3.2 mmol/L — AB (ref 3.5–5.1)
SODIUM: 132 mmol/L — AB (ref 135–145)

## 2017-12-15 LAB — ECHOCARDIOGRAM COMPLETE
Height: 65 in
Weight: 1920 oz

## 2017-12-15 LAB — MAGNESIUM
MAGNESIUM: 1.4 mg/dL — AB (ref 1.7–2.4)
MAGNESIUM: 1.6 mg/dL — AB (ref 1.7–2.4)

## 2017-12-15 LAB — POTASSIUM: POTASSIUM: 3.6 mmol/L (ref 3.5–5.1)

## 2017-12-15 MED ORDER — MAGNESIUM SULFATE 4 GM/100ML IV SOLN: 4.0000 g | Freq: Once | INTRAVENOUS | Status: DC

## 2017-12-15 MED ORDER — METOPROLOL TARTRATE 5 MG/5ML IV SOLN
2.5000 mg | Freq: Four times a day (QID) | INTRAVENOUS | Status: DC
  Administered 2017-12-15 – 2017-12-16 (×5): 2.5 mg via INTRAVENOUS
  Filled 2017-12-15 (×5): qty 5

## 2017-12-15 MED ORDER — LORAZEPAM 1 MG PO TABS
2.0000 mg | ORAL_TABLET | ORAL | Status: DC | PRN
  Administered 2017-12-15 – 2017-12-16 (×4): 2 mg via ORAL
  Filled 2017-12-15 (×4): qty 2

## 2017-12-15 MED ORDER — DEXTROSE 5 % IV SOLN
3.0000 g | Freq: Once | INTRAVENOUS | Status: AC
  Administered 2017-12-15: 3 g via INTRAVENOUS
  Filled 2017-12-15 (×2): qty 6

## 2017-12-15 MED ORDER — POTASSIUM CHLORIDE CRYS ER 20 MEQ PO TBCR
50.0000 meq | EXTENDED_RELEASE_TABLET | Freq: Once | ORAL | Status: AC
  Administered 2017-12-15: 50 meq via ORAL
  Filled 2017-12-15: qty 2

## 2017-12-15 MED ORDER — SUCRALFATE 1 GM/10ML PO SUSP
1.0000 g | Freq: Three times a day (TID) | ORAL | Status: DC
  Administered 2017-12-15 – 2017-12-18 (×11): 1 g via ORAL
  Filled 2017-12-15 (×11): qty 10

## 2017-12-15 MED ORDER — POTASSIUM CHLORIDE CRYS ER 10 MEQ PO TBCR
50.0000 meq | EXTENDED_RELEASE_TABLET | Freq: Once | ORAL | Status: AC
  Administered 2017-12-15: 11:00:00 50 meq via ORAL
  Filled 2017-12-15: qty 2

## 2017-12-15 MED ORDER — MAGNESIUM SULFATE 50 % IJ SOLN
3.0000 g | Freq: Once | INTRAVENOUS | Status: AC
  Administered 2017-12-15: 3 g via INTRAVENOUS
  Filled 2017-12-15: qty 6

## 2017-12-15 MED ORDER — LORAZEPAM 2 MG/ML IJ SOLN
2.0000 mg | INTRAMUSCULAR | Status: DC | PRN
  Administered 2017-12-15: 2 mg via INTRAVENOUS
  Filled 2017-12-15: qty 1

## 2017-12-15 NOTE — Progress Notes (Signed)
I was in the room trying to start an IV on the pt. Was unable to get IV. Was turning my back to throw away material, when I saw the pt trying to get up and got tangled on equipment. Pt fell on her knees. Pt. Was able to get back in bed without any help. She stated that this happen two or three times a week at home because of past foot injury. Pt states she stepped on a piece of glass in sept. 9,2015 that ripped  Ligaments and veins that now causes her not have full sensation of the right foot.  Pt. Fell to the floor on her knees. Pt has old bruising on bilateral knees. No signs of new bruising at this time. MD notified.

## 2017-12-15 NOTE — Progress Notes (Signed)
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  She reported drinking liquor every day, but quit 3-4 days prior to this admit due to significant nausea and vomiting.    In the ED she was found to have a WBC 20, potassium 2.5, magnesium 1, creatinine 1.3, total bili 2.6, AST 61.  CT Abd/pelvis without acute abdominal abnormality but w/ patchy opacities in the lungs.  She was Guaiac positive, and started on Protonix bolus and drip in the ED.  EKG in the ED noted prolonged QTc 620. While she is in the ED, she had cardiac arrest due to Vtach/Afib, she received cpr and s/p defib with RSOC. She is doing better, EDP discussed with critical care who declined admission.    Subjective: 12/23  A/O 4, negative SOB, positive CP described as a tight band across her chest that waxes and wanes. Negative radiation, negative diaphoresis, Does not recall if she was having CP prior to cardiac arrest. States consumes 7 shots of liquor per day had done this up till Tuesday when she discontinued abruptly positive N/V worsened. States has had vomiting for 3 months. Smokes a bowl of marijuana per day secondary to not being able to obtain psychiatric medication. States has not seen a psychiatrist in 2 years secondary to insurance problems.    Assessment & Plan:   Active Problems:   Cardiac arrest with ventricular fibrillation (HCC)  V. Fib Arrest in ED  -CPR and Defib>>> ROSC -Echocardiogram pending -Avoid all QT prolonging medication. -Maintain on telemetry at all times. -Aggressively correct all electrolyte abnormalities.  Sinus tachycardia/HTN -Metoprolol 2.5 mg QID  Prolonged QT interval -After  electrolytes WNL obtain EKG. If still prolonged consult cardiology  Abdominal pain/N/V/D -Most likely multifactorial to include EtOH abuse, esophagitis, gastritis, EtOH withdrawal, fatty liver. -  loperamide -Zofran -CIWA protocol -Protonix 40 mg BID -Carafate -If signs and symptoms persist may require EGD   Hypokalemia  -Potassium goal >> 4  - K-Dur 50 mEq -Repeat K/Mg @1500 : Repeat K low K-Dur 50 mEq   Hypomagnesemia -Magnesium goal> 2 -Magnesium IV 3 g; repeat Mg @ 1500 low magnesium IV 3 g   Hyponatremia -Secondary to EtOH abuse monitor closely   Aspiration Pneumonia vs Pneumonitis -Negative fever, negative leukocytosis, negative bands, negative left shift hold on antibiotics    Gram + cocci in 1 of 1 blood cx Unfortunately it appears only 1 blood cx was obtained - this is suspicious for contamination, but pt is also at risk of potential bacteremia - stop all abx for now and follow for speciation and sensitivities to guide need for further eval    Transaminitis - Fatty liver  -Secondary to EtOH abuse -Acute hepatitis panel pending    EtOH abuse/Polysubstance abuse -12/22 UDS positive for marijuana - CIWA protocol  -Discussed at length need to discontinue drinking, and marijuana use  Tobacco abuse -Discussed at length need to discontinue use of all tobacco products.   Severe protein calorie malnutrition -Nutrition consult placed   Malnutrition - underweight - Body mass index is 19.97 kg/m. Nutrition consult   PTSD, Anxiety/Depression, Anorexia Nervosa -Consult placed via [facility] to Behavioral Health: Polysubstance abuse, needs to restart medication for PTSD anxiety/depression. Needs referral to  psychiatrist who will accept Medicare for treatment of anorexia nervosa -Nutrition consult placed: Serious electrolyte abnormalities evaluate for nutritional needs.        DVT prophylaxis: SCD Code Status: Full Family Communication: None Disposition Plan:  TBD   Consultants:  Psychiatry pending   Procedures/Significant Events:  12/21 CT head WO contrast: Negative acute findings 12/21 CT abdomen pelvis with contrast:-Interval multiple focal patchy opacities in the left lower lobe, suspicious for pneumonia or aspiration pneumonitis. -Stable diffuse hepatic steatosis. Echocardiogram pending   I have personally reviewed and interpreted all radiology studies and my findings are as above.  VENTILATOR SETTINGS:    Cultures   Antimicrobials: Anti-infectives (From admission, onward)   Start     Stop   12/14/17 1500  levofloxacin (LEVAQUIN) IVPB 750 mg  Status:  Discontinued     12/13/17 1705   12/14/17 1400  metroNIDAZOLE (FLAGYL) IVPB 500 mg  Status:  Discontinued     12/14/17 1145   12/14/17 0700  vancomycin (VANCOCIN) IVPB 750 mg/150 ml premix  Status:  Discontinued     12/14/17 1145   12/14/17 0400  vancomycin (VANCOCIN) IVPB 750 mg/150 ml premix  Status:  Discontinued     12/13/17 1644   12/13/17 1730  aztreonam (AZACTAM) 2 g in dextrose 5 % 50 mL IVPB  Status:  Discontinued     12/14/17 1143   12/13/17 1430  levofloxacin (LEVAQUIN) IVPB 750 mg     12/13/17 1624   12/13/17 1430  vancomycin (VANCOCIN) IVPB 1000 mg/200 mL premix     12/13/17 1554       Devices    LINES / TUBES:      Continuous Infusions: . sodium chloride 75 mL/hr at 12/15/17 0509     Objective: Vitals:   12/15/17 0400 12/15/17 0453 12/15/17 0500 12/15/17 0600  BP: 119/79 119/79 121/80 133/87  Pulse: 78 83 76 78  Resp:  15 (!) 24 (!) 24  Temp:      TempSrc:      SpO2:  100% 96% 96%  Weight:      Height:        Intake/Output Summary (Last 24 hours) at 12/15/2017 0730 Last data filed at 12/14/2017 1500 Gross per 24 hour  Intake 698.75 ml  Output 600 ml  Net 98.75 ml   Filed Weights   12/13/17 1003  Weight: 120 lb (54.4 kg)    Examination:  General: A/O 4, No acute respiratory distress, cachectic ENT: Negative Runny nose,  negative gingival bleeding, patient without teeth states has dentures cannot use them secondary to continuing bone spurs Neck:  Negative scars, masses, torticollis, lymphadenopathy, JVD Lungs: Clear to auscultation bilaterally without wheezes or crackles Cardiovascular: Tachycardic, Regular rhythm without murmur gallop or rub normal S1 and S2 Abdomen: negative abdominal pain, nondistended, positive soft, bowel sounds, no rebound, no ascites, no appreciable mass Extremities: No significant cyanosis, clubbing, or edema bilateral lower extremities Skin: Negative rashes, lesions, ulcers Psychiatric:  Positive depression, positive anxiety, negative fatigue, negative mania, poor understanding of overall health issues. Central nervous system:  Cranial nerves II through XII intact, tongue/uvula midline, all extremities muscle strength 5/5, sensation intact throughout, negative dysarthria, negative expressive aphasia,negative receptive aphasia.  .     Data Reviewed: Care during the described time interval was provided by me .  I have reviewed this patient's available data, including medical history, events of note, physical examination, and all test results as part of my evaluation.   CBC: Recent Labs  Lab  12/13/17 1004 12/15/17 0253  WBC 20.4* 9.0  HGB 18.3* 11.4*  HCT 48.4* 32.1*  MCV 100.6* 103.9*  PLT 191 114*   Basic Metabolic Panel: Recent Labs  Lab 12/13/17 1004 12/13/17 1819 12/13/17 2148 12/14/17 0057 12/14/17 1423 12/15/17 0253  NA 130* 131* 129* 130* 131* 132*  K 2.5* 2.8* 2.2* 2.2* 2.6* 3.2*  CL 78* 96* 94* 94* 98* 105  CO2 26 23 26 26 24 23   GLUCOSE 136* 68 104* 76 76 104*  BUN 8 6 5* 5* <5* <5*  CREATININE 1.32* 0.82 0.75 0.78 0.57 0.56  CALCIUM 8.7* 7.1* 7.1* 7.2* 7.4* 7.5*  MG 1.0* 2.1 1.9 1.9  --  1.4*   GFR: Estimated Creatinine Clearance: 82.7 mL/min (by C-G formula based on SCr of 0.56 mg/dL). Liver Function Tests: Recent Labs  Lab 12/13/17 1004  AST 61*   ALT 28  ALKPHOS 61  BILITOT 2.6*  PROT 7.0  ALBUMIN 3.7   Recent Labs  Lab 12/13/17 1004  LIPASE 21   No results for input(s): AMMONIA in the last 168 hours. Coagulation Profile: No results for input(s): INR, PROTIME in the last 168 hours. Cardiac Enzymes: No results for input(s): CKTOTAL, CKMB, CKMBINDEX, TROPONINI in the last 168 hours. BNP (last 3 results) No results for input(s): PROBNP in the last 8760 hours. HbA1C: No results for input(s): HGBA1C in the last 72 hours. CBG: No results for input(s): GLUCAP in the last 168 hours. Lipid Profile: No results for input(s): CHOL, HDL, LDLCALC, TRIG, CHOLHDL, LDLDIRECT in the last 72 hours. Thyroid Function Tests: Recent Labs    12/13/17 1819  TSH 1.230   Anemia Panel: No results for input(s): VITAMINB12, FOLATE, FERRITIN, TIBC, IRON, RETICCTPCT in the last 72 hours. Urine analysis:    Component Value Date/Time   COLORURINE YELLOW 08/19/2017 1926   APPEARANCEUR CLEAR 08/19/2017 1926   LABSPEC 1.020 08/19/2017 1926   PHURINE 6.0 08/19/2017 1926   GLUCOSEU NEGATIVE 08/19/2017 1926   HGBUR NEGATIVE 08/19/2017 1926   BILIRUBINUR MODERATE (A) 08/19/2017 1926   KETONESUR 80 (A) 08/19/2017 1926   PROTEINUR 30 (A) 08/19/2017 1926   UROBILINOGEN 0.2 04/30/2015 0224   NITRITE NEGATIVE 08/19/2017 1926   LEUKOCYTESUR NEGATIVE 08/19/2017 1926   Sepsis Labs: @LABRCNTIP (procalcitonin:4,lacticidven:4)  ) Recent Results (from the past 240 hour(s))  Blood culture (routine x 2)     Status: None (Preliminary result)   Collection Time: 12/13/17 12:20 PM  Result Value Ref Range Status   Specimen Description BLOOD LEFT ANTECUBITAL  Final   Special Requests   Final    BOTTLES DRAWN AEROBIC AND ANAEROBIC Blood Culture adequate volume   Culture NO GROWTH 1 DAY  Final   Report Status PENDING  Incomplete  Blood culture (routine x 2)     Status: None (Preliminary result)   Collection Time: 12/13/17 12:47 PM  Result Value Ref Range  Status   Specimen Description BLOOD LEFT WRIST  Final   Special Requests   Final    BOTTLES DRAWN AEROBIC AND ANAEROBIC Blood Culture adequate volume   Culture  Setup Time   Final    GRAM POSITIVE COCCI IN BOTH AEROBIC AND ANAEROBIC BOTTLES CRITICAL RESULT CALLED TO, READ BACK BY AND VERIFIED WITHShela Commons: [facility] PHARMD [id number] 12/14/17 A BROWNING    Culture GRAM POSITIVE COCCI  Final   Report Status PENDING  Incomplete  Blood Culture ID Panel (Reflexed)     Status: Abnormal   Collection Time: 12/13/17 12:47 PM  Result Value  Ref Range Status   Enterococcus species NOT DETECTED NOT DETECTED Final   Listeria monocytogenes NOT DETECTED NOT DETECTED Final   Staphylococcus species DETECTED (A) NOT DETECTED Final    Comment: Methicillin (oxacillin) resistant coagulase negative staphylococcus. Possible blood culture contaminant (unless isolated from more than one blood culture draw or clinical case suggests pathogenicity). No antibiotic treatment is indicated for blood  culture contaminants. CRITICAL RESULT CALLED TO, READ BACK BY AND VERIFIED WITH: J LEDFORD PHARMD [id number] 12/14/17 A BROWNING    Staphylococcus aureus NOT DETECTED NOT DETECTED Final   Methicillin resistance DETECTED (A) NOT DETECTED Final    Comment: CRITICAL RESULT CALLED TO, READ BACK BY AND VERIFIED WITH: Melven Sartorius PHARMD [id number] 12/14/17 A BROWNING    Streptococcus species NOT DETECTED NOT DETECTED Final   Streptococcus agalactiae NOT DETECTED NOT DETECTED Final   Streptococcus pneumoniae NOT DETECTED NOT DETECTED Final   Streptococcus pyogenes NOT DETECTED NOT DETECTED Final   Acinetobacter baumannii NOT DETECTED NOT DETECTED Final   Enterobacteriaceae species NOT DETECTED NOT DETECTED Final   Enterobacter cloacae complex NOT DETECTED NOT DETECTED Final   Escherichia coli NOT DETECTED NOT DETECTED Final   Klebsiella oxytoca NOT DETECTED NOT DETECTED Final   Klebsiella pneumoniae NOT DETECTED NOT DETECTED Final   Proteus species  NOT DETECTED NOT DETECTED Final   Serratia marcescens NOT DETECTED NOT DETECTED Final   Haemophilus influenzae NOT DETECTED NOT DETECTED Final   Neisseria meningitidis NOT DETECTED NOT DETECTED Final   Pseudomonas aeruginosa NOT DETECTED NOT DETECTED Final   Candida albicans NOT DETECTED NOT DETECTED Final   Candida glabrata NOT DETECTED NOT DETECTED Final   Candida krusei NOT DETECTED NOT DETECTED Final   Candida parapsilosis NOT DETECTED NOT DETECTED Final   Candida tropicalis NOT DETECTED NOT DETECTED Final  MRSA PCR Screening     Status: None   Collection Time: 12/13/17  4:44 PM  Result Value Ref Range Status   MRSA by PCR NEGATIVE NEGATIVE Final    Comment:        The GeneXpert MRSA Assay (FDA approved for NASAL specimens only), is one component of a comprehensive MRSA colonization surveillance program. It is not intended to diagnose MRSA infection nor to guide or monitor treatment for MRSA infections.          Radiology Studies: Ct Head Wo Contrast  Result Date: 12/13/2017 CLINICAL DATA:  Seizure.  Alcohol abuse disorder. EXAM: CT HEAD WITHOUT CONTRAST TECHNIQUE: Contiguous axial images were obtained from the base of the skull through the vertex without intravenous contrast. COMPARISON:  None. FINDINGS: Brain: Premature for age cerebral and cerebellar atrophy. No evidence for acute infarction, hemorrhage, mass lesion, hydrocephalus, or extra-axial fluid. No definite white matter disease. Vascular:  Negative. Skull: Negative. Sinuses/Orbits: Negative. Other: Negative. IMPRESSION: Premature for age atrophy.  No acute intracranial findings. Electronically Signed   By: Elsie Stain M.D.   On: 12/13/2017 13:50   Ct Abdomen Pelvis W Contrast  Result Date: 12/13/2017 CLINICAL DATA:  Nausea and vomiting. Anorexia nervosa. Smoker. Alcohol abuse. EXAM: CT ABDOMEN AND PELVIS WITH CONTRAST TECHNIQUE: Multidetector CT imaging of the abdomen and pelvis was performed using the  standard protocol following bolus administration of intravenous contrast. CONTRAST:  ISOVUE-300 IOPAMIDOL (ISOVUE-300) INJECTION 61% COMPARISON:  08/19/2017 FINDINGS: Lower chest: Interval multiple focal areas of patchy opacity in the left lower lobe. Hepatobiliary: Diffuse low density of the liver relative to the spleen without significant change. Unremarkable gallbladder. Pancreas: Unremarkable. No pancreatic ductal dilatation or  surrounding inflammatory changes. Spleen: Normal in size without focal abnormality. Adrenals/Urinary Tract: Unremarkable adrenal glands. Stable left renal cyst. Unremarkable right kidney, ureters and urinary bladder. Stomach/Bowel: Stomach is within normal limits. Appendix appears normal. No evidence of bowel wall thickening, distention, or inflammatory changes. Vascular/Lymphatic: No significant vascular findings are present. No enlarged abdominal or pelvic lymph nodes. Reproductive: Uterus and bilateral adnexa are unremarkable. Tampon in the vagina. Other: Small umbilical hernia containing fat. Musculoskeletal: Mild degenerative changes at the L5-S1 level. IMPRESSION: 1. Interval multiple focal patchy opacities in the left lower lobe, suspicious for pneumonia or aspiration pneumonitis. 2. Stable diffuse hepatic steatosis. Electronically Signed   By: Beckie Salts M.D.   On: 12/13/2017 13:53   Dg Chest Port 1 View  Result Date: 12/13/2017 CLINICAL DATA:  Three-day history of generalized chest pain, nausea and vomiting, and diarrhea. Metabolic acidosis. EXAM: PORTABLE CHEST 1 VIEW COMPARISON:  10/04/2014, 08/15/2009 and earlier. FINDINGS: External pacing pads are present. Cardiomediastinal silhouette unremarkable for AP portable technique, unchanged. Lungs clear. Bronchovascular markings normal. Pulmonary vascularity normal. No visible pleural effusions. No pneumothorax. IMPRESSION: No acute cardiopulmonary disease. Electronically Signed   By: Hulan Saas M.D.   On:  12/13/2017 14:07        Scheduled Meds: . feeding supplement (ENSURE ENLIVE)  237 mL Oral BID BM  . folic acid  1 mg Oral Daily  . multivitamin with minerals  1 tablet Oral Daily  . nicotine  21 mg Transdermal Daily  . pantoprazole  40 mg Oral BID AC  . thiamine  100 mg Oral Daily   Continuous Infusions: . sodium chloride 75 mL/hr at 12/15/17 0509     LOS: 2 days    Time spent: 40 minutes    WOODS, Roselind Messier, MD [facility] Pager [phone]   If 7PM-7AM, please contact night-coverage [email or web address] Password [email or web address] 12/15/2017, 7:30 AM

## 2017-12-15 NOTE — Progress Notes (Signed)
Echocardiogram 2D Echocardiogram has been performed.  Gina Daniels, Gina Daniels 12/15/2017, 2:12 PM
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
## 2017-12-16 LAB — COMPREHENSIVE METABOLIC PANEL
ALK PHOS: 47 U/L (ref 38–126)
ALT: 27 U/L (ref 14–54)
AST: 30 U/L (ref 15–41)
Albumin: 2.9 g/dL — ABNORMAL LOW (ref 3.5–5.0)
Anion gap: 7 (ref 5–15)
CALCIUM: 8.1 mg/dL — AB (ref 8.9–10.3)
CHLORIDE: 107 mmol/L (ref 101–111)
CO2: 24 mmol/L (ref 22–32)
CREATININE: 0.53 mg/dL (ref 0.44–1.00)
GFR calc Af Amer: >60 mL/min (ref >60)
Glucose, Bld: 97 mg/dL (ref 65–99)
Potassium: 3.5 mmol/L (ref 3.5–5.1)
SODIUM: 138 mmol/L (ref 135–145)
Total Bilirubin: 0.9 mg/dL (ref 0.3–1.2)
Total Protein: 5.3 g/dL — ABNORMAL LOW (ref 6.5–8.1)

## 2017-12-16 LAB — BASIC METABOLIC PANEL
Anion gap: 7 (ref 5–15)
CHLORIDE: 108 mmol/L (ref 101–111)
CO2: 22 mmol/L (ref 22–32)
CREATININE: 0.41 mg/dL — AB (ref 0.44–1.00)
Calcium: 8 mg/dL — ABNORMAL LOW (ref 8.9–10.3)
GFR calc Af Amer: >60 mL/min (ref >60)
GFR calc non Af Amer: >60 mL/min (ref >60)
Glucose, Bld: 99 mg/dL (ref 65–99)
POTASSIUM: 3.5 mmol/L (ref 3.5–5.1)
SODIUM: 137 mmol/L (ref 135–145)

## 2017-12-16 LAB — CULTURE, BLOOD (ROUTINE X 2): Special Requests: ADEQUATE

## 2017-12-16 LAB — MAGNESIUM: MAGNESIUM: 2.9 mg/dL — AB (ref 1.7–2.4)

## 2017-12-16 MED ORDER — VENLAFAXINE HCL 37.5 MG PO TABS
37.5000 mg | ORAL_TABLET | Freq: Two times a day (BID) | ORAL | Status: DC
  Administered 2017-12-16 – 2017-12-18 (×4): 37.5 mg via ORAL
  Filled 2017-12-16 (×5): qty 1

## 2017-12-16 MED ORDER — METOPROLOL TARTRATE 12.5 MG HALF TABLET
12.5000 mg | ORAL_TABLET | Freq: Three times a day (TID) | ORAL | Status: DC
  Administered 2017-12-16 – 2017-12-17 (×3): 12.5 mg via ORAL
  Filled 2017-12-16 (×3): qty 1

## 2017-12-16 MED ORDER — LORAZEPAM 1 MG PO TABS
1.0000 mg | ORAL_TABLET | Freq: Three times a day (TID) | ORAL | Status: DC
  Administered 2017-12-16 – 2017-12-18 (×7): 1 mg via ORAL
  Filled 2017-12-16 (×7): qty 1

## 2017-12-16 MED ORDER — LAMOTRIGINE 25 MG PO TABS
25.0000 mg | ORAL_TABLET | Freq: Every day | ORAL | Status: DC
  Administered 2017-12-16 – 2017-12-18 (×3): 25 mg via ORAL
  Filled 2017-12-16 (×4): qty 1

## 2017-12-16 MED ORDER — HYDROXYZINE HCL 25 MG PO TABS: 25.0000 mg | ORAL_TABLET | Freq: Three times a day (TID) | ORAL | Status: DC | PRN

## 2017-12-16 NOTE — Progress Notes (Signed)
EKG CRITICAL VALUE     12 lead EKG performed.  Critical value noted.  Ann RN notified.   Irish EldersCorazon R Braylea Brancato, [place] 12/16/2017 8:38 AM

## 2017-12-16 NOTE — Care Management (Signed)
Pt has active insurance (Medicare) - even though Part D is not active - per  PTA medications CM will not be able to provide medication assistance.  CM will continue to follow

## 2017-12-16 NOTE — Progress Notes (Signed)
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  She reported drinking liquor every day, but quit 3-4 days prior to this admit due to significant nausea and vomiting.    In the ED she was found to have a WBC 20, potassium 2.5, magnesium 1, creatinine 1.3, total bili 2.6, AST 61.  CT Abd/pelvis without acute abdominal abnormality but w/ patchy opacities in the lungs.  She was Guaiac positive, and started on Protonix bolus and drip in the ED.  EKG in the ED noted prolonged QTc 620. While she is in the ED, she had cardiac arrest due to Vtach/Afib, she received cpr and s/p defib with RSOC. She is doing better, EDP discussed with critical care who declined admission.    Subjective: 12/16/17  A/O 4, negative SOB, neg CP ,  States consumes 7 shots of liquor per day had done this up till 12/10/17  when she discontinued abruptly.  States has had vomiting for 3 months. Smokes a bowl of marijuana per day secondary to not being able to obtain psychiatric medication. States has not seen a psychiatrist in 2 years secondary to insurance problems. No new Complaints at this time, cooperative    Assessment & Plan:   Active Problems:   Cardiac arrest with ventricular fibrillation (HCC)  1)V. Fib Arrest in ED -on admission QTC was 620, magnesium was 1.0 with a potassium of 2.6 -CPR and Defib>>> ROSC -Echocardiogram with preserved EF of 55-60%, no significant abnormalities are hours,  -Avoid all QT prolonging medication, EKG on 12/16/2017 with a QTc of 561. -Maintain on telemetry at all times, continue to monitor magnesium, potassium and calcium -.  2)Sinus tachycardia/HTN-stable, stop IV metoprolol, start  p.o. metoprolol 12.5 mg 3 times daily for rate control and BP control   3)Prolonged QT interval- EKG on 12/16/2017 with a QTc of 561, despite correcting electrolyte abnormalities, cardiology consult requested, echocardiogram as noted above  4)FEN/Hypokalemia/hypomagnesemia and hyponatremia-continue to replace and monitor electrolytes , electrolyte abnormalities most likely related to alcohol abuse and nutritional marasms in the setting of anorexia  5)Aspiration Pneumonia vs Pneumonitis - -Negative fever, negative leukocytosis, negative bands, negative left shift hold on antibiotics    6) coag negative staph in 1 of 1 blood cx- Unfortunately it appears only 1 blood cx was obtained on 12/13/17,  - this is suspicious for contamination, but pt is also at risk of potential bacteremia , currently off antibiotics, repeat blood cultures requested 12/16/2017   7)Transaminitis - Fatty liver - Secondary to EtOH abuse, acute viral hepatitis panel is negative  8)EtOH abuse/Polysubstance abuse- -12/22 UDS positive for marijuana, no evidence of frank DTs at this time last alcoholic intake was 10/10/2017, may change lorazepam to 1 mg 3 times daily as needed, continue thiamine and folic acid  9)Tobacco abuse-tobacco cessation advised, nicotine patches prescribed  10)Severe protein calorie malnutrition-nutritional consult requested, BMI is around 20  11)PTSD, Anxiety/Depression, Anorexia Nervosa- psychiatric  consult appreciated, advised to restart Effexor 37.5 mg twice daily, Lamictal 25 mg daily and Vistaril 25 mg 3 times daily as needed  DVT prophylaxis: SCD Code Status: Full Family Communication: None Disposition Plan: home in 1-2 days if cleared by psychiatry and cardiology   Consultants:  Psychiatry and  cardiology   Procedures/Significant Events:  12/21 CT head WO contrast: Negative acute findings 12/21 CT abdomen pelvis with contrast:-Interval multiple focal patchy opacities in the left lower  lobe, suspicious for pneumonia or aspiration pneumonitis. -Stable diffuse hepatic steatosis. Echocardiogram    VENTILATOR SETTINGS: N/A  Cultures-coagulase-negative staph from blood culture obtained on 12/13/2017   Antimicrobials: Anti-infectives (From admission, onward)   Start     Stop   12/14/17 1500  levofloxacin (LEVAQUIN) IVPB 750 mg  Status:  Discontinued     12/13/17 1705   12/14/17 1400  metroNIDAZOLE (FLAGYL) IVPB 500 mg  Status:  Discontinued     12/14/17 1145   12/14/17 0700  vancomycin (VANCOCIN) IVPB 750 mg/150 ml premix  Status:  Discontinued     12/14/17 1145   12/14/17 0400  vancomycin (VANCOCIN) IVPB 750 mg/150 ml premix  Status:  Discontinued     12/13/17 1644   12/13/17 1730  aztreonam (AZACTAM) 2 g in dextrose 5 % 50 mL IVPB  Status:  Discontinued     12/14/17 1143   12/13/17 1430  levofloxacin (LEVAQUIN) IVPB 750 mg     12/13/17 1624   12/13/17 1430  vancomycin (VANCOCIN) IVPB 1000 mg/200 mL premix     12/13/17 1554      Continuous Infusions: . sodium chloride Stopped (12/15/17 2100)     Objective: Vitals:   12/16/17 0821 12/16/17 1219 12/16/17 1400 12/16/17 1537  BP: (!) 149/95 112/66 (!) 146/92   Pulse: 97 (!) 105    Resp: 18 18 (!) 31   Temp:  98.1 F (36.7 C)  99.2 F (37.3 C)  TempSrc:  Oral  Oral  SpO2: 100% 100%    Weight:      Height:        Intake/Output Summary (Last 24 hours) at 12/16/2017 1538 Last data filed at 12/16/2017 1300 Gross per 24 hour  Intake 2896 ml  Output -  Net 2896 ml   Filed Weights   12/13/17 1003  Weight: 54.4 kg (120 lb)    Examination:    General: A/O 4, No acute respiratory distress HEENT:patient without teeth states has dentures cannot use them secondary to continuing bone spurs Neck:  Negative  JVD Lungs: Clear to auscultation bilaterally without wheezes or crackles Cardiovascular: Tachycardic, Regular rhythm , normal S1 and S2 Abdomen: negative abdominal pain, nondistended, positive  soft, bowel sounds, Extremities: No  edema  Skin: Negative rashes Psychiatric:  Positive anxiety,  negative mania, denies suicidal or homicidal ideation or plan Central nervous system: No significant tremors or new focal deficits at this time   CBC: Recent Labs  Lab 12/13/17 1004 12/15/17 0253  WBC 20.4* 9.0  HGB 18.3* 11.4*  HCT 48.4* 32.1*  MCV 100.6* 103.9*  PLT 191 114*   Basic Metabolic Panel: Recent Labs  Lab 12/13/17 2148 12/14/17 0057 12/14/17 1423 12/15/17 0253 12/15/17 1545 12/16/17 0206 12/16/17 0750  NA 129* 130* 131* 132*  --  137 138  K 2.2* 2.2* 2.6* 3.2* 3.6 3.5 3.5  CL 94* 94* 98* 105  --  108 107  CO2 26 26 24 23   --  22 24  GLUCOSE 104* 76 76 104*  --  99 97  BUN 5* 5* <5* <5*  --  <5* <5*  CREATININE 0.75 0.78 0.57 0.56  --  0.41* 0.53  CALCIUM 7.1* 7.2* 7.4* 7.5*  --  8.0* 8.1*  MG 1.9 1.9  --  1.4* 1.6* 2.9*  --    GFR:  Estimated Creatinine Clearance: 82.7 mL/min (by C-G formula based on SCr of 0.53 mg/dL). Liver Function Tests: Recent Labs  Lab 12/13/17 1004 12/16/17 0750  AST 61* 30  ALT 28 27  ALKPHOS 61 47  BILITOT 2.6* 0.9  PROT 7.0 5.3*  ALBUMIN 3.7 2.9*   Recent Labs  Lab 12/13/17 1004  LIPASE 21   No results for input(s): AMMONIA in the last 168 hours. Coagulation Profile: No results for input(s): INR, PROTIME in the last 168 hours. Cardiac Enzymes: No results for input(s): CKTOTAL, CKMB, CKMBINDEX, TROPONINI in the last 168 hours. BNP (last 3 results) No results for input(s): PROBNP in the last 8760 hours. HbA1C: No results for input(s): HGBA1C in the last 72 hours. CBG: No results for input(s): GLUCAP in the last 168 hours. Lipid Profile: No results for input(s): CHOL, HDL, LDLCALC, TRIG, CHOLHDL, LDLDIRECT in the last 72 hours. Thyroid Function Tests: Recent Labs    12/13/17 1819  TSH 1.230   Anemia Panel: No results for input(s): VITAMINB12, FOLATE, FERRITIN, TIBC, IRON, RETICCTPCT in the last 72  hours. Urine analysis:    Component Value Date/Time   COLORURINE YELLOW 08/19/2017 1926   APPEARANCEUR CLEAR 08/19/2017 1926   LABSPEC 1.020 08/19/2017 1926   PHURINE 6.0 08/19/2017 1926   GLUCOSEU NEGATIVE 08/19/2017 1926   HGBUR NEGATIVE 08/19/2017 1926   BILIRUBINUR MODERATE (A) 08/19/2017 1926   KETONESUR 80 (A) 08/19/2017 1926   PROTEINUR 30 (A) 08/19/2017 1926   UROBILINOGEN 0.2 04/30/2015 0224   NITRITE NEGATIVE 08/19/2017 1926   LEUKOCYTESUR NEGATIVE 08/19/2017 1926   Sepsis Labs: @LABRCNTIP (procalcitonin:4,lacticidven:4)  ) Recent Results (from the past 240 hour(s))  Blood culture (routine x 2)     Status: None (Preliminary result)   Collection Time: 12/13/17 12:20 PM  Result Value Ref Range Status   Specimen Description BLOOD LEFT ANTECUBITAL  Final   Special Requests   Final    BOTTLES DRAWN AEROBIC AND ANAEROBIC Blood Culture adequate volume   Culture NO GROWTH 3 DAYS  Final   Report Status PENDING  Incomplete  Blood culture (routine x 2)     Status: Abnormal   Collection Time: 12/13/17 12:47 PM  Result Value Ref Range Status   Specimen Description BLOOD LEFT WRIST  Final   Special Requests   Final    BOTTLES DRAWN AEROBIC AND ANAEROBIC Blood Culture adequate volume   Culture  Setup Time   Final    GRAM POSITIVE COCCI IN BOTH AEROBIC AND ANAEROBIC BOTTLES CRITICAL RESULT CALLED TO, READ BACK BY AND VERIFIED WITH: J LEDFORD PHARMD [id number] 12/14/17 A BROWNING    Culture (A)  Final    STAPHYLOCOCCUS SPECIES (COAGULASE NEGATIVE) THE SIGNIFICANCE OF ISOLATING THIS ORGANISM FROM A SINGLE SET OF BLOOD CULTURES WHEN MULTIPLE SETS ARE DRAWN IS UNCERTAIN. PLEASE NOTIFY THE MICROBIOLOGY DEPARTMENT WITHIN ONE WEEK IF SPECIATION AND SENSITIVITIES ARE REQUIRED.    Report Status 12/16/2017 FINAL  Final  Blood Culture ID Panel (Reflexed)     Status: Abnormal   Collection Time: 12/13/17 12:47 PM  Result Value Ref Range Status   Enterococcus species NOT DETECTED NOT DETECTED  Final   Listeria monocytogenes NOT DETECTED NOT DETECTED Final   Staphylococcus species DETECTED (A) NOT DETECTED Final    Comment: Methicillin (oxacillin) resistant coagulase negative staphylococcus. Possible blood culture contaminant (unless isolated from more than one blood culture draw or clinical case suggests pathogenicity). No antibiotic treatment is indicated for blood  culture contaminants. CRITICAL RESULT CALLED TO, READ BACK BY  AND VERIFIED WITH: Melven Sartorius PHARMD [id number] 12/14/17 A BROWNING    Staphylococcus aureus NOT DETECTED NOT DETECTED Final   Methicillin resistance DETECTED (A) NOT DETECTED Final    Comment: CRITICAL RESULT CALLED TO, READ BACK BY AND VERIFIED WITH: Melven Sartorius PHARMD [id number] 12/14/17 A BROWNING    Streptococcus species NOT DETECTED NOT DETECTED Final   Streptococcus agalactiae NOT DETECTED NOT DETECTED Final   Streptococcus pneumoniae NOT DETECTED NOT DETECTED Final   Streptococcus pyogenes NOT DETECTED NOT DETECTED Final   Acinetobacter baumannii NOT DETECTED NOT DETECTED Final   Enterobacteriaceae species NOT DETECTED NOT DETECTED Final   Enterobacter cloacae complex NOT DETECTED NOT DETECTED Final   Escherichia coli NOT DETECTED NOT DETECTED Final   Klebsiella oxytoca NOT DETECTED NOT DETECTED Final   Klebsiella pneumoniae NOT DETECTED NOT DETECTED Final   Proteus species NOT DETECTED NOT DETECTED Final   Serratia marcescens NOT DETECTED NOT DETECTED Final   Haemophilus influenzae NOT DETECTED NOT DETECTED Final   Neisseria meningitidis NOT DETECTED NOT DETECTED Final   Pseudomonas aeruginosa NOT DETECTED NOT DETECTED Final   Candida albicans NOT DETECTED NOT DETECTED Final   Candida glabrata NOT DETECTED NOT DETECTED Final   Candida krusei NOT DETECTED NOT DETECTED Final   Candida parapsilosis NOT DETECTED NOT DETECTED Final   Candida tropicalis NOT DETECTED NOT DETECTED Final  MRSA PCR Screening     Status: None   Collection Time: 12/13/17  4:44 PM   Result Value Ref Range Status   MRSA by PCR NEGATIVE NEGATIVE Final    Comment:        The GeneXpert MRSA Assay (FDA approved for NASAL specimens only), is one component of a comprehensive MRSA colonization surveillance program. It is not intended to diagnose MRSA infection nor to guide or monitor treatment for MRSA infections.          Radiology Studies: No results found.      Scheduled Meds: . feeding supplement (ENSURE ENLIVE)  237 mL Oral BID BM  . folic acid  1 mg Oral Daily  . lamoTRIgine  25 mg Oral Daily  . LORazepam  1 mg Oral TID  . metoprolol tartrate  2.5 mg Intravenous Q6H  . multivitamin with minerals  1 tablet Oral Daily  . nicotine  21 mg Transdermal Daily  . pantoprazole  40 mg Oral BID AC  . sucralfate  1 g Oral TID WC & HS  . thiamine  100 mg Oral Daily  . venlafaxine  37.5 mg Oral BID WC   Continuous Infusions: . sodium chloride Stopped (12/15/17 2100)      Shon Hale, MD [facility] Pager [phone]   If 7PM-7AM, please contact night-coverage [email or web address] Password [facility] 12/16/2017, 3:38 PM

## 2017-12-16 NOTE — Progress Notes (Addendum)
Nutrition Follow-up  DOCUMENTATION CODES:   Non-severe (moderate) malnutrition in context of social or environmental circumstances  INTERVENTION:   -Continue MVI daily -Continue Ensure Enlive po BID, each supplement provides 350 kcal and 20 grams of protein -Recommend monitor Mg, K, and Phos daily x 3 days and replete as necessary due to high refeeding risk  NUTRITION DIAGNOSIS:   Moderate Malnutrition related to chronic illness(anorexia nervosa) as evidenced by mild muscle depletion, mild fat depletion, energy intake < or equal to 75% for > or equal to 1 month.  Ongoing  GOAL:   Patient will meet greater than or equal to 90% of their needs  Progressing  MONITOR:   PO intake, Supplement acceptance, Labs, Weight trends, Skin, I & O's  REASON FOR ASSESSMENT:   Consult Assessment of nutrition requirement/status  ASSESSMENT:   36 y/o female PMHx PTSD, Depression, anxiety, anorexia nervosa, htn, etoh abuse. Presented to ED w/ severe N/V. In ED, suffered cardiac arrest due to vtach/fib. Received CPR and defib with ROSC. Admitted for management.   Pt receiving nursing care at time of visit.   Noted pt with unopened yogurt and fruit smoothie at bedside (consumed about 25%). Pt consuming approximately 50% of meals.   Case discussed with RN, who reports that pt has had a fair to poor appetite. Per RN, pt does like the Ensure supplements and did accept them yesterday. Pt refused morning dose of Ensure today because her husband brought her in a fruit smoothie.   Reviewed psych note from 12/15/17; pt does not meet criteria for psychiatric admission. CSW has been consulted for resources.   Labs reviewed: Mg: 2.9.   Diet Order:  Diet regular Room service appropriate? Yes; Fluid consistency: Thin  EDUCATION NEEDS:   No education needs have been identified at this time  Skin:  Skin Assessment: Reviewed RN Assessment  Last BM:  12/15/17  Height:   Ht Readings from Last 1  Encounters:  12/13/17 5\' 5"  (1.651 m)    Weight:   Wt Readings from Last 1 Encounters:  12/13/17 120 lb (54.4 kg)    Ideal Body Weight:  56.82 kg  BMI:  Body mass index is 19.97 kg/m.  Estimated Nutritional Needs:   Kcal:  1650-1900 kcals (30-35 kcal/kg bw)  Protein:  70-82g Pro (1.3-1.5 g/kg bw)  Fluid:  >1.9 L (35 ml/kg bw)    Teigen Bellin A. Mayford KnifeWilliams, RD, LDN, CDE Pager: [phone] After hours Pager: [phone]

## 2017-12-16 NOTE — Plan of Care (Signed)
  Progressing Education: Knowledge of General Education information will improve 12/16/2017 0038 - Progressing by Joycelyn ManHimes, Savio Albrecht M, RN Clinical Measurements: Ability to maintain clinical measurements within normal limits will improve 12/16/2017 0038 - Progressing by Joycelyn ManHimes, Daquavion Catala M, RN Will remain free from infection 12/16/2017 0038 - Progressing by Joycelyn ManHimes, Stephannie Broner M, RN Diagnostic test results will improve 12/16/2017 0038 - Progressing by Joycelyn ManHimes, Jearlene Bridwell M, RN Cardiovascular complication will be avoided 12/16/2017 0038 - Progressing by Joycelyn ManHimes, Zayin Valadez M, RN Activity: Risk for activity intolerance will decrease 12/16/2017 0038 - Progressing by Joycelyn ManHimes, Zimri Brennen M, RN Nutrition: Adequate nutrition will be maintained 12/16/2017 0038 - Progressing by Joycelyn ManHimes, Airabella Barley M, RN Coping: Level of anxiety will decrease 12/16/2017 0038 - Progressing by Joycelyn ManHimes, Kanyia Heaslip M, RN Pain Managment: General experience of comfort will improve 12/16/2017 0038 - Progressing by Joycelyn ManHimes, Jamion Carter M, RN

## 2017-12-16 NOTE — Consult Note (Addendum)
Cardiology Consultation:   Patient ID: Gina BoehringerJenny B Vanputten; [id number]; 1981-10-26   Admit date: 12/13/2017 Date of Consult: 12/16/2017  Primary Care Provider: Truman HaywardStarkes, Takia S, FNP Primary Cardiologist: New   Patient Profile:   Gina Daniels is a 36 y.o. female with a significant phychiatric history, including PTSD, Anxiety Depression, Anorexia Nervosa, as well as ETOH/ polysubstance abuse, who is being seen today for the evaluation of prolonged QT, at the request of Dr. Mariea ClontsEmokpae, Internal Medicine.  History of Present Illness:   Pt presented to the ED on 12/13/2017 with complaint of severe nausea/ vomiting w/ non bloody bilious emesis. Pt with h/o anorexia nervosa and heavy ETOH use. Reported drinking liquor every day but quit 3-4 days prior to admission given significant nausea and vomiting.   While in the ED, she had cardiac arrest due to Vtach/Afib and [facility] EKG in the ED showed prolonged QTc of 620 ms. She was hypokalemic with K of 2.5 and also with hypomagnesemia at 1.0. Electrolyte supplementation was given. Today, K is 3.5 and Mg is 1.6. F/u EKG today shows some improvement in QT/QTc, now at 452/561 ms, however new inferolateral TWIs noted. No recurrent VT on telemetry. Echo on 12/15/17 showed normal LVEF at 55-60%. Wall motion normal. Mild MR noted. Meds include PPI. She also tested positive for opiates on UDS. Levaquin was also initially given due to concern for aspiration PNA.   Cardiac risk factors include family h/o heart disease. She notes her father had ischemic HD, multiple MIs and severe HF requiring an LVAD. He died in 2014. She also notes personal h/o tobacco abuse and HTN.   She is feeling better. No further vomiting. Main complaint now is weakness. She has SOB with activity. Also occasional exertional chest tightness. No resting symptoms.   Past Medical History:  Diagnosis Date  . Anorexia   . Anorexia nervosa   . Anxiety   .  Depression   . 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 Gallstones   . Headache   . Hypertension   . PTSD (post-traumatic stress disorder)     Past Surgical History:  Procedure Laterality Date  . CHALAZION EXCISION     2012 LEFT EYE  . FOOT SURGERY     RIGHT   . INCISION AND DRAINAGE Right 10/05/2014   Procedure: INCISION AND DRAINAGE RIGHT FOOT WOUND WITH APPLICATION OF WOUND VAC;  Surgeon: Toni ArthursJohn Hewitt, MD;  Location: [facility] OR;  Service: Orthopedics;  Laterality: Right;  . ORIF ULNAR FRACTURE Left 08/22/2016   Procedure: OPEN REDUCTION INTERNAL FIXATION (ORIF)  LEFT ULNA FRACTURE;  Surgeon: Tarry KosNaiping M Xu, MD;  Location: [facility] OR;  Service: Orthopedics;  Laterality: Left;  . TUBAL LIGATION  2002  . tubal reversal     02/2014  . WISDOM TOOTH EXTRACTION  2002     Home Medications:  Prior to Admission medications   Medication Sig Start Date End Date Taking? Authorizing Provider  hydrOXYzine (ATARAX/VISTARIL) 50 MG tablet Take 1 tablet (50 mg total) by mouth at bedtime as needed (sleep). Patient not taking: Reported on 12/13/2017 10/22/16   Beau FannyWithrow, John C, FNP  lamoTRIgine (LAMICTAL) 100 MG tablet Take 1 tablet (100 mg total) by mouth 2 (two) times daily. Patient not taking: Reported on 12/13/2017 10/22/16   Beau FannyWithrow, John C, FNP  naltrexone (DEPADE) 50 MG tablet Take 0.5 tablets (25 mg total) by mouth daily. Patient not taking: Reported on 12/13/2017 10/23/16  Withrow, Everardo AllJohn C, FNP  nicotine (NICODERM CQ - DOSED IN MG/24 HOURS) 21 mg/24hr patch Place 1 patch (21 mg total) onto the skin daily. Patient not taking: Reported on 12/13/2017 10/23/16   Beau FannyWithrow, John C, FNP  ondansetron (ZOFRAN ODT) 4 MG disintegrating tablet Take 1 tablet (4 mg total) by mouth every 8 (eight) hours as needed for nausea or vomiting. Patient not taking: Reported on 12/13/2017 08/19/17   Dietrich PatesKhatri, Hina, PA-C  prazosin (MINIPRESS) 2 MG capsule Take 2 capsules (4 mg total) by mouth at bedtime. Patient  not taking: Reported on 12/13/2017 10/22/16   Beau FannyWithrow, John C, FNP  thiamine 100 MG tablet Take 1 tablet (100 mg total) by mouth daily. Patient not taking: Reported on 12/13/2017 10/23/16   Beau FannyWithrow, John C, FNP  traZODone (DESYREL) 50 MG tablet Take 1 tablet (50 mg total) by mouth at bedtime as needed for sleep. Patient not taking: Reported on 12/13/2017 10/22/16   Beau FannyWithrow, John C, FNP  venlafaxine XR (EFFEXOR-XR) 150 MG 24 hr capsule Take 1 capsule (150 mg total) by mouth daily. With one 37.5mg  capsule Patient not taking: Reported on 12/13/2017 10/23/16   Beau FannyWithrow, John C, FNP  venlafaxine XR (EFFEXOR-XR) 37.5 MG 24 hr capsule Take 1 capsule (37.5 mg total) by mouth daily with breakfast. With one 150mg  capsule Patient not taking: Reported on 12/13/2017 10/23/16   Beau FannyWithrow, John C, FNP    Inpatient Medications: Scheduled Meds: . feeding supplement (ENSURE ENLIVE)  237 mL Oral BID BM  . folic acid  1 mg Oral Daily  . lamoTRIgine  25 mg Oral Daily  . LORazepam  1 mg Oral TID  . metoprolol tartrate  12.5 mg Oral TID  . multivitamin with minerals  1 tablet Oral Daily  . nicotine  21 mg Transdermal Daily  . pantoprazole  40 mg Oral BID AC  . sucralfate  1 g Oral TID WC & HS  . thiamine  100 mg Oral Daily  . venlafaxine  37.5 mg Oral BID WC   Continuous Infusions: . sodium chloride Stopped (12/15/17 2100)   PRN Meds: acetaminophen, hydrOXYzine, loperamide, ondansetron (ZOFRAN) IV  Allergies:    Allergies  Allergen Reactions  . Latex Anaphylaxis  . Penicillins Anaphylaxis    Has patient had a PCN reaction causing immediate rash, facial/tongue/throat swelling, SOB or lightheadedness with hypotension:  yes Has patient had a PCN reaction causing severe rash involving mucus membranes or skin necrosis: no Has patient had a PCN reaction that required hospitalization: yes Has patient had a PCN reaction occurring within the last 10 years: yes If all of the above answers are "NO", then may  proceed with Cephalosporin use.   . Codeine Hives and Rash  . Sulfa Antibiotics Hives  . Morphine And Related Rash  . Tape Rash    Social History:   Social History   Socioeconomic History  . Marital status: Married    Spouse name: Not on file  . Number of children: Not on file  . Years of education: Not on file  . Highest education level: Not on file  Social Needs  . Financial resource strain: Not on file  . Food insecurity - worry: Not on file  . Food insecurity - inability: Not on file  . Transportation needs - medical: Not on file  . Transportation needs - non-medical: Not on file  Occupational History  . Not on file  Tobacco Use  . Smoking status: Current Every Day Smoker    Packs/day:  1.00    Years: 21.00    Pack years: 21.00    Types: Cigarettes  . Smokeless tobacco: Never Used  Substance and Sexual Activity  . Alcohol use: Yes    Comment: OCC  . Drug use: Yes    Types: Marijuana    Comment: daily  . Sexual activity: Yes    Birth control/protection: Other-see comments  Other Topics Concern  . Not on file  Social History Narrative  . Not on file    Family History:    Family History  Problem Relation Age of Onset  . Hypertension Mother   . Hypertension Father   . Heart attack Father   . Diabetes Maternal Grandmother      ROS:  Please see the history of present illness.  ROS  All other ROS reviewed and negative.     Physical Exam/Data:   Vitals:   12/16/17 0821 12/16/17 1219 12/16/17 1400 12/16/17 1537  BP: (!) 149/95 112/66 (!) 146/92   Pulse: 97 (!) 105    Resp: 18 18 (!) 31   Temp:  98.1 F (36.7 C)  99.2 F (37.3 C)  TempSrc:  Oral  Oral  SpO2: 100% 100%    Weight:      Height:        Intake/Output Summary (Last 24 hours) at 12/16/2017 1552 Last data filed at 12/16/2017 1300 Gross per 24 hour  Intake 2896 ml  Output -  Net 2896 ml   Filed Weights   12/13/17 1003  Weight: 120 lb (54.4 kg)   Body mass index is 19.97 kg/m.    General:  Thin appearing. No acute distress HEENT: normal Lymph: no adenopathy Neck: no JVD Endocrine:  No thryomegaly Vascular: No carotid bruits; FA pulses 2+ bilaterally without bruits  Cardiac:  normal S1, S2; RRR; no murmur  Lungs:  clear to auscultation bilaterally, no wheezing, rhonchi or rales  Abd: soft, nontender, no hepatomegaly  Ext: no edema Musculoskeletal:  No deformities, BUE and BLE strength normal and equal Skin: warm and dry  Neuro:  CNs 2-12 intact, no focal abnormalities noted Psych:  Normal affect  EKG:  The EKG was personally reviewed and demonstrates:  NSR with LVH and TWIs in inferolateral leads. Prolonged QT/QTc 452/561 ms.  Telemetry:  Telemetry was personally reviewed and demonstrates:  NSR  Relevant CV Studies: 2D echo 12/15/17 Study Conclusions  - Left ventricle: The cavity size was normal. Wall thickness was   normal. Systolic function was normal. The estimated ejection   fraction was in the range of 55% to 60%. Wall motion was normal;   there were no regional wall motion abnormalities. Left   ventricular diastolic function parameters were normal. - Mitral valve: There was mild regurgitation.  Laboratory Data:  Chemistry Recent Labs  Lab 12/15/17 0253 12/15/17 1545 12/16/17 0206 12/16/17 0750  NA 132*  --  137 138  K 3.2* 3.6 3.5 3.5  CL 105  --  108 107  CO2 23  --  22 24  GLUCOSE 104*  --  99 97  BUN <5*  --  <5* <5*  CREATININE 0.56  --  0.41* 0.53  CALCIUM 7.5*  --  8.0* 8.1*  GFRNONAA >60  --  >60 >60  GFRAA >60  --  >60 >60  ANIONGAP 4*  --  7 7    Recent Labs  Lab 12/13/17 1004 12/16/17 0750  PROT 7.0 5.3*  ALBUMIN 3.7 2.9*  AST 61* 30  ALT 28  27  ALKPHOS 61 47  BILITOT 2.6* 0.9   Hematology Recent Labs  Lab 12/13/17 1004 12/15/17 0253  WBC 20.4* 9.0  RBC 4.81 3.09*  HGB 18.3* 11.4*  HCT 48.4* 32.1*  MCV 100.6* 103.9*  MCH 38.0* 36.9*  MCHC 37.8* 35.5  RDW 14.2 14.1  PLT 191 114*   Cardiac EnzymesNo  results for input(s): TROPONINI in the last 168 hours.  Recent Labs  Lab 12/13/17 1259  TROPIPOC 0.00    BNPNo results for input(s): BNP, PROBNP in the last 168 hours.  DDimer No results for input(s): DDIMER in the last 168 hours.  Radiology/Studies:  Ct Head Wo Contrast  Result Date: 12/13/2017 CLINICAL DATA:  Seizure.  Alcohol abuse disorder. EXAM: CT HEAD WITHOUT CONTRAST TECHNIQUE: Contiguous axial images were obtained from the base of the skull through the vertex without intravenous contrast. COMPARISON:  None. FINDINGS: Brain: Premature for age cerebral and cerebellar atrophy. No evidence for acute infarction, hemorrhage, mass lesion, hydrocephalus, or extra-axial fluid. No definite white matter disease. Vascular:  Negative. Skull: Negative. Sinuses/Orbits: Negative. Other: Negative. IMPRESSION: Premature for age atrophy.  No acute intracranial findings. Electronically Signed   By: Elsie Stain M.D.   On: 12/13/2017 13:50   Ct Abdomen Pelvis W Contrast  Result Date: 12/13/2017 CLINICAL DATA:  Nausea and vomiting. Anorexia nervosa. Smoker. Alcohol abuse. EXAM: CT ABDOMEN AND PELVIS WITH CONTRAST TECHNIQUE: Multidetector CT imaging of the abdomen and pelvis was performed using the standard protocol following bolus administration of intravenous contrast. CONTRAST:  ISOVUE-300 IOPAMIDOL (ISOVUE-300) INJECTION 61% COMPARISON:  08/19/2017 FINDINGS: Lower chest: Interval multiple focal areas of patchy opacity in the left lower lobe. Hepatobiliary: Diffuse low density of the liver relative to the spleen without significant change. Unremarkable gallbladder. Pancreas: Unremarkable. No pancreatic ductal dilatation or surrounding inflammatory changes. Spleen: Normal in size without focal abnormality. Adrenals/Urinary Tract: Unremarkable adrenal glands. Stable left renal cyst. Unremarkable right kidney, ureters and urinary bladder. Stomach/Bowel: Stomach is within normal limits. Appendix appears  normal. No evidence of bowel wall thickening, distention, or inflammatory changes. Vascular/Lymphatic: No significant vascular findings are present. No enlarged abdominal or pelvic lymph nodes. Reproductive: Uterus and bilateral adnexa are unremarkable. Tampon in the vagina. Other: Small umbilical hernia containing fat. Musculoskeletal: Mild degenerative changes at the L5-S1 level. IMPRESSION: 1. Interval multiple focal patchy opacities in the left lower lobe, suspicious for pneumonia or aspiration pneumonitis. 2. Stable diffuse hepatic steatosis. Electronically Signed   By: Beckie Salts M.D.   On: 12/13/2017 13:53   Dg Chest Port 1 View  Result Date: 12/13/2017 CLINICAL DATA:  Three-day history of generalized chest pain, nausea and vomiting, and diarrhea. Metabolic acidosis. EXAM: PORTABLE CHEST 1 VIEW COMPARISON:  10/04/2014, 08/15/2009 and earlier. FINDINGS: External pacing pads are present. Cardiomediastinal silhouette unremarkable for AP portable technique, unchanged. Lungs clear. Bronchovascular markings normal. Pulmonary vascularity normal. No visible pleural effusions. No pneumothorax. IMPRESSION: No acute cardiopulmonary disease. Electronically Signed   By: Hulan Saas M.D.   On: 12/13/2017 14:07    Assessment and Plan:   1. Prolonged QT w/ Subsequent VT and Cardiac Arrest: ROSC after CPR and defibrillation. In the setting of electrolyte abnormalities with hypokalemia at 2.5 and hypomagnesemia at 1.0, subsequent to severe vomiting (anorexia nervosa and chronic ETOH use).  Initial ED EKG showed prolonged QT at 620 ms. Electrolyte supplementation given. Electrolytes improved. K today at 3.5 and Mg at 1.6. QT interval improved. QT/QTc today at 452/561 ms. 2D echo with normal LVEF. No recurrent  VT on telemetry.  Continue to keep electrolytes WNL and avoid QT prolonging drugs. Continue to monitor. Given risk factors and strong family h/o ischemic HD as well as personal h/o HTN and tobacco use,  exertional CP and dyspnea, may consider coronary CTA at some point.  MD to follow with further recs.    For questions or updates, please contact [facility] Please consult [email or web address] for contact info under Cardiology/STEMI.   Signed, Robbie Lis, PA-C  12/16/2017 3:52 PM   Personally seen and examined. Agree with above. 36 year old female status post cardiac arrest in the emergency department in the setting of magnesium of 1.0 and potassium of 2.5 subsequent to vomiting, has a history of anorexia nervosa and chronic alcohol use.  Currently quite somber, crying at times.  Feels minimal chest tightness.  Heart regular rate and rhythm no murmurs, lungs are clear to auscultation, no edema.  Multiple tattoos noted.  Somber mood.  Multiple EKGs reviewed.  T wave abnormalities noted with QTc today 561 ms.  Prior QTc in August.  Prolonged QT/abnormal EKG/V. tach arrest in emergency department -Obviously the electrolyte abnormalities were contributing significantly to her QT interval.  She is post arrest.  Continuing to maintain magnesium greater than 2 and potassium greater than 4.  She is taking p.o. potassium.  She states that the IV was quite painful.  Echocardiogram thankfully shows normal ejection fraction.  No wall motion abnormalities.  I think it would be helpful to have a EP see her tomorrow.  Question is whether or not a defibrillator should be implanted given her propensity for vomiting as she states 3 out of the 4 weeks of the month leading to subsequent electrolyte abnormalities.  I asked her if a gastroenterologist had any answers for her and she shook her head no.  She has seen nutrition in the past because of eating disorder, nausea vomiting, notes noted in 2016.  Depressed as well.  Try to avoid QT prolonging agents such as fluoroquinolones (Levaquin).   Psychiatry has seen her as well yesterday.  Recommended restarting her Effexor 37.5 mg twice a day, Lamictal 25 mg daily and  Vistaril 25 mg 3 times a day as needed for anxiety.  Donato Schultz, MD

## 2017-12-17 DIAGNOSIS — E876 Hypokalemia: Secondary | ICD-10-CM | POA: Diagnosis present

## 2017-12-17 DIAGNOSIS — I4581 Long QT syndrome: Secondary | ICD-10-CM | POA: Diagnosis present

## 2017-12-17 LAB — BASIC METABOLIC PANEL
Anion gap: 7 (ref 5–15)
CALCIUM: 8.1 mg/dL — AB (ref 8.9–10.3)
CO2: 22 mmol/L (ref 22–32)
CREATININE: 0.61 mg/dL (ref 0.44–1.00)
Chloride: 108 mmol/L (ref 101–111)
Glucose, Bld: 107 mg/dL — ABNORMAL HIGH (ref 65–99)
Potassium: 3.4 mmol/L — ABNORMAL LOW (ref 3.5–5.1)
SODIUM: 137 mmol/L (ref 135–145)

## 2017-12-17 LAB — CBC
HEMATOCRIT: 35.4 % — AB (ref 36.0–46.0)
Hemoglobin: 12.3 g/dL (ref 12.0–15.0)
MCH: 37 pg — ABNORMAL HIGH (ref 26.0–34.0)
MCHC: 34.7 g/dL (ref 30.0–36.0)
MCV: 106.6 fL — ABNORMAL HIGH (ref 78.0–100.0)
PLATELETS: 157 10*3/uL (ref 150–400)
RBC: 3.32 MIL/uL — ABNORMAL LOW (ref 3.87–5.11)
RDW: 14.4 % (ref 11.5–15.5)
WBC: 8.9 10*3/uL (ref 4.0–10.5)

## 2017-12-17 LAB — MAGNESIUM: MAGNESIUM: 1.6 mg/dL — AB (ref 1.7–2.4)

## 2017-12-17 MED ORDER — POTASSIUM CHLORIDE CRYS ER 20 MEQ PO TBCR
40.0000 meq | EXTENDED_RELEASE_TABLET | Freq: Once | ORAL | Status: AC
  Administered 2017-12-17: 40 meq via ORAL
  Filled 2017-12-17: qty 2

## 2017-12-17 MED ORDER — MAGNESIUM SULFATE 2 GM/50ML IV SOLN
2.0000 g | Freq: Once | INTRAVENOUS | Status: AC
  Administered 2017-12-17: 2 g via INTRAVENOUS
  Filled 2017-12-17: qty 50

## 2017-12-17 MED ORDER — PROPRANOLOL HCL ER 60 MG PO CP24
60.0000 mg | ORAL_CAPSULE | Freq: Every day | ORAL | Status: DC
  Filled 2017-12-17: qty 1

## 2017-12-17 MED ORDER — METOPROLOL TARTRATE 25 MG PO TABS: 25.0000 mg | ORAL_TABLET | Freq: Three times a day (TID) | ORAL | Status: DC

## 2017-12-17 MED ORDER — POTASSIUM CHLORIDE CRYS ER 20 MEQ PO TBCR
20.0000 meq | EXTENDED_RELEASE_TABLET | Freq: Once | ORAL | Status: AC
  Administered 2017-12-17: 20 meq via ORAL
  Filled 2017-12-17: qty 1

## 2017-12-17 MED ORDER — HYDROXYZINE HCL 25 MG PO TABS
25.0000 mg | ORAL_TABLET | Freq: Every day | ORAL | Status: DC
  Administered 2017-12-17: 25 mg via ORAL
  Filled 2017-12-17: qty 1

## 2017-12-17 NOTE — Progress Notes (Signed)
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  She reported drinking liquor every day, but quit 3-4 days prior to this admit due to significant nausea and vomiting.    In the ED she was found to have a WBC 20, potassium 2.5, magnesium 1, creatinine 1.3, total bili 2.6, AST 61.  CT Abd/pelvis without acute abdominal abnormality but w/ patchy opacities in the lungs.  She was Guaiac positive, and started on Protonix bolus and drip in the ED.  EKG in the ED noted prolonged QTc 620. While she is in the ED, she had cardiac arrest due to Vtach/Afib, she received cpr and s/p defib with RSOC. She is doing better, EDP discussed with critical care who declined admission.    Subjective:   A/O 4, negative SOB, neg CP ,  States consumes 7 shots of liquor per day had done this up till 12/10/17  when she discontinued abruptly.  States has had vomiting for 3 months. Smokes a bowl of marijuana per day secondary to not being able to obtain psychiatric medication. States has not seen a psychiatrist in 2 years secondary to insurance problems. No new Complaints at this time, cooperative, her significant other is at bedside, questions answered    Assessment & Plan:   Active Problems:   Cardiac arrest with ventricular fibrillation (HCC)  1)V. Fib/Vtach Arrest in ED -on admission QTc was 620, magnesium was 1.0 with a potassium of 2.6 -CPR and Defib>>> ROSC -Echocardiogram with preserved EF of 55-60%, no significant wall motion abnormalities   -Avoid all QT prolonging medication, EKG on 12/16/2017 with a QTc of 561. Cardiology consult from Dr. Anne FuSkains and Dr. Sharrell [name] appreciated, ???  If patient has  underlying long QT syndrome at baseline, exacerbated by electrolyte abnormalities in the setting of alcohol abuse, GI losses and nutritional marasms.  Potassium and magnesium are low again today we will replace and recheck, propanolol as ordered by cardiologist  2)Sinus tachycardia/HTN-stable,  start p.o. propanolol for rate control and BP control, and for underlying long QT syndrome  3)Prolonged QT interval- EKG on 12/16/2017 with a QTc of 561, despite correcting electrolyte abnormalities, cardiology consult appreciated echocardiogram as noted above, please see #1 above  4)FEN/Hypokalemia/hypomagnesemia and hyponatremia-continue to replace and monitor electrolytes , electrolyte abnormalities most likely related to alcohol abuse and nutritional marasms in the setting of anorexia, please see #1 above  5)Aspiration Pneumonia vs Pneumonitis - -Negative fever, negative leukocytosis, negative bands, negative left shift hold on antibiotics , from a respiratory standpoint patient is doing well   6) coag negative staph in 1 of 1 blood cx- Unfortunately it appears only 1 blood cx was obtained on 12/13/17,  - this is suspicious for contamination, but pt is also at risk of potential bacteremia , currently off antibiotics, repeat blood cultures from 12/16/2017 negative so far   7)Transaminitis - Fatty liver - Secondary to EtOH abuse, acute viral hepatitis panel is negative  8)EtOH abuse/Polysubstance abuse- -12/22 UDS positive for marijuana, no evidence of frank DTs at this time last alcoholic intake was 10/10/2017, c/n lorazepam to 1 mg 3 times daily as needed, continue thiamine and folic acid  9)Tobacco abuse-tobacco cessation advised, nicotine patches prescribed  10)Severe protein calorie  malnutrition-nutritional consult requested, BMI is around 20, history of anorexia  11)PTSD, Anxiety/Depression, Anorexia Nervosa- psychiatric  consult appreciated, advised to c/n Effexor 37.5 mg twice daily, Lamictal 25  mg daily and Vistaril 25 mg 3 times daily as needed  DVT prophylaxis: SCD Code Status: Full Family Communication: None Disposition Plan: home in 1-2 days if cleared by psychiatry and cardiology   Consultants:  Psychiatry and cardiology   Procedures/Significant Events:  12/21 CT head WO contrast: Negative acute findings 12/21 CT abdomen pelvis with contrast:-Interval multiple focal patchy opacities in the left lower lobe, suspicious for pneumonia or aspiration pneumonitis. -Stable diffuse hepatic steatosis. Echocardiogram    VENTILATOR SETTINGS: N/A  Cultures-coagulase-negative staph from blood culture obtained on 12/13/2017   Antimicrobials: Anti-infectives (From admission, onward)   Start     Stop   12/14/17 1500  levofloxacin (LEVAQUIN) IVPB 750 mg  Status:  Discontinued     12/13/17 1705   12/14/17 1400  metroNIDAZOLE (FLAGYL) IVPB 500 mg  Status:  Discontinued     12/14/17 1145   12/14/17 0700  vancomycin (VANCOCIN) IVPB 750 mg/150 ml premix  Status:  Discontinued     12/14/17 1145   12/14/17 0400  vancomycin (VANCOCIN) IVPB 750 mg/150 ml premix  Status:  Discontinued     12/13/17 1644   12/13/17 1730  aztreonam (AZACTAM) 2 g in dextrose 5 % 50 mL IVPB  Status:  Discontinued     12/14/17 1143   12/13/17 1430  levofloxacin (LEVAQUIN) IVPB 750 mg     12/13/17 1624   12/13/17 1430  vancomycin (VANCOCIN) IVPB 1000 mg/200 mL premix     12/13/17 1554      Continuous Infusions: . sodium chloride 75 mL/hr at 12/17/17 0439     Objective: Vitals:   12/17/17 0342 12/17/17 0818 12/17/17 0831 12/17/17 1218  BP: (!) 156/98 (!) 148/96  (!) 147/98  Pulse: (!) 110 (!) 106 (!) 112 97  Resp: 16  (!) 26   Temp: 98.1 F (36.7 C)  98.4 F (36.9 C) 98.2 F (36.8 C)  TempSrc: Oral  Oral Oral  SpO2: 99%  99% 100%  Weight: 53.9 kg (118 lb 12.8 oz)     Height:        Intake/Output Summary (Last 24 hours) at 12/17/2017 1522 Last data filed at 12/17/2017 6213 Gross per 24  hour  Intake 1260 ml  Output 500 ml  Net 760 ml   Filed Weights   12/13/17 1003 12/16/17 1708 12/17/17 0342  Weight: 54.4 kg (120 lb) 52.6 kg (116 lb) 53.9 kg (118 lb 12.8 oz)    Examination:    General: A/O 4, No acute respiratory distress HEENT:patient without teeth states has dentures cannot use them secondary to continuing bone spurs Neck:  Negative  JVD Lungs: Clear to auscultation bilaterally without wheezes or crackles Cardiovascular: Tachycardic, Regular rhythm , normal S1 and S2 Abdomen: negative abdominal pain, nondistended, positive soft, bowel sounds, Extremities: No  edema  Skin: Negative rashes Psychiatric:  Positive anxiety,  negative mania, denies suicidal or homicidal ideation or plan Central nervous system: No significant tremors or new focal deficits at this time   CBC: Recent Labs  Lab 12/13/17 1004 12/15/17 0253 12/17/17 0358  WBC 20.4* 9.0 8.9  HGB 18.3* 11.4* 12.3  HCT 48.4* 32.1* 35.4*  MCV 100.6* 103.9* 106.6*  PLT 191 114* 157   Basic Metabolic Panel: Recent Labs  Lab 12/14/17 0057 12/14/17 1423 12/15/17 0253 12/15/17 1545 12/16/17 0206 12/16/17 0750 12/17/17  0358  NA 130* 131* 132*  --  137 138 137  K 2.2* 2.6* 3.2* 3.6 3.5 3.5 3.4*  CL 94* 98* 105  --  108 107 108  CO2 26 24 23   --  22 24 22   GLUCOSE 76 76 104*  --  99 97 107*  BUN 5* <5* <5*  --  <5* <5* <5*  CREATININE 0.78 0.57 0.56  --  0.41* 0.53 0.61  CALCIUM 7.2* 7.4* 7.5*  --  8.0* 8.1* 8.1*  MG 1.9  --  1.4* 1.6* 2.9*  --  1.6*   GFR: Estimated Creatinine Clearance: 81.9 mL/min (by C-G formula based on SCr of 0.61 mg/dL). Liver Function Tests: Recent Labs  Lab 12/13/17 1004 12/16/17 0750  AST 61* 30  ALT 28 27  ALKPHOS 61 47  BILITOT 2.6* 0.9  PROT 7.0 5.3*  ALBUMIN 3.7 2.9*   Recent Labs  Lab 12/13/17 1004  LIPASE 21   No results for input(s): AMMONIA in the last 168 hours. Coagulation Profile: No results for input(s): INR, PROTIME in the last 168  hours. Cardiac Enzymes: No results for input(s): CKTOTAL, CKMB, CKMBINDEX, TROPONINI in the last 168 hours. BNP (last 3 results) No results for input(s): PROBNP in the last 8760 hours. HbA1C: No results for input(s): HGBA1C in the last 72 hours. CBG: No results for input(s): GLUCAP in the last 168 hours. Lipid Profile: No results for input(s): CHOL, HDL, LDLCALC, TRIG, CHOLHDL, LDLDIRECT in the last 72 hours. Thyroid Function Tests: No results for input(s): TSH, T4TOTAL, FREET4, T3FREE, THYROIDAB in the last 72 hours. Anemia Panel: No results for input(s): VITAMINB12, FOLATE, FERRITIN, TIBC, IRON, RETICCTPCT in the last 72 hours. Urine analysis:    Component Value Date/Time   COLORURINE YELLOW 08/19/2017 1926   APPEARANCEUR CLEAR 08/19/2017 1926   LABSPEC 1.020 08/19/2017 1926   PHURINE 6.0 08/19/2017 1926   GLUCOSEU NEGATIVE 08/19/2017 1926   HGBUR NEGATIVE 08/19/2017 1926   BILIRUBINUR MODERATE (A) 08/19/2017 1926   KETONESUR 80 (A) 08/19/2017 1926   PROTEINUR 30 (A) 08/19/2017 1926   UROBILINOGEN 0.2 04/30/2015 0224   NITRITE NEGATIVE 08/19/2017 1926   LEUKOCYTESUR NEGATIVE 08/19/2017 1926   Sepsis Labs: @LABRCNTIP (procalcitonin:4,lacticidven:4)  ) Recent Results (from the past 240 hour(s))  Blood culture (routine x 2)     Status: None (Preliminary result)   Collection Time: 12/13/17 12:20 PM  Result Value Ref Range Status   Specimen Description BLOOD LEFT ANTECUBITAL  Final   Special Requests   Final    BOTTLES DRAWN AEROBIC AND ANAEROBIC Blood Culture adequate volume   Culture NO GROWTH 4 DAYS  Final   Report Status PENDING  Incomplete  Blood culture (routine x 2)     Status: Abnormal   Collection Time: 12/13/17 12:47 PM  Result Value Ref Range Status   Specimen Description BLOOD LEFT WRIST  Final   Special Requests   Final    BOTTLES DRAWN AEROBIC AND ANAEROBIC Blood Culture adequate volume   Culture  Setup Time   Final    GRAM POSITIVE COCCI IN BOTH AEROBIC  AND ANAEROBIC BOTTLES CRITICAL RESULT CALLED TO, READ BACK BY AND VERIFIED WITH: J LEDFORD PHARMD [id number] 12/14/17 A BROWNING    Culture (A)  Final    STAPHYLOCOCCUS SPECIES (COAGULASE NEGATIVE) THE SIGNIFICANCE OF ISOLATING THIS ORGANISM FROM A SINGLE SET OF BLOOD CULTURES WHEN MULTIPLE SETS ARE DRAWN IS UNCERTAIN. PLEASE NOTIFY THE MICROBIOLOGY DEPARTMENT WITHIN ONE WEEK IF SPECIATION AND SENSITIVITIES ARE REQUIRED.  Report Status 12/16/2017 FINAL  Final  Blood Culture ID Panel (Reflexed)     Status: Abnormal   Collection Time: 12/13/17 12:47 PM  Result Value Ref Range Status   Enterococcus species NOT DETECTED NOT DETECTED Final   Listeria monocytogenes NOT DETECTED NOT DETECTED Final   Staphylococcus species DETECTED (A) NOT DETECTED Final    Comment: Methicillin (oxacillin) resistant coagulase negative staphylococcus. Possible blood culture contaminant (unless isolated from more than one blood culture draw or clinical case suggests pathogenicity). No antibiotic treatment is indicated for blood  culture contaminants. CRITICAL RESULT CALLED TO, READ BACK BY AND VERIFIED WITH: J LEDFORD PHARMD [id number] 12/14/17 A BROWNING    Staphylococcus aureus NOT DETECTED NOT DETECTED Final   Methicillin resistance DETECTED (A) NOT DETECTED Final    Comment: CRITICAL RESULT CALLED TO, READ BACK BY AND VERIFIED WITH: Melven SartoriusJ LEDFORD PHARMD [id number] 12/14/17 A BROWNING    Streptococcus species NOT DETECTED NOT DETECTED Final   Streptococcus agalactiae NOT DETECTED NOT DETECTED Final   Streptococcus pneumoniae NOT DETECTED NOT DETECTED Final   Streptococcus pyogenes NOT DETECTED NOT DETECTED Final   Acinetobacter baumannii NOT DETECTED NOT DETECTED Final   Enterobacteriaceae species NOT DETECTED NOT DETECTED Final   Enterobacter cloacae complex NOT DETECTED NOT DETECTED Final   Escherichia coli NOT DETECTED NOT DETECTED Final   Klebsiella oxytoca NOT DETECTED NOT DETECTED Final   Klebsiella pneumoniae NOT  DETECTED NOT DETECTED Final   Proteus species NOT DETECTED NOT DETECTED Final   Serratia marcescens NOT DETECTED NOT DETECTED Final   Haemophilus influenzae NOT DETECTED NOT DETECTED Final   Neisseria meningitidis NOT DETECTED NOT DETECTED Final   Pseudomonas aeruginosa NOT DETECTED NOT DETECTED Final   Candida albicans NOT DETECTED NOT DETECTED Final   Candida glabrata NOT DETECTED NOT DETECTED Final   Candida krusei NOT DETECTED NOT DETECTED Final   Candida parapsilosis NOT DETECTED NOT DETECTED Final   Candida tropicalis NOT DETECTED NOT DETECTED Final  MRSA PCR Screening     Status: None   Collection Time: 12/13/17  4:44 PM  Result Value Ref Range Status   MRSA by PCR NEGATIVE NEGATIVE Final    Comment:        The GeneXpert MRSA Assay (FDA approved for NASAL specimens only), is one component of a comprehensive MRSA colonization surveillance program. It is not intended to diagnose MRSA infection nor to guide or monitor treatment for MRSA infections.   Culture, blood (Routine X 2) w Reflex to ID Panel     Status: None (Preliminary result)   Collection Time: 12/16/17  3:59 PM  Result Value Ref Range Status   Specimen Description BLOOD LEFT HAND  Final   Special Requests IN PEDIATRIC BOTTLE Blood Culture adequate volume  Final   Culture NO GROWTH < 24 HOURS  Final   Report Status PENDING  Incomplete  Culture, blood (Routine X 2) w Reflex to ID Panel     Status: None (Preliminary result)   Collection Time: 12/16/17  3:59 PM  Result Value Ref Range Status   Specimen Description BLOOD RIGHT HAND  Final   Special Requests IN PEDIATRIC BOTTLE Blood Culture adequate volume  Final   Culture NO GROWTH < 24 HOURS  Final   Report Status PENDING  Incomplete         Radiology Studies: No results found.      Scheduled Meds: . feeding supplement (ENSURE ENLIVE)  237 mL Oral BID BM  . folic acid  1 mg Oral  Daily  . hydrOXYzine  25 mg Oral QHS  . lamoTRIgine  25 mg Oral Daily   . LORazepam  1 mg Oral TID  . multivitamin with minerals  1 tablet Oral Daily  . nicotine  21 mg Transdermal Daily  . pantoprazole  40 mg Oral BID AC  . [START ON 12/18/2017] propranolol ER  60 mg Oral Daily  . sucralfate  1 g Oral TID WC & HS  . thiamine  100 mg Oral Daily  . venlafaxine  37.5 mg Oral BID WC   Continuous Infusions: . sodium chloride 75 mL/hr at 12/17/17 0439      Shon Hale, MD [facility] Pager [phone]   If 7PM-7AM, please contact night-coverage [email or web address] Password [email or web address] 12/17/2017, 3:22 PM

## 2017-12-17 NOTE — Progress Notes (Signed)
Reason for Consult: Ventricular tachycardia in the setting of long QT  Referring Physician: Dr. Doren CustardSkains  Gina Daniels is an 36 y.o. female.   HPI: The patient is a 36 year old woman with a history of anxiety, depression, anorexia, polysubstance abuse, and PTSD.  She presented to the emergency department 4 days ago with severe nausea and vomiting, and prior heavy alcohol use.  The patient was noted to have a marked prolongation in her QT interval and sustained a ventricular tachycardia/fibrillation cardiac arrest.  Her potassium level was 2.5 and her magnesium was 1.0.  She was successfully resuscitated.  Her history was that she had been treated with quinolone antibiotics, specifically Levaquin.  Her electrolytes have been repleted and her arrhythmias have resolved.  Her QT interval continues to improve.  By echo she has preserved left ventricular systolic function.  PMH: Past Medical History:  Diagnosis Date  . Anorexia   . Anorexia nervosa   . Anxiety   . Depression   . 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 Gallstones   . Headache   . Hypertension   . PTSD (post-traumatic stress disorder)     PSHX: Past Surgical History:  Procedure Laterality Date  . CHALAZION EXCISION     2012 LEFT EYE  . FOOT SURGERY     RIGHT   . INCISION AND DRAINAGE Right 10/05/2014   Procedure: INCISION AND DRAINAGE RIGHT FOOT WOUND WITH APPLICATION OF WOUND VAC;  Surgeon: Toni ArthursJohn Hewitt, MD;  Location: [facility] OR;  Service: Orthopedics;  Laterality: Right;  . ORIF ULNAR FRACTURE Left 08/22/2016   Procedure: OPEN REDUCTION INTERNAL FIXATION (ORIF)  LEFT ULNA FRACTURE;  Surgeon: Tarry KosNaiping M Xu, MD;  Location: [facility] OR;  Service: Orthopedics;  Laterality: Left;  . TUBAL LIGATION  2002  . tubal reversal     02/2014  . WISDOM TOOTH EXTRACTION  2002    FAMHX: Family History  Problem Relation Age of Onset  . Hypertension Mother   . Hypertension Father   . Heart attack Father   . Diabetes  Maternal Grandmother     Social History:  reports that she has been smoking cigarettes.  She has a 21.00 pack-year smoking history. she has never used smokeless tobacco. She reports that she drinks alcohol. She reports that she uses drugs. Drug: Marijuana.  Allergies:  Allergies  Allergen Reactions  . Latex Anaphylaxis  . Penicillins Anaphylaxis    Has patient had a PCN reaction causing immediate rash, facial/tongue/throat swelling, SOB or lightheadedness with hypotension:  yes Has patient had a PCN reaction causing severe rash involving mucus membranes or skin necrosis: no Has patient had a PCN reaction that required hospitalization: yes Has patient had a PCN reaction occurring within the last 10 years: yes If all of the above answers are "NO", then may proceed with Cephalosporin use.   . Codeine Hives and Rash  . Sulfa Antibiotics Hives  . Morphine And Related Rash  . Tape Rash    Medications: I have reviewed the patient's current medications.  No results found.  ROS  As stated in the HPI and negative for all other systems.  Physical Exam  Vitals:Blood pressure (!) 148/96, pulse (!) 112, temperature 98.4 F (36.9 C), temperature source Oral, resp. rate (!) 26, height 5\' 5"  (1.651 m), weight 118 lb 12.8 oz (53.9 kg), last menstrual period 12/10/2017, SpO2 99 %.  Well appearing 36 year old woman, NAD HEENT: Unremarkable Neck: 6 cm JVD, no thyromegally Lymphatics:  No  adenopathy Back:  No CVA tenderness Lungs:  Clear, with no wheezes, rales, or rhonchi HEART:  Regular rate rhythm, no murmurs, no rubs, no clicks Abd:  Flat, positive bowel sounds, no organomegally, no rebound, no guarding Ext:  2 plus pulses, no edema, no cyanosis, no clubbing Skin:  No rashes no nodules Neuro:  CN II through XII intact, motor grossly intact  Telemetry -normal sinus rhythm  ECG -normal sinus rhythm with minimally prolonged QT interval  Assessment/Plan: 1.  Ventricular  tachycardia/fibrillation -her ventricular arrhythmias are a consequence of QT prolongation as a consequence of profound electrolyte abnormalities and quinolone antibiotics.  Most likely she has underlying long QT syndrome.  As her ventricular arrhythmias are considered preventable, and because she is not on any medical therapy, there is no indication for ICD insertion.  Repletion of electrolyte abnormalities, avoidance of all QT prolonging drugs, avoidance of all recreational drugs, and initiation of beta-blocker therapy are all indicated.  I would suggest propranolol or nadolol, with the latter being quite expensive. 2.  Persistent nausea, vomiting, and diarrhea -the patient has several episodes of diarrhea per day and states that this is been present for several months.  She denies seeing a gastroenterologist.  I would recommend she do so prior to discharge. 3.  Tobacco abuse -while an acute coronary syndrome is not the cause of her current problem, she does have a family history of heart disease, and is recommended to stop smoking.  Kristopher GleeGregg TaylorMD 12/17/2017, 9:28 AM    Patient ID: Gina Daniels, female   DOB: 1981/06/03, 36 y.o.   MRN: [id number]

## 2017-12-18 DIAGNOSIS — E440 Moderate protein-calorie malnutrition: Secondary | ICD-10-CM | POA: Diagnosis present

## 2017-12-18 DIAGNOSIS — I4581 Long QT syndrome: Secondary | ICD-10-CM

## 2017-12-18 LAB — BASIC METABOLIC PANEL
ANION GAP: 5 (ref 5–15)
BUN: <5 mg/dL — ABNORMAL LOW (ref 6–20)
CALCIUM: 8.4 mg/dL — AB (ref 8.9–10.3)
CO2: 25 mmol/L (ref 22–32)
Chloride: 107 mmol/L (ref 101–111)
Creatinine, Ser: 0.58 mg/dL (ref 0.44–1.00)
Glucose, Bld: 89 mg/dL (ref 65–99)
Potassium: 4.9 mmol/L (ref 3.5–5.1)
SODIUM: 137 mmol/L (ref 135–145)

## 2017-12-18 LAB — CULTURE, BLOOD (ROUTINE X 2)
Culture: NO GROWTH
SPECIAL REQUESTS: ADEQUATE

## 2017-12-18 LAB — CG4 I-STAT (LACTIC ACID): LACTIC ACID, VENOUS: 2.52 mmol/L — AB (ref 0.5–1.9)

## 2017-12-18 LAB — MAGNESIUM: Magnesium: 1.8 mg/dL (ref 1.7–2.4)

## 2017-12-18 MED ORDER — PROPRANOLOL HCL ER 60 MG PO CP24
60.0000 mg | ORAL_CAPSULE | Freq: Every day | ORAL | Status: DC
  Administered 2017-12-18: 60 mg via ORAL
  Filled 2017-12-18: qty 1

## 2017-12-18 MED ORDER — PROPRANOLOL HCL ER 60 MG PO CP24: 60.0000 mg | ORAL_CAPSULE | Freq: Every day | ORAL | 3 refills | Status: AC

## 2017-12-18 MED ORDER — VENLAFAXINE HCL ER 37.5 MG PO CP24: 37.5000 mg | ORAL_CAPSULE | Freq: Every day | ORAL | 0 refills | Status: AC

## 2017-12-18 MED ORDER — MAGNESIUM OXIDE 400 (241.3 MG) MG PO TABS: 400.0000 mg | ORAL_TABLET | Freq: Every day | ORAL | 1 refills | Status: AC

## 2017-12-18 MED ORDER — ADULT MULTIVITAMIN W/MINERALS CH: 1.0000 | ORAL_TABLET | Freq: Every day | ORAL | 1 refills | Status: AC

## 2017-12-18 MED ORDER — LAMOTRIGINE 25 MG PO TABS: 25.0000 mg | ORAL_TABLET | Freq: Every day | ORAL | 1 refills | Status: AC

## 2017-12-18 MED ORDER — FOLIC ACID 1 MG PO TABS: 1.0000 mg | ORAL_TABLET | Freq: Every day | ORAL | 3 refills | Status: AC

## 2017-12-18 MED ORDER — THIAMINE HCL 100 MG PO TABS: 100.0000 mg | ORAL_TABLET | Freq: Every day | ORAL | 3 refills | Status: AC

## 2017-12-18 MED ORDER — HYDROXYZINE HCL 25 MG PO TABS: 25.0000 mg | ORAL_TABLET | Freq: Every evening | ORAL | 0 refills | Status: AC | PRN

## 2017-12-18 MED ORDER — SUCRALFATE 1 GM/10ML PO SUSP: 1.0000 g | Freq: Three times a day (TID) | ORAL | 2 refills | Status: DC

## 2017-12-18 MED ORDER — LORAZEPAM 1 MG PO TABS: 1.0000 mg | ORAL_TABLET | Freq: Three times a day (TID) | ORAL | 0 refills | Status: AC | PRN

## 2017-12-18 MED ORDER — MAGNESIUM SULFATE IN D5W 1-5 GM/100ML-% IV SOLN
1.0000 g | Freq: Once | INTRAVENOUS | Status: AC
  Administered 2017-12-18: 1 g via INTRAVENOUS
  Filled 2017-12-18: qty 100

## 2017-12-18 MED ORDER — POTASSIUM CHLORIDE ER 20 MEQ PO TBCR: 20.0000 meq | EXTENDED_RELEASE_TABLET | Freq: Every day | ORAL | 1 refills | Status: DC

## 2017-12-18 NOTE — Care Management Important Message (Signed)
Important Message  Patient Details  Name: Tonna BoehringerJenny B Bensch MRN: [id number] Date of Birth: 1981-06-20   Medicare Important Message Given:  Yes    Paralee Pendergrass 12/18/2017, 2:02 PM

## 2017-12-18 NOTE — Discharge Instructions (Signed)
Get help to quit alcohol Get help to quit tobacco Take medications as prescribed Have your primary care doctor refer you to a GI/gastroenterologist due to persistent nausea and vomiting with diarrhea Have your primary care doctor arrange follow-up with cardiologist in 2-3 weeks Consider alcohol rehab program You have long QT syndrome-need to avoid medications that can prolong the QT including Levaquin and some psychiatric medications amongst many others

## 2017-12-18 NOTE — Progress Notes (Signed)
Progress Note  Patient Name: Gina Daniels Date of Encounter: 12/18/2017  Primary Cardiologist: New Dr. Anne FuSkains  Subjective   No chest pain. Intermittent dyspnea when HR goes above 120s  Inpatient Medications    Scheduled Meds: . feeding supplement (ENSURE ENLIVE)  237 mL Oral BID BM  . folic acid  1 mg Oral Daily  . hydrOXYzine  25 mg Oral QHS  . lamoTRIgine  25 mg Oral Daily  . LORazepam  1 mg Oral TID  . multivitamin with minerals  1 tablet Oral Daily  . nicotine  21 mg Transdermal Daily  . pantoprazole  40 mg Oral BID AC  . propranolol ER  60 mg Oral Daily  . sucralfate  1 g Oral TID WC & HS  . thiamine  100 mg Oral Daily  . venlafaxine  37.5 mg Oral BID WC   Continuous Infusions: . sodium chloride 75 mL/hr at 12/17/17 1852   PRN Meds: acetaminophen, loperamide, ondansetron (ZOFRAN) IV   Vital Signs    Vitals:   12/17/17 1649 12/17/17 2024 12/18/17 0108 12/18/17 0439  BP: (!) 167/113 (!) 136/100 136/78 (!) 144/94  Pulse:  (!) 108 (!) 105 (!) 110  Resp:  17 (!) 28 20  Temp:  98.3 F (36.8 C) 98.7 F (37.1 C) 98.4 F (36.9 C)  TempSrc:  Oral Oral Oral  SpO2:  100% 100% 96%  Weight:    118 lb 1.6 oz (53.6 kg)  Height:        Intake/Output Summary (Last 24 hours) at 12/18/2017 0817 Last data filed at 12/18/2017 0600 Gross per 24 hour  Intake 1062 ml  Output -  Net 1062 ml   Filed Weights   12/16/17 1708 12/17/17 0342 12/18/17 0439  Weight: 116 lb (52.6 kg) 118 lb 12.8 oz (53.9 kg) 118 lb 1.6 oz (53.6 kg)    Telemetry    Sinus tachycardia at rate of 110-120s- Personally Reviewed  ECG    SR, QT/QTc 356/470 ms - Personally Reviewed  Physical Exam   GEN: Thin frail female in no acute distress.   Neck: No JVD Cardiac: RRR, no murmurs, rubs, or gallops.  Respiratory: Clear to auscultation bilaterally. GI: Soft, nontender, non-distended  MS: No edema; No deformity. Neuro:  Nonfocal  Psych: Normal affect   Labs    Chemistry Recent Labs   Lab 12/13/17 1004  12/16/17 0750 12/17/17 0358 12/18/17 0401  NA 130*   < > 138 137 137  K 2.5*   < > 3.5 3.4* 4.9  CL 78*   < > 107 108 107  CO2 26   < > 24 22 25   GLUCOSE 136*   < > 97 107* 89  BUN 8   < > <5* <5* <5*  CREATININE 1.32*   < > 0.53 0.61 0.58  CALCIUM 8.7*   < > 8.1* 8.1* 8.4*  PROT 7.0  --  5.3*  --   --   ALBUMIN 3.7  --  2.9*  --   --   AST 61*  --  30  --   --   ALT 28  --  27  --   --   ALKPHOS 61  --  47  --   --   BILITOT 2.6*  --  0.9  --   --   GFRNONAA 51*   < > >60 >60 >60  GFRAA 59*   < > >60 >60 >60  ANIONGAP 26*   < >  7 7 5    < > = values in this interval not displayed.     Hematology Recent Labs  Lab 12/13/17 1004 12/15/17 0253 12/17/17 0358  WBC 20.4* 9.0 8.9  RBC 4.81 3.09* 3.32*  HGB 18.3* 11.4* 12.3  HCT 48.4* 32.1* 35.4*  MCV 100.6* 103.9* 106.6*  MCH 38.0* 36.9* 37.0*  MCHC 37.8* 35.5 34.7  RDW 14.2 14.1 14.4  PLT 191 114* 157    Recent Labs  Lab 12/13/17 1259  TROPIPOC 0.00      Radiology    No results found.  Cardiac Studies   Echo 12/15/17 Study Conclusions  - Left ventricle: The cavity size was normal. Wall thickness was   normal. Systolic function was normal. The estimated ejection   fraction was in the range of 55% to 60%. Wall motion was normal;   there were no regional wall motion abnormalities. Left   ventricular diastolic function parameters were normal. - Mitral valve: There was mild regurgitation.  Patient Profile     36 y.o. female with a significant phychiatric history, including PTSD, Anxiety Depression, Anorexia Nervosa, as well as ETOH/ polysubstance abuse presentedto theED w/nausea and vomiting withnonbloody but+ biliousemesis. Cardiology consulted for Prolonged QT w/ Subsequent VT and Cardiac Arrest:>>ROSC after CPR and defibrillation insetting of severe electrolyte abnormality.   Assessment & Plan    1. Ventricular tachycardia/fibrillation - Seen by Dr. Ladona Ridgelaylor yesterday and felt  her arrhythmias are due to severe electrolyte abnormality and quinolone antibiotics. No reoccurrence. Start Inderal ER today and observe HR closely.   2. QT prolongation - Improved. Avoid QT prolonged drug.    3. Persistent nausea, vomiting and diarrhea - No occurrence since yesterday. Per primary  4. Tobacco abuse - Advised cessation.   For questions or updates, please contact [facility] Please consult [email or web address] for contact info under Cardiology/STEMI.      Lorelei PontSigned, Bhavinkumar Bhagat, PA  12/18/2017, 8:17 AM    Personally seen and examined. Agree with above.  36 year old female with history of chronic vomiting, nausea, previously diagnosed with anorexia nervosa, severe depression, who presented with severe hypokalemia and hypomagnesemia which resulted in prolonged QT and ventricular tachycardia in the emergency department requiring defibrillation.  Dr. Sharrell [name] with elective physiology saw her on 12/17/17-no defibrillator.  Inderal, beta-blocker utilized.  Likely does have an underlying prolonged QT.  Continue to keep magnesium greater than 2 and potassium greater than 4.   Exam: This morning upright walking to bathroom, tachycardic regular lungs are clear no edema  Telemetry personally reviewed shows sinus rhythm/sinus tachycardia currently approximately 110 bpm.  EKG most recently on 12/18/17 at 4:31 AM shows QTC of 470 ms.  Improved.  T wave changes have improved as well.  -Continue with Inderal avoid QT prolonging agents quinolones  We will sign off.  Please let us know if we can be of further assistance.  Donato SchultzMark Demarri Elie, MD

## 2017-12-18 NOTE — Discharge Summary (Signed)
Gina Daniels, is a 36 y.o. female  DOB [birth year]  MRN [id number].  Admission date:  12/13/2017  Admitting Physician  Albertine Grates, MD  Discharge Date:  12/18/2017   Primary MD  Truman Hayward, FNP  Recommendations for primary care physician for things to follow:   PCP to refer patient to GI due to persistent vomiting and diarrhea with electrolyte abnormalities putting her at further risk for ventricular arrhythmias  Follow-up with cardiologist in 2-3 weeks  Admission Diagnosis  Hypokalemia [E87.6] Alcohol abuse [F10.10] Ventricular tachycardia (HCC) [I47.2] High anion gap metabolic acidosis [E87.2] Nausea vomiting and diarrhea [R11.2, R19.7] Aspiration pneumonia, unspecified aspiration pneumonia type, unspecified laterality, unspecified part of lung (HCC) [J69.0]   Discharge Diagnosis  Hypokalemia [E87.6] Alcohol abuse [F10.10] Ventricular tachycardia (HCC) [I47.2] High anion gap metabolic acidosis [E87.2] Nausea vomiting and diarrhea [R11.2, R19.7] Aspiration pneumonia, unspecified aspiration pneumonia type, unspecified laterality, unspecified part of lung (HCC) [J69.0]    Principal Problem:   Cardiac arrest with ventricular fibrillation/Prolonged QT Active Problems:   Alcohol abuse   MDD (major depressive disorder), recurrent episode, severe (HCC)   Long Q-T syndrome   Hypokalemia/Hypomagnesemia   Malnutrition of moderate degree      Past Medical History:  Diagnosis Date  . Anorexia   . Anorexia nervosa   . Anxiety   . Depression   . 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 Gallstones   . Headache   . Hypertension   . PTSD (post-traumatic stress disorder)     Past Surgical History:  Procedure Laterality Date  . CHALAZION EXCISION     2012 LEFT EYE  . FOOT SURGERY     RIGHT   . INCISION AND DRAINAGE Right 10/05/2014   Procedure: INCISION AND DRAINAGE  RIGHT FOOT WOUND WITH APPLICATION OF WOUND VAC;  Surgeon: Toni Arthurs, MD;  Location: [facility] OR;  Service: Orthopedics;  Laterality: Right;  . ORIF ULNAR FRACTURE Left 08/22/2016   Procedure: OPEN REDUCTION INTERNAL FIXATION (ORIF)  LEFT ULNA FRACTURE;  Surgeon: Tarry Kos, MD;  Location: [facility] OR;  Service: Orthopedics;  Laterality: Left;  . TUBAL LIGATION  2002  . tubal reversal     02/2014  . 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   In the ED she was found to have [facility] 20,potassium 2.5,magnesium 1,creatinine 1.3,total bili 2.6,AST61. CT Abd/pelvis without acuteabdominalabnormalitybut w/patchy opacities in the lungs. She was Guaiac positive,andstarted on Protonix bolus and dripin the ED.EKG in the EDnotedprolonged QTc 620. While she is in the ED,she hadcardiac arrest due toVtach/Afib,she received cpr ands/p defib with RSOC. She is doing better,EDP discussed with  critical care who declined admission.   States consumes 7 shots of liquor per day had done this up till 12/10/17  when she discontinued abruptly.  States has had vomiting for 3 months. Smokes a bowl of marijuana per day secondary to not being able to obtain psychiatric medication. States has not seen a psychiatrist in 2 years secondary to insurance problems.       Hospital Course:     1)V. Fib/Vtach Arrest in ED -on admission QTc was 620, magnesium was 1.0 with a potassium of 2.6 -CPR and Defib>>> ROSC -Echocardiogram with preserved EF of 55-60%, no significant wall motion abnormalities     -Avoid all QT prolonging medication, EKG on 12/18/2017 with a QTc of 470. Cardiology consult from Dr. Anne FuSkains and Dr. Sharrell [name] appreciated, ???  If patient has underlying long QT syndrome at baseline, exacerbated by electrolyte abnormalities in the setting of alcohol abuse, GI losses and nutritional marasms.  Potassium and magnesium have been replaced Discharge on propanolol XL 60 mg daily as ordered by cardiologist  2)Sinus tachycardia/HTN-stable p.o. propanolol for rate control and BP control, and for underlying long QT syndrome  3)Prolonged QT interval- EKG on 12/18/2017 with a QTc of  470,  , cardiology consult appreciated,  echocardiogram as noted above, please see #1 above  4)FEN/Hypokalemia/hypomagnesemia and hyponatremia-continue Replaced, d/c home on potassium supplements and mag supplements , electrolyte abnormalities most likely related to alcohol abuse and nutritional marasms in the setting of anorexia, vomiting or diarrhea, please see #1 above  5)Aspiration Pneumonia vs Pneumonitis - -Negative fever, negative leukocytosis, negative bands, negative left shift hold on antibiotics , from a respiratory standpoint patient is doing well.  Repeat blood cultures from 12/16/2017 still negative  6)Coag Negative Staph in 1 of 1 blood cx- Unfortunately it appears only 1 blood cx was obtained on 12/13/17,  - this is suspicious for contamination, but pt is also at risk of potential bacteremia , currently off antibiotics, Repeat blood cultures from 12/16/2017 still negative  7)Transaminitis - Fatty liver - Secondary to EtOH abuse, acute viral hepatitis panel is negative  8)EtOH Abuse/Polysubstance Abuse- -12/22 UDS positive for marijuana, no evidence of frank DTs at this time last alcoholic intake was 10/10/2017, c/n lorazepam prn ,  continue thiamine and folic acid  9)Tobacco abuse-tobacco cessation advised, nicotine patches prescribed  10)Severe protein calorie malnutrition- BMI is  around 20, history of anorexia  11)PTSD, Anxiety/Depression, Anorexia Nervosa- psychiatric  consult appreciated, advised to c/n Effexor 37.5 mg twice daily, Lamictal 25 mg daily and Vistaril 25 mg 3 times daily as needed  Discharge Condition: stAble  Follow UP-GI and cardiology    Consults obtained -psychiatry, cardiology and electrophysiology  Diet and Activity recommendation:  As advised  Discharge Instructions    Discharge Instructions    Call MD for:   Complete by:  As directed    Call MD for:  difficulty breathing, headache or visual disturbances   Complete by:  As directed    Call MD for:  persistant dizziness or light-headedness   Complete by:  As directed    Call MD for:  persistant nausea and vomiting   Complete by:  As directed    Call MD for:  redness, tenderness, or signs of infection (pain, swelling, redness, odor or green/yellow discharge around incision site)   Complete by:  As directed    Call MD for:  temperature >100.4   Complete by:  As directed    Diet general   Complete by:  As directed    Discharge instructions   Complete by:  As directed    Get help to quit alcohol Get help to quit tobacco Take medications as prescribed Have your primary care doctor refer you to a GI/gastroenterologist due to persistent nausea and vomiting with diarrhea Have your primary care doctor arrange follow-up with cardiologist in 2-3 weeks Consider alcohol rehab program You have long QT syndrome-need to avoid medications that can prolong the QT including Levaquin and some psychiatric medications amongst many others   Increase activity slowly   Complete by:  As directed         Discharge Medications     Allergies as of 12/18/2017      Reactions   Latex Anaphylaxis   Penicillins Anaphylaxis   Has patient had a PCN reaction causing immediate rash, facial/tongue/throat swelling, SOB or lightheadedness with hypotension:  yes Has patient had a PCN reaction causing severe  rash involving mucus membranes or skin necrosis: no Has patient had a PCN reaction that required hospitalization: yes Has patient had a PCN reaction occurring within the last 10 years: yes If all of the above answers are "NO", then may proceed with Cephalosporin use.   Codeine Hives, Rash   Sulfa Antibiotics Hives   Morphine And Related Rash   Tape Rash      Medication List    TAKE these medications   folic acid 1 MG tablet Commonly known as:  FOLVITE Take 1 tablet (1 mg total) by mouth daily. Start taking on:  12/19/2017   hydrOXYzine 25 MG tablet Commonly known as:  ATARAX/VISTARIL Take 1 tablet (25 mg total) by mouth at bedtime as needed for anxiety or itching. What changed:    medication strength  how much to take  reasons to take this   lamoTRIgine 25 MG tablet Commonly known as:  LAMICTAL Take 1 tablet (25 mg total) by mouth daily. Start taking on:  12/19/2017 What changed:    medication strength  how much to take  when to take this   LORazepam 1 MG tablet Commonly known as:  ATIVAN Take 1 tablet (1 mg total) by mouth every 8 (eight) hours as needed for anxiety.   magnesium oxide 400 (241.3 Mg) MG tablet Commonly known as:  MAG-OX Take 1 tablet (400 mg total) by mouth daily.   multivitamin with minerals Tabs tablet Take 1 tablet by mouth daily. Start taking on:  12/19/2017   naltrexone 50 MG tablet Commonly known as:  DEPADE Take 0.5 tablets (25 mg total) by mouth daily.   nicotine 21 mg/24hr patch Commonly known as:  NICODERM CQ - dosed in mg/24 hours Place 1 patch (21 mg total) onto the skin daily.   ondansetron 4 MG disintegrating tablet Commonly known as:  ZOFRAN ODT Take 1 tablet (4 mg total) by mouth every 8 (eight) hours as needed for nausea or vomiting.   Potassium Chloride ER 20 MEQ Tbcr Take 20 mEq by mouth daily. 1 tab daily by mouth   prazosin 2 MG capsule Commonly known as:  MINIPRESS Take 2 capsules (4 mg total) by mouth at  bedtime.   propranolol ER 60 MG 24 hr capsule Commonly known as:  INDERAL LA Take 1 capsule (60 mg total) by mouth daily. Start taking on:  12/19/2017   sucralfate 1 GM/10ML suspension Commonly known as:  CARAFATE Take 10 mLs (1 g total) by mouth 4 (four) times daily -  with meals and at bedtime.   thiamine 100  MG tablet Take 1 tablet (100 mg total) by mouth daily.   traZODone 50 MG tablet Commonly known as:  DESYREL Take 1 tablet (50 mg total) by mouth at bedtime as needed for sleep.   venlafaxine XR 37.5 MG 24 hr capsule Commonly known as:  EFFEXOR-XR Take 1 capsule (37.5 mg total) by mouth daily with breakfast. With one 150mg  capsule What changed:  Another medication with the same name was removed. Continue taking this medication, and follow the directions you see here.       Major procedures and Radiology Reports - PLEASE review detailed and final reports for all details, in brief -   Ct Head Wo Contrast  Result Date: 12/13/2017 CLINICAL DATA:  Seizure.  Alcohol abuse disorder. EXAM: CT HEAD WITHOUT CONTRAST TECHNIQUE: Contiguous axial images were obtained from the base of the skull through the vertex without intravenous contrast. COMPARISON:  None. FINDINGS: Brain: Premature for age cerebral and cerebellar atrophy. No evidence for acute infarction, hemorrhage, mass lesion, hydrocephalus, or extra-axial fluid. No definite white matter disease. Vascular:  Negative. Skull: Negative. Sinuses/Orbits: Negative. Other: Negative. IMPRESSION: Premature for age atrophy.  No acute intracranial findings. Electronically Signed   By: Elsie Stain M.D.   On: 12/13/2017 13:50   Ct Abdomen Pelvis W Contrast  Result Date: 12/13/2017 CLINICAL DATA:  Nausea and vomiting. Anorexia nervosa. Smoker. Alcohol abuse. EXAM: CT ABDOMEN AND PELVIS WITH CONTRAST TECHNIQUE: Multidetector CT imaging of the abdomen and pelvis was performed using the standard protocol following bolus administration of  intravenous contrast. CONTRAST:  ISOVUE-300 IOPAMIDOL (ISOVUE-300) INJECTION 61% COMPARISON:  08/19/2017 FINDINGS: Lower chest: Interval multiple focal areas of patchy opacity in the left lower lobe. Hepatobiliary: Diffuse low density of the liver relative to the spleen without significant change. Unremarkable gallbladder. Pancreas: Unremarkable. No pancreatic ductal dilatation or surrounding inflammatory changes. Spleen: Normal in size without focal abnormality. Adrenals/Urinary Tract: Unremarkable adrenal glands. Stable left renal cyst. Unremarkable right kidney, ureters and urinary bladder. Stomach/Bowel: Stomach is within normal limits. Appendix appears normal. No evidence of bowel wall thickening, distention, or inflammatory changes. Vascular/Lymphatic: No significant vascular findings are present. No enlarged abdominal or pelvic lymph nodes. Reproductive: Uterus and bilateral adnexa are unremarkable. Tampon in the vagina. Other: Small umbilical hernia containing fat. Musculoskeletal: Mild degenerative changes at the L5-S1 level. IMPRESSION: 1. Interval multiple focal patchy opacities in the left lower lobe, suspicious for pneumonia or aspiration pneumonitis. 2. Stable diffuse hepatic steatosis. Electronically Signed   By: Beckie Salts M.D.   On: 12/13/2017 13:53   Dg Chest Port 1 View  Result Date: 12/13/2017 CLINICAL DATA:  Three-day history of generalized chest pain, nausea and vomiting, and diarrhea. Metabolic acidosis. EXAM: PORTABLE CHEST 1 VIEW COMPARISON:  10/04/2014, 08/15/2009 and earlier. FINDINGS: External pacing pads are present. Cardiomediastinal silhouette unremarkable for AP portable technique, unchanged. Lungs clear. Bronchovascular markings normal. Pulmonary vascularity normal. No visible pleural effusions. No pneumothorax. IMPRESSION: No acute cardiopulmonary disease. Electronically Signed   By: Hulan Saas M.D.   On: 12/13/2017 14:07    Micro Results   Recent Results  (from the past 240 hour(s))  Blood culture (routine x 2)     Status: None (Preliminary result)   Collection Time: 12/13/17 12:20 PM  Result Value Ref Range Status   Specimen Description BLOOD LEFT ANTECUBITAL  Final   Special Requests   Final    BOTTLES DRAWN AEROBIC AND ANAEROBIC Blood Culture adequate volume   Culture NO GROWTH 4 DAYS  Final  Report Status PENDING  Incomplete  Blood culture (routine x 2)     Status: Abnormal   Collection Time: 12/13/17 12:47 PM  Result Value Ref Range Status   Specimen Description BLOOD LEFT WRIST  Final   Special Requests   Final    BOTTLES DRAWN AEROBIC AND ANAEROBIC Blood Culture adequate volume   Culture  Setup Time   Final    GRAM POSITIVE COCCI IN BOTH AEROBIC AND ANAEROBIC BOTTLES CRITICAL RESULT CALLED TO, READ BACK BY AND VERIFIED WITH: J [facility] PHARMD [id number] 12/14/17 A BROWNING    Culture (A)  Final    STAPHYLOCOCCUS SPECIES (COAGULASE NEGATIVE) THE SIGNIFICANCE OF ISOLATING THIS ORGANISM FROM A SINGLE SET OF BLOOD CULTURES WHEN MULTIPLE SETS ARE DRAWN IS UNCERTAIN. PLEASE NOTIFY THE MICROBIOLOGY DEPARTMENT WITHIN ONE WEEK IF SPECIATION AND SENSITIVITIES ARE REQUIRED.    Report Status 12/16/2017 FINAL  Final  Blood Culture ID Panel (Reflexed)     Status: Abnormal   Collection Time: 12/13/17 12:47 PM  Result Value Ref Range Status   Enterococcus species NOT DETECTED NOT DETECTED Final   Listeria monocytogenes NOT DETECTED NOT DETECTED Final   Staphylococcus species DETECTED (A) NOT DETECTED Final    Comment: Methicillin (oxacillin) resistant coagulase negative staphylococcus. Possible blood culture contaminant (unless isolated from more than one blood culture draw or clinical case suggests pathogenicity). No antibiotic treatment is indicated for blood  culture contaminants. CRITICAL RESULT CALLED TO, READ BACK BY AND VERIFIED WITH: J [facility] PHARMD [id number] 12/14/17 A BROWNING    Staphylococcus aureus NOT DETECTED NOT DETECTED Final    Methicillin resistance DETECTED (A) NOT DETECTED Final    Comment: CRITICAL RESULT CALLED TO, READ BACK BY AND VERIFIED WITH: Melven Sartorius PHARMD [id number] 12/14/17 A BROWNING    Streptococcus species NOT DETECTED NOT DETECTED Final   Streptococcus agalactiae NOT DETECTED NOT DETECTED Final   Streptococcus pneumoniae NOT DETECTED NOT DETECTED Final   Streptococcus pyogenes NOT DETECTED NOT DETECTED Final   Acinetobacter baumannii NOT DETECTED NOT DETECTED Final   Enterobacteriaceae species NOT DETECTED NOT DETECTED Final   Enterobacter cloacae complex NOT DETECTED NOT DETECTED Final   Escherichia coli NOT DETECTED NOT DETECTED Final   Klebsiella oxytoca NOT DETECTED NOT DETECTED Final   Klebsiella pneumoniae NOT DETECTED NOT DETECTED Final   Proteus species NOT DETECTED NOT DETECTED Final   Serratia marcescens NOT DETECTED NOT DETECTED Final   Haemophilus influenzae NOT DETECTED NOT DETECTED Final   Neisseria meningitidis NOT DETECTED NOT DETECTED Final   Pseudomonas aeruginosa NOT DETECTED NOT DETECTED Final   Candida albicans NOT DETECTED NOT DETECTED Final   Candida glabrata NOT DETECTED NOT DETECTED Final   Candida krusei NOT DETECTED NOT DETECTED Final   Candida parapsilosis NOT DETECTED NOT DETECTED Final   Candida tropicalis NOT DETECTED NOT DETECTED Final  MRSA PCR Screening     Status: None   Collection Time: 12/13/17  4:44 PM  Result Value Ref Range Status   MRSA by PCR NEGATIVE NEGATIVE Final    Comment:        The GeneXpert MRSA Assay (FDA approved for NASAL specimens only), is one component of a comprehensive MRSA colonization surveillance program. It is not intended to diagnose MRSA infection nor to guide or monitor treatment for MRSA infections.   Culture, blood (Routine X 2) w Reflex to ID Panel     Status: None (Preliminary result)   Collection Time: 12/16/17  3:59 PM  Result Value Ref Range Status   Specimen  Description BLOOD LEFT HAND  Final   Special Requests IN  PEDIATRIC BOTTLE Blood Culture adequate volume  Final   Culture NO GROWTH < 24 HOURS  Final   Report Status PENDING  Incomplete  Culture, blood (Routine X 2) w Reflex to ID Panel     Status: None (Preliminary result)   Collection Time: 12/16/17  3:59 PM  Result Value Ref Range Status   Specimen Description BLOOD RIGHT HAND  Final   Special Requests IN PEDIATRIC BOTTLE Blood Culture adequate volume  Final   Culture NO GROWTH < 24 HOURS  Final   Report Status PENDING  Incomplete       Today   Subjective    Gina Daniels today has no new complaints, significant other at bedside, RN at bedside, patient ambulating without difficulty on exertion or dizziness or palpitations or chest pain.  Vomiting no diarrhea.  Denies drinking well          Patient has been seen and examined prior to discharge   Objective   Blood pressure (!) 146/90, pulse (!) 104, temperature 97.6 F (36.4 C), temperature source Oral, resp. rate (!) 33, height 5\' 5"  (1.651 m), weight 53.6 kg (118 lb 1.6 oz), last menstrual period 12/10/2017, SpO2 100 %.   Intake/Output Summary (Last 24 hours) at 12/18/2017 1450 Last data filed at 12/18/2017 1215 Gross per 24 hour  Intake 1782 ml  Output -  Net 1782 ml    Exam Gen:- Awake  In no apparent distress  HEENT:- [place].AT,   Neck-Supple Neck,No JVD,  Lungs- mostly clear CV- S1, S2 normal Abd-  +ve B.Sounds, Abd Soft, No tenderness,    Extremity/Skin:- Intact peripheral pulses   Psych-appropriate affect Neuro-no tremors, no new focal deficits   Data Review   CBC w Diff:  Lab Results  Component Value Date   WBC 8.9 12/17/2017   HGB 12.3 12/17/2017   HCT 35.4 (L) 12/17/2017   PLT 157 12/17/2017   LYMPHOPCT 36 04/30/2015   MONOPCT 7 04/30/2015   EOSPCT 4 04/30/2015   BASOPCT 0 04/30/2015    CMP:  Lab Results  Component Value Date   NA 137 12/18/2017   K 4.9 12/18/2017   CL 107 12/18/2017   CO2 25 12/18/2017   BUN <5 (L) 12/18/2017   CREATININE  0.58 12/18/2017   PROT 5.3 (L) 12/16/2017   ALBUMIN 2.9 (L) 12/16/2017   BILITOT 0.9 12/16/2017   ALKPHOS 47 12/16/2017   AST 30 12/16/2017   ALT 27 12/16/2017  .  Total Discharge time is about 33 minutes  Shon Haleourage Dare Spillman M.D on 12/18/2017 at 2:50 PM  [facility]   Office  [phone]  Voice Recognition Reubin Milan/Dragon dictation system was used to create this note, attempts have been made to correct errors. Please contact the author with questions and/or clarifications.

## 2017-12-21 LAB — CULTURE, BLOOD (ROUTINE X 2)
Culture: NO GROWTH
Culture: NO GROWTH
SPECIAL REQUESTS: ADEQUATE
Special Requests: ADEQUATE

## 2018-06-04 IMAGING — CT CT HEAD W/O CM
4 series · 17 of 47 positions shown, 19 images · non-contrast
Comparison: None.

CLINICAL DATA: Seizure.  Alcohol abuse disorder.

EXAM:
CT HEAD WITHOUT CONTRAST
TECHNIQUE: Contiguous axial images were obtained from the base of the skull
through the vertex without intravenous contrast.

[Series 3: head wo · axial · 0.40mm/px · z∈[-126,-6]mm · 7 of 33 slices shown, 9 images]
[im 5/33  brain]
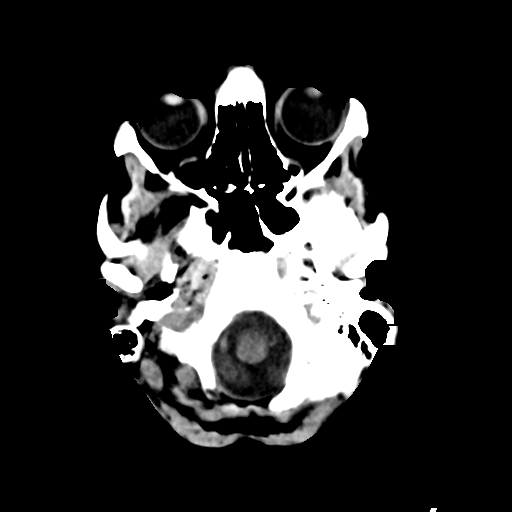
[im 5/33  bone]
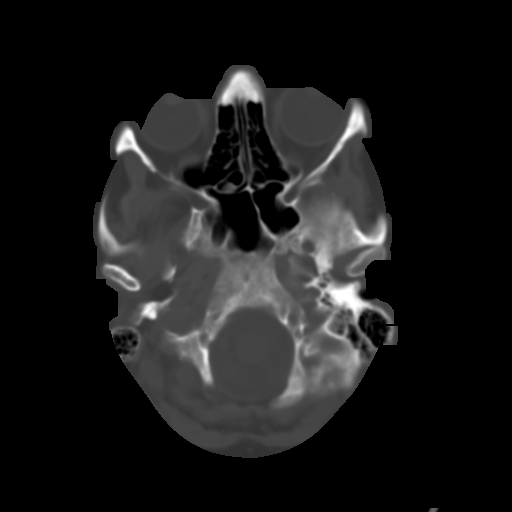
[im 9/33  brain]
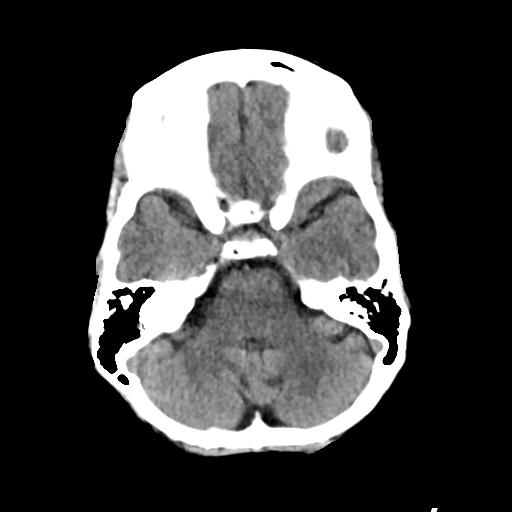
[im 13/33  brain]
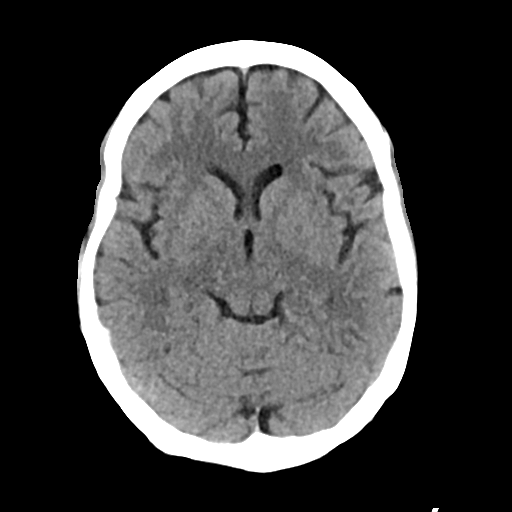
[im 17/33  brain]
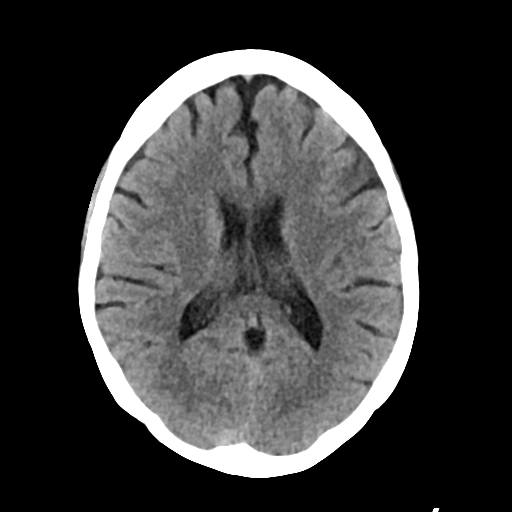
[im 21/33  brain]
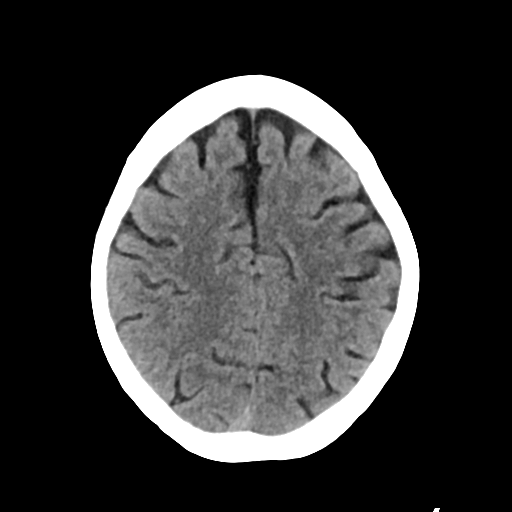
[im 21/33  bone]
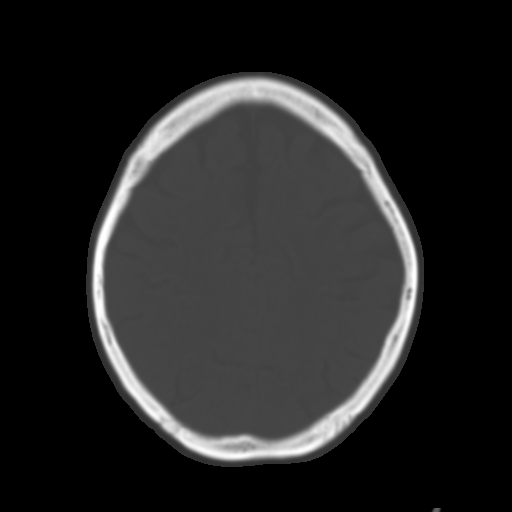
[im 25/33  brain]
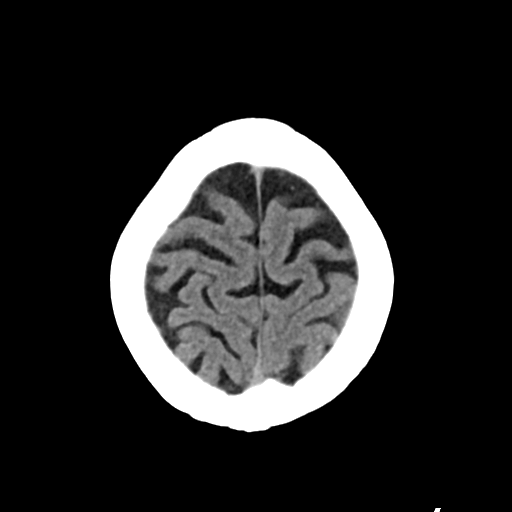
[im 29/33  brain]
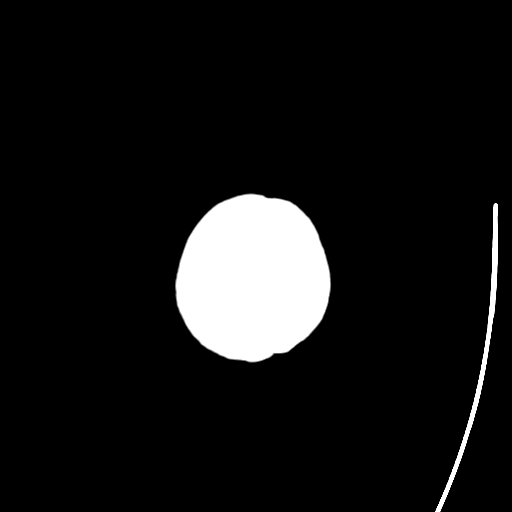

[Series 4: head bone · axial · 0.40mm/px · z∈[-130,-74]mm · 4 of 83 slices shown]
[im 9/83  bone]
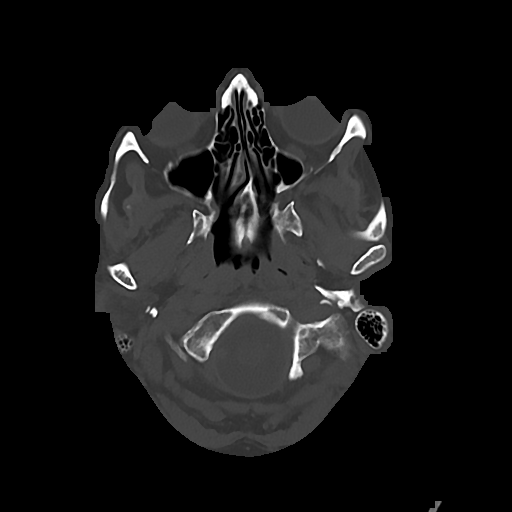
[im 17/83  bone]
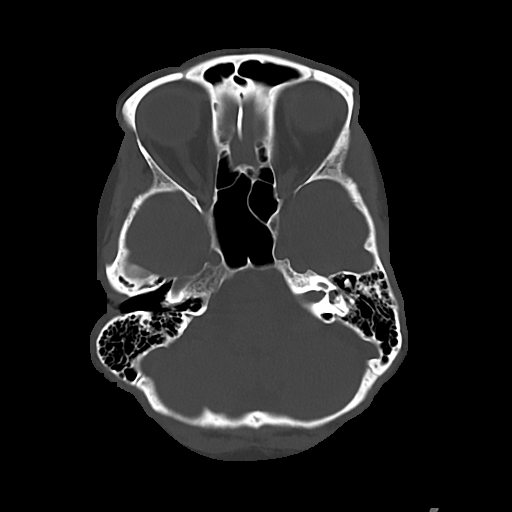
[im 25/83  bone]
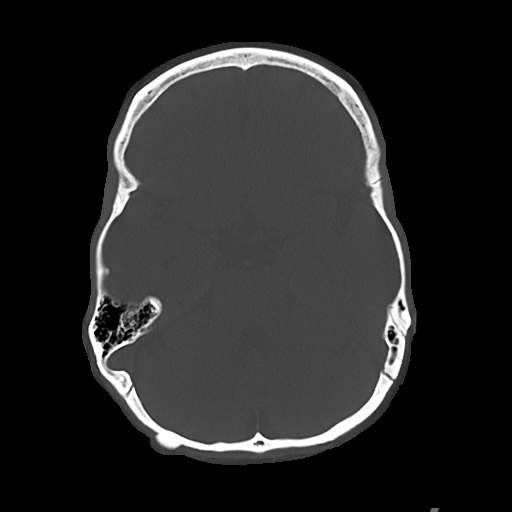
[im 37/83  bone]
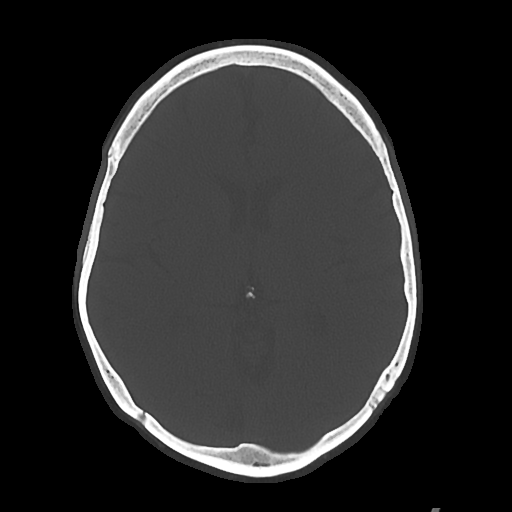

[Series 5: cor soft · coronal · 0.35mm/px · 3 of 67 slices shown]
[im 23/67  brain]
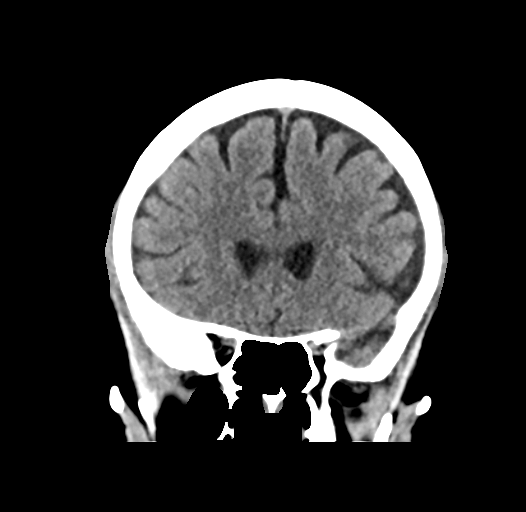
[im 30/67  brain]
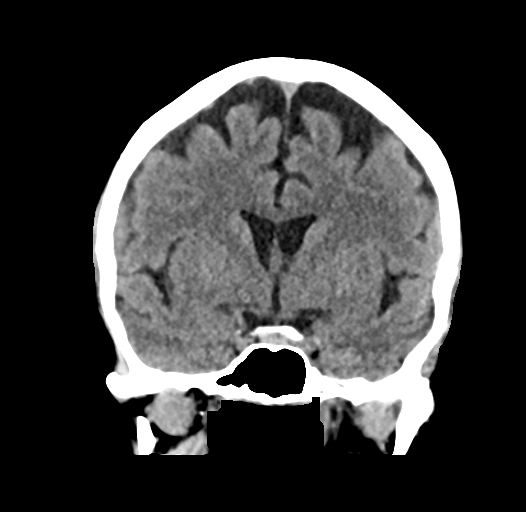
[im 37/67  brain]
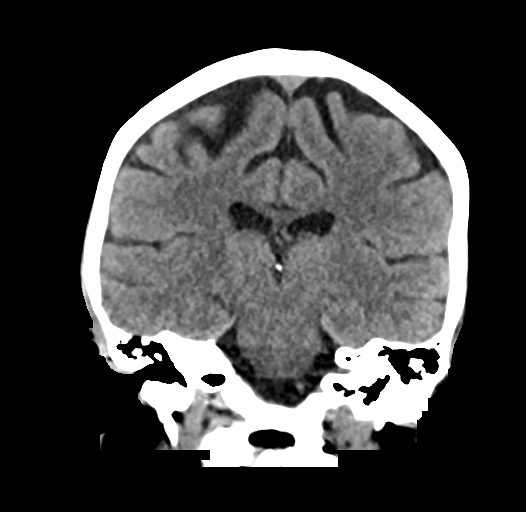

[Series 6: sag soft · sagittal · 0.37mm/px · 3 of 57 slices shown]
[im 19/57  brain]
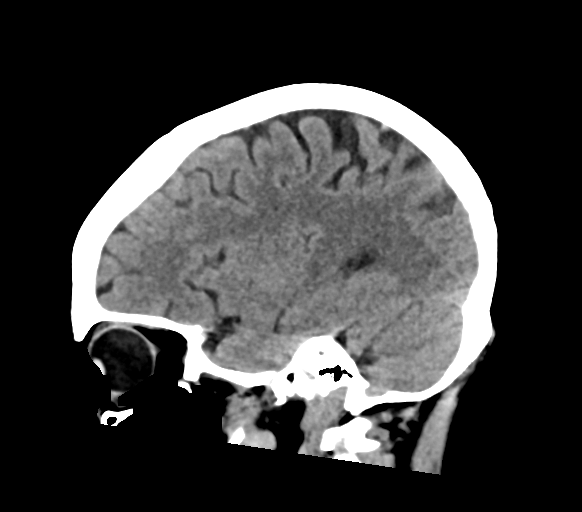
[im 29/57  brain]
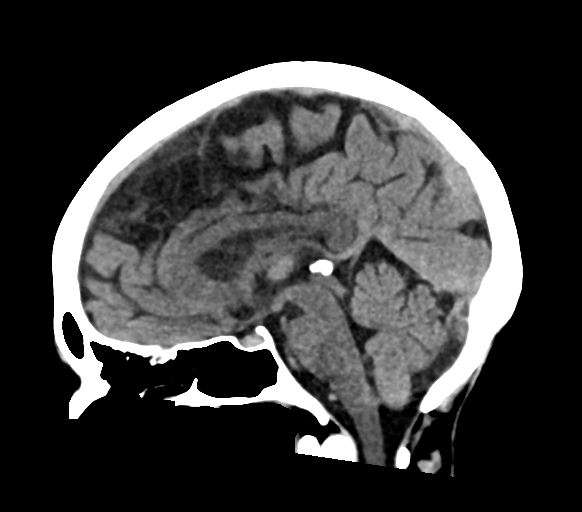
[im 38/57  brain]
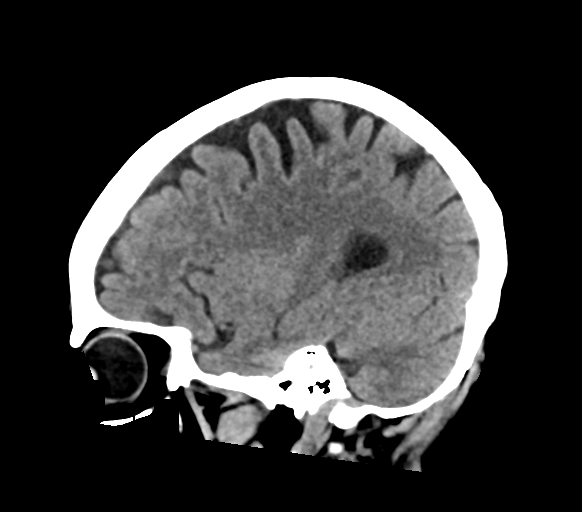

[17 of 47 positions shown; findings below may reference images not displayed]

FINDINGS: Brain: Premature for age cerebral and cerebellar atrophy. No
evidence for acute infarction, hemorrhage, mass lesion,
hydrocephalus, or extra-axial fluid. No definite white matter
disease.

Vascular:  Negative.

Skull: Negative.

Sinuses/Orbits: Negative.

Other: Negative.
IMPRESSION: Premature for age atrophy.  No acute intracranial findings.

## 2018-08-12 ENCOUNTER — Emergency Department (HOSPITAL_COMMUNITY): Payer: Medicare Other

## 2018-08-12 ENCOUNTER — Other Ambulatory Visit: Payer: Self-pay

## 2018-08-12 ENCOUNTER — Encounter (HOSPITAL_COMMUNITY): Payer: Self-pay | Admitting: Emergency Medicine

## 2018-08-12 ENCOUNTER — Observation Stay (HOSPITAL_COMMUNITY)
Admission: EM | Admit: 2018-08-12 | Discharge: 2018-08-13 | Disposition: A | Discharge status: Home / Self Care | Payer: Medicare Other | Attending: Internal Medicine | Admitting: Internal Medicine

## 2018-08-12 DIAGNOSIS — I4581 Long QT syndrome: Secondary | ICD-10-CM | POA: Diagnosis not present

## 2018-08-12 DIAGNOSIS — F1010 Alcohol abuse, uncomplicated: Secondary | ICD-10-CM | POA: Diagnosis not present

## 2018-08-12 DIAGNOSIS — F17210 Nicotine dependence, cigarettes, uncomplicated: Secondary | ICD-10-CM | POA: Insufficient documentation

## 2018-08-12 DIAGNOSIS — E440 Moderate protein-calorie malnutrition: Secondary | ICD-10-CM | POA: Diagnosis not present

## 2018-08-12 DIAGNOSIS — F488 Other specified nonpsychotic mental disorders: Secondary | ICD-10-CM | POA: Diagnosis not present

## 2018-08-12 DIAGNOSIS — I4901 Ventricular fibrillation: Secondary | ICD-10-CM | POA: Insufficient documentation

## 2018-08-12 DIAGNOSIS — R079 Chest pain, unspecified: Secondary | ICD-10-CM | POA: Insufficient documentation

## 2018-08-12 DIAGNOSIS — Z91040 Latex allergy status: Secondary | ICD-10-CM | POA: Diagnosis not present

## 2018-08-12 DIAGNOSIS — F332 Major depressive disorder, recurrent severe without psychotic features: Secondary | ICD-10-CM | POA: Diagnosis not present

## 2018-08-12 DIAGNOSIS — Z79899 Other long term (current) drug therapy: Secondary | ICD-10-CM | POA: Insufficient documentation

## 2018-08-12 DIAGNOSIS — I469 Cardiac arrest, cause unspecified: Secondary | ICD-10-CM | POA: Diagnosis present

## 2018-08-12 DIAGNOSIS — R55 Syncope and collapse: Principal | ICD-10-CM | POA: Insufficient documentation

## 2018-08-12 DIAGNOSIS — I10 Essential (primary) hypertension: Secondary | ICD-10-CM | POA: Insufficient documentation

## 2018-08-12 LAB — CBC
HEMATOCRIT: 50.6 % — AB (ref 36.0–46.0)
HEMOGLOBIN: 17 g/dL — AB (ref 12.0–15.0)
MCH: 34.8 pg — AB (ref 26.0–34.0)
MCHC: 33.6 g/dL (ref 30.0–36.0)
MCV: 103.7 fL — AB (ref 78.0–100.0)
Platelets: 196 10*3/uL (ref 150–400)
RBC: 4.88 MIL/uL (ref 3.87–5.11)
RDW: 13 % (ref 11.5–15.5)
WBC: 9.1 10*3/uL (ref 4.0–10.5)

## 2018-08-12 LAB — BASIC METABOLIC PANEL
ANION GAP: 17 — AB (ref 5–15)
BUN: <5 mg/dL — ABNORMAL LOW (ref 6–20)
CHLORIDE: 106 mmol/L (ref 98–111)
CO2: 22 mmol/L (ref 22–32)
Calcium: 9 mg/dL (ref 8.9–10.3)
Creatinine, Ser: 0.64 mg/dL (ref 0.44–1.00)
GFR calc non Af Amer: >60 mL/min (ref >60)
Glucose, Bld: 95 mg/dL (ref 70–99)
Potassium: 4.5 mmol/L (ref 3.5–5.1)
Sodium: 145 mmol/L (ref 135–145)

## 2018-08-12 LAB — TROPONIN I
Troponin I: <0.03 ng/mL (ref <0.03)
Troponin I: <0.03 ng/mL (ref <0.03)

## 2018-08-12 LAB — ETHANOL: Alcohol, Ethyl (B): 202 mg/dL — ABNORMAL HIGH (ref <10)

## 2018-08-12 LAB — RAPID URINE DRUG SCREEN, HOSP PERFORMED
AMPHETAMINES: NOT DETECTED
BENZODIAZEPINES: NOT DETECTED
Barbiturates: NOT DETECTED
Cocaine: NOT DETECTED
OPIATES: NOT DETECTED
Tetrahydrocannabinol: NOT DETECTED

## 2018-08-12 LAB — I-STAT BETA HCG BLOOD, ED (MC, WL, AP ONLY): I-stat hCG, quantitative: <5 m[IU]/mL (ref <5)

## 2018-08-12 LAB — MAGNESIUM: Magnesium: 2 mg/dL (ref 1.7–2.4)

## 2018-08-12 LAB — I-STAT TROPONIN, ED: Troponin i, poc: 0 ng/mL (ref 0.00–0.08)

## 2018-08-12 LAB — CBG MONITORING, ED: GLUCOSE-CAPILLARY: 78 mg/dL (ref 70–99)

## 2018-08-12 MED ORDER — FLUCONAZOLE 100MG IVPB
100.0000 mg | INTRAVENOUS | Status: DC
  Administered 2018-08-12: 100 mg via INTRAVENOUS
  Filled 2018-08-12: qty 50

## 2018-08-12 MED ORDER — ALUM & MAG HYDROXIDE-SIMETH 200-200-20 MG/5ML PO SUSP: 30.0000 mL | Freq: Four times a day (QID) | ORAL | Status: DC | PRN

## 2018-08-12 MED ORDER — FAMOTIDINE IN NACL 20-0.9 MG/50ML-% IV SOLN
20.0000 mg | Freq: Two times a day (BID) | INTRAVENOUS | Status: DC
  Administered 2018-08-12 – 2018-08-13 (×3): 20 mg via INTRAVENOUS
  Filled 2018-08-12 (×3): qty 50

## 2018-08-12 MED ORDER — POTASSIUM CHLORIDE ER 20 MEQ PO TBCR: 20.0000 meq | EXTENDED_RELEASE_TABLET | Freq: Every day | ORAL | Status: DC

## 2018-08-12 MED ORDER — LAMOTRIGINE 25 MG PO TABS
25.0000 mg | ORAL_TABLET | Freq: Every day | ORAL | Status: DC
  Administered 2018-08-12 – 2018-08-13 (×2): 25 mg via ORAL
  Filled 2018-08-12 (×2): qty 1

## 2018-08-12 MED ORDER — ADULT MULTIVITAMIN W/MINERALS CH
1.0000 | ORAL_TABLET | Freq: Every day | ORAL | Status: DC
  Administered 2018-08-12 – 2018-08-13 (×2): 1 via ORAL
  Filled 2018-08-12 (×2): qty 1

## 2018-08-12 MED ORDER — VITAMIN B-1 100 MG PO TABS
100.0000 mg | ORAL_TABLET | Freq: Every day | ORAL | Status: DC
  Administered 2018-08-12 – 2018-08-13 (×2): 100 mg via ORAL
  Filled 2018-08-12 (×2): qty 1

## 2018-08-12 MED ORDER — NALTREXONE HCL 50 MG PO TABS: 25.0000 mg | ORAL_TABLET | Freq: Every day | ORAL | Status: DC

## 2018-08-12 MED ORDER — FOLIC ACID 1 MG PO TABS
1.0000 mg | ORAL_TABLET | Freq: Every day | ORAL | Status: DC
  Administered 2018-08-12 – 2018-08-13 (×2): 1 mg via ORAL
  Filled 2018-08-12 (×2): qty 1

## 2018-08-12 MED ORDER — THIAMINE HCL 100 MG PO TABS: 100.0000 mg | ORAL_TABLET | Freq: Every day | ORAL | Status: DC

## 2018-08-12 MED ORDER — PROMETHAZINE HCL 25 MG PO TABS: 12.5000 mg | ORAL_TABLET | Freq: Four times a day (QID) | ORAL | Status: DC | PRN

## 2018-08-12 MED ORDER — CYCLOBENZAPRINE HCL 10 MG PO TABS
10.0000 mg | ORAL_TABLET | Freq: Once | ORAL | Status: AC
  Administered 2018-08-12: 10 mg via ORAL
  Filled 2018-08-12: qty 1

## 2018-08-12 MED ORDER — GI COCKTAIL ~~LOC~~: 30.0000 mL | Freq: Three times a day (TID) | ORAL | Status: DC | PRN

## 2018-08-12 MED ORDER — ACETAMINOPHEN 325 MG PO TABS: 650.0000 mg | ORAL_TABLET | Freq: Four times a day (QID) | ORAL | Status: DC | PRN

## 2018-08-12 MED ORDER — LORAZEPAM 1 MG PO TABS: 1.0000 mg | ORAL_TABLET | Freq: Three times a day (TID) | ORAL | Status: DC | PRN

## 2018-08-12 MED ORDER — PRAZOSIN HCL 2 MG PO CAPS: 4.0000 mg | ORAL_CAPSULE | Freq: Every day | ORAL | Status: DC

## 2018-08-12 MED ORDER — FOLIC ACID 1 MG PO TABS: 1.0000 mg | ORAL_TABLET | Freq: Every day | ORAL | Status: DC

## 2018-08-12 MED ORDER — KETOROLAC TROMETHAMINE 30 MG/ML IJ SOLN
30.0000 mg | Freq: Once | INTRAMUSCULAR | Status: AC
  Administered 2018-08-12: 30 mg via INTRAVENOUS
  Filled 2018-08-12: qty 1

## 2018-08-12 MED ORDER — MENTHOL 3 MG MT LOZG
1.0000 | LOZENGE | OROMUCOSAL | Status: DC | PRN
  Filled 2018-08-12: qty 9

## 2018-08-12 MED ORDER — SUCRALFATE 1 GM/10ML PO SUSP: 1.0000 g | Freq: Three times a day (TID) | ORAL | Status: DC

## 2018-08-12 MED ORDER — ACETAMINOPHEN 650 MG RE SUPP: 650.0000 mg | Freq: Four times a day (QID) | RECTAL | Status: DC | PRN

## 2018-08-12 MED ORDER — NICOTINE 21 MG/24HR TD PT24: 21.0000 mg | MEDICATED_PATCH | Freq: Every day | TRANSDERMAL | Status: DC

## 2018-08-12 MED ORDER — ENOXAPARIN SODIUM 40 MG/0.4ML ~~LOC~~ SOLN
40.0000 mg | SUBCUTANEOUS | Status: DC
  Administered 2018-08-12 – 2018-08-13 (×2): 40 mg via SUBCUTANEOUS
  Filled 2018-08-12 (×2): qty 0.4

## 2018-08-12 MED ORDER — SODIUM CHLORIDE 0.9% FLUSH
3.0000 mL | Freq: Two times a day (BID) | INTRAVENOUS | Status: DC
  Administered 2018-08-12 – 2018-08-13 (×3): 3 mL via INTRAVENOUS

## 2018-08-12 MED ORDER — MAGNESIUM OXIDE 400 (241.3 MG) MG PO TABS: 400.0000 mg | ORAL_TABLET | Freq: Every day | ORAL | Status: DC

## 2018-08-12 MED ORDER — POLYETHYLENE GLYCOL 3350 17 G PO PACK: 17.0000 g | PACK | Freq: Every day | ORAL | Status: DC | PRN

## 2018-08-12 MED ORDER — VENLAFAXINE HCL ER 37.5 MG PO CP24
37.5000 mg | ORAL_CAPSULE | Freq: Every day | ORAL | Status: DC
  Administered 2018-08-12 – 2018-08-13 (×2): 37.5 mg via ORAL
  Filled 2018-08-12 (×2): qty 1

## 2018-08-12 MED ORDER — NYSTATIN 100000 UNIT/ML MT SUSP
5.0000 mL | Freq: Four times a day (QID) | OROMUCOSAL | Status: DC
  Administered 2018-08-12 – 2018-08-13 (×6): 500000 [IU] via ORAL
  Filled 2018-08-12 (×7): qty 5

## 2018-08-12 MED ORDER — MAGNESIUM OXIDE 400 (241.3 MG) MG PO TABS
400.0000 mg | ORAL_TABLET | Freq: Every day | ORAL | Status: DC
  Administered 2018-08-12 – 2018-08-13 (×2): 400 mg via ORAL
  Filled 2018-08-12 (×2): qty 1

## 2018-08-12 MED ORDER — PROPRANOLOL HCL ER 60 MG PO CP24
60.0000 mg | ORAL_CAPSULE | Freq: Every day | ORAL | Status: DC
  Administered 2018-08-12 – 2018-08-13 (×2): 60 mg via ORAL
  Filled 2018-08-12 (×2): qty 1

## 2018-08-12 NOTE — H&P (Addendum)
History and Physical    Gina Daniels ZOX:[id number] DOB: [birth year] DOA: 08/12/2018   PCP: Truman Hayward, FNP   Patient coming from:  Home    Chief Complaint: Syncope   HPI: Gina Daniels is a 37 y.o. female with medical history significant for alcohol abuse, marijuana use, history of anorexia, depression, dyslipidemia, history of migraine headaches, no prior history of cardiac arrest in December 2018, with long QT syndrome, believed to be secondary to low potassium and magnesium at the time, and a history of seizure as she went into cardiac arrest.  She presented today after sustaining a syncopal episode occurring last night, after an argument with her husband.  Shortly after the patient was in the room, she began to have a severe coughing spell, at which time her husband rushed to the room.  He stated that she was drooling, and then "went unconscious, and stop breathing".  He states that this episode may have lasted up to 15 seconds, during that time he gave her "2 breaths, and she woke up ".  No confusion was reported after that episode, and there were no other events similar, prior to her admission.  She denies any discrete chest pain, she points to the epigastric and left upper quadrant area right below the ribs, describing these as "cardiac pain ".  She denies any substernal chest pain.  She denies any numbness, or tingling.  She denies any unilateral weakness.  She does complain of sore throat over the last 2 or 3 days, and decreased appetite.  Of note, the patient does have a history of anorexia nervosa and remote bulimia.  She denies any nausea at this time, although she did experience nausea for 2 days prior to this.  She denies any vomiting.  She denies any lower abdominal pain, diarrhea, dysuria, hematuria.  She denies any lower extremity swelling.  She does have a history of headaches, and she takes significant amount of NSAIDs for that, and she has not been able to take any of her  medications due to inability to afford them, for at least 3 or 4 weeks.  She states that prior to these syncopal episode, she experience some aura, without any further vision changes since this admission.  She smokes marijuana at least 3 times a week, last 2 days ago, and she drinks 1 sixpack of beer a day.  She smokes about half pack to 1 pack a day of cigarettes.  She denies any IV drug use.  She denies any history of hepatitis or HIV.  No recent long distance trips.  ED Course:  BP (!) 144/46   Pulse 100   Temp 98.3 F (36.8 C) (Oral)   Resp 18   Ht 5\' 5"  (1.651 m)   Wt 53.6 kg   LMP 08/01/2018   SpO2 96%   BMI 19.66 kg/m    Chemistries are unremarkable.  Troponin 0.  Glucose 78. White count 9.1, hemoglobin 17, MCV 103.7.  Platelets 196. Alcohol 202. Pregnancy test negative Chest x-ray negative Last hepatitis panel in December 2018 was negative. Last 2D echo EF 55 to 60%  troponin is negative Initial EKG showed sinus tachycardia,Probable left atrial enlargement, Anteroseptal infarct, old She was given IV fluids, and cardiology was consulted by phone by EDP, who did not recommend any specific interventions at this time, other than serial cardiac enzymes and observation. CIWA protocol initiated at the ED   Review of Systems:  As per HPI otherwise all other  systems reviewed and are negative  Past Medical History:  Diagnosis Date  . Anorexia   . Anorexia nervosa   . Anxiety   . Depression   . 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 Gallstones   . Headache   . Hypertension   . PTSD (post-traumatic stress disorder)     Past Surgical History:  Procedure Laterality Date  . CHALAZION EXCISION     2012 LEFT EYE  . FOOT SURGERY     RIGHT   . INCISION AND DRAINAGE Right 10/05/2014   Procedure: INCISION AND DRAINAGE RIGHT FOOT WOUND WITH APPLICATION OF WOUND VAC;  Surgeon: Toni Arthurs, MD;  Location: [facility] OR;  Service: Orthopedics;  Laterality: Right;  . ORIF  ULNAR FRACTURE Left 08/22/2016   Procedure: OPEN REDUCTION INTERNAL FIXATION (ORIF)  LEFT ULNA FRACTURE;  Surgeon: Tarry Kos, MD;  Location: [facility] OR;  Service: Orthopedics;  Laterality: Left;  . TUBAL LIGATION  2002  . tubal reversal     02/2014  . WISDOM TOOTH EXTRACTION  2002    Social History Social History   Socioeconomic History  . Marital status: Married    Spouse name: Not on file  . Number of children: Not on file  . Years of education: Not on file  . Highest education level: Not on file  Occupational History  . Not on file  Social Needs  . Financial resource strain: Not on file  . Food insecurity:    Worry: Not on file    Inability: Not on file  . Transportation needs:    Medical: Not on file    Non-medical: Not on file  Tobacco Use  . Smoking status: Current Every Day Smoker    Packs/day: 1.00    Years: 21.00    Pack years: 21.00    Types: Cigarettes  . Smokeless tobacco: Never Used  Substance and Sexual Activity  . Alcohol use: Yes    Comment: OCC  . Drug use: Yes    Types: Marijuana    Comment: daily  . Sexual activity: Yes    Birth control/protection: Other-see comments  Lifestyle  . Physical activity:    Days per week: Not on file    Minutes per session: Not on file  . Stress: Not on file  Relationships  . Social connections:    Talks on phone: Not on file    Gets together: Not on file    Attends religious service: Not on file    Active member of club or organization: Not on file    Attends meetings of clubs or organizations: Not on file    Relationship status: Not on file  . Intimate partner violence:    Fear of current or ex partner: Not on file    Emotionally abused: Not on file    Physically abused: Not on file    Forced sexual activity: Not on file  Other Topics Concern  . Not on file  Social History Narrative  . Not on file     Allergies  Allergen Reactions  . Latex Anaphylaxis  . Penicillins Anaphylaxis    Has patient had a  PCN reaction causing immediate rash, facial/tongue/throat swelling, SOB or lightheadedness with hypotension:  yes Has patient had a PCN reaction causing severe rash involving mucus membranes or skin necrosis: no Has patient had a PCN reaction that required hospitalization: yes Has patient had a PCN reaction occurring within the last 10 years: yes If  all of the above answers are "NO", then may proceed with Cephalosporin use.   . Codeine Hives and Rash  . Sulfa Antibiotics Hives  . Morphine And Related Rash  . Tape Rash    Family History  Problem Relation Age of Onset  . Hypertension Mother   . Hypertension Father   . Heart attack Father   . Diabetes Maternal Grandmother        Prior to Admission medications   Medication Sig Start Date End Date Taking? Authorizing Provider  folic acid (FOLVITE) 1 MG tablet Take 1 tablet (1 mg total) by mouth daily. 12/19/17   Shon Hale, MD  hydrOXYzine (ATARAX/VISTARIL) 25 MG tablet Take 1 tablet (25 mg total) by mouth at bedtime as needed for anxiety or itching. 12/18/17   Shon Hale, MD  lamoTRIgine (LAMICTAL) 25 MG tablet Take 1 tablet (25 mg total) by mouth daily. 12/19/17   Shon Hale, MD  LORazepam (ATIVAN) 1 MG tablet Take 1 tablet (1 mg total) by mouth every 8 (eight) hours as needed for anxiety. 12/18/17   Shon Hale, MD  magnesium oxide (MAG-OX) 400 (241.3 Mg) MG tablet Take 1 tablet (400 mg total) by mouth daily. 12/18/17   Shon Hale, MD  Multiple Vitamin (MULTIVITAMIN WITH MINERALS) TABS tablet Take 1 tablet by mouth daily. 12/19/17   Shon Hale, MD  naltrexone (DEPADE) 50 MG tablet Take 0.5 tablets (25 mg total) by mouth daily. Patient not taking: Reported on 12/13/2017 10/23/16   Withrow, Everardo All, FNP  nicotine (NICODERM CQ - DOSED IN MG/24 HOURS) 21 mg/24hr patch Place 1 patch (21 mg total) onto the skin daily. Patient not taking: Reported on 12/13/2017 10/23/16   Beau Fanny, FNP  ondansetron  (ZOFRAN ODT) 4 MG disintegrating tablet Take 1 tablet (4 mg total) by mouth every 8 (eight) hours as needed for nausea or vomiting. Patient not taking: Reported on 12/13/2017 08/19/17   Dietrich Pates, PA-C  Potassium Chloride ER 20 MEQ TBCR Take 20 mEq by mouth daily. 1 tab daily by mouth 12/18/17   Shon Hale, MD  prazosin (MINIPRESS) 2 MG capsule Take 2 capsules (4 mg total) by mouth at bedtime. Patient not taking: Reported on 12/13/2017 10/22/16   Beau Fanny, FNP  propranolol ER (INDERAL LA) 60 MG 24 hr capsule Take 1 capsule (60 mg total) by mouth daily. 12/19/17   Shon Hale, MD  sucralfate (CARAFATE) 1 GM/10ML suspension Take 10 mLs (1 g total) by mouth 4 (four) times daily -  with meals and at bedtime. 12/18/17   Shon Hale, MD  thiamine 100 MG tablet Take 1 tablet (100 mg total) by mouth daily. 12/18/17   Shon Hale, MD  traZODone (DESYREL) 50 MG tablet Take 1 tablet (50 mg total) by mouth at bedtime as needed for sleep. Patient not taking: Reported on 12/13/2017 10/22/16   Beau Fanny, FNP  venlafaxine XR (EFFEXOR-XR) 37.5 MG 24 hr capsule Take 1 capsule (37.5 mg total) by mouth daily with breakfast. With one 150mg  capsule 12/18/17   Shon Hale, MD     Physical Exam:  Vitals:   08/12/18 0730 08/12/18 0745 08/12/18 0800 08/12/18 0815  BP: 134/73 (!) 154/81 128/78 (!) 144/46  Pulse: 85 (!) 135 80 100  Resp: (!) 23 (!) 25 14 18   Temp:      TempSrc:      SpO2: 97% 99% 99% 96%  Weight:      Height:       Constitutional:  NAD, mildly anxious, looks older than her stated age. Disheveled appearance Eyes: PERRL, lids and conjunctivae normal ENMT: Mucous membranes are dry, thrush noted on tongue, cannot visualize it on her pharynx.  Neck: normal, supple, no masses, no thyromegaly Respiratory: clear to auscultation bilaterally, no wheezing, no crackles. Normal respiratory effort  Cardiovascular: Regular rate and rhythm,  murmur, rubs or gallops. No  extremity edema. 2+ pedal pulses. No carotid bruits.  Abdomen: Soft, tender at the epigastric area and mildly tender at the LUQ   No hepatosplenomegaly. Bowel sounds positive.  Musculoskeletal: no clubbing / cyanosis. Moves all extremities Skin: no jaundice, No lesions. Multiple tattoos Neurologic: Sensation intact  Strength equal in all extremities Psychiatric:   Alert and oriented x 3. Annxious  mood.     Labs on Admission: I have personally reviewed following labs and imaging studies  CBC: Recent Labs  Lab 08/12/18 0507  WBC 9.1  HGB 17.0*  HCT 50.6*  MCV 103.7*  PLT 196    Basic Metabolic Panel: Recent Labs  Lab 08/12/18 0507  NA 145  K 4.5  CL 106  CO2 22  GLUCOSE 95  BUN <5*  CREATININE 0.64  CALCIUM 9.0    GFR: Estimated Creatinine Clearance: 80.7 mL/min (by C-G formula based on SCr of 0.64 mg/dL).  Liver Function Tests: No results for input(s): AST, ALT, ALKPHOS, BILITOT, PROT, ALBUMIN in the last 168 hours. No results for input(s): LIPASE, AMYLASE in the last 168 hours. No results for input(s): AMMONIA in the last 168 hours.  Coagulation Profile: No results for input(s): INR, PROTIME in the last 168 hours.  Cardiac Enzymes: No results for input(s): CKTOTAL, CKMB, CKMBINDEX, TROPONINI in the last 168 hours.  BNP (last 3 results) No results for input(s): PROBNP in the last 8760 hours.  HbA1C: No results for input(s): HGBA1C in the last 72 hours.  CBG: Recent Labs  Lab 08/12/18 0708  GLUCAP 78    Lipid Profile: No results for input(s): CHOL, HDL, LDLCALC, TRIG, CHOLHDL, LDLDIRECT in the last 72 hours.  Thyroid Function Tests: No results for input(s): TSH, T4TOTAL, FREET4, T3FREE, THYROIDAB in the last 72 hours.  Anemia Panel: No results for input(s): VITAMINB12, FOLATE, FERRITIN, TIBC, IRON, RETICCTPCT in the last 72 hours.  Urine analysis:    Component Value Date/Time   COLORURINE YELLOW 08/19/2017 1926   APPEARANCEUR CLEAR 08/19/2017  1926   LABSPEC 1.020 08/19/2017 1926   PHURINE 6.0 08/19/2017 1926   GLUCOSEU NEGATIVE 08/19/2017 1926   HGBUR NEGATIVE 08/19/2017 1926   BILIRUBINUR MODERATE (A) 08/19/2017 1926   KETONESUR 80 (A) 08/19/2017 1926   PROTEINUR 30 (A) 08/19/2017 1926   UROBILINOGEN 0.2 04/30/2015 0224   NITRITE NEGATIVE 08/19/2017 1926   LEUKOCYTESUR NEGATIVE 08/19/2017 1926    Sepsis Labs: @LABRCNTIP (procalcitonin:4,lacticidven:4) )No results found for this or any previous visit (from the past 240 hour(s)).   Radiological Exams on Admission: Dg Chest 2 View  Result Date: 08/12/2018 CLINICAL DATA:  Chest pain. EXAM: CHEST - 2 VIEW COMPARISON:  Radiograph 12/13/2017 FINDINGS: The cardiomediastinal contours are normal. The lungs are clear. Pulmonary vasculature is normal. No consolidation, pleural effusion, or pneumothorax. No acute osseous abnormalities are seen. IMPRESSION: Negative radiographs of the chest. Electronically Signed   By: Rubye OaksMelanie  Ehinger M.D.   On: 08/12/2018 05:41    EKG: Independently reviewed.  Assessment/Plan Principal Problem:   Syncope Active Problems:   Chest pain   Alcohol abuse   Major depressive disorder, recurrent severe without psychotic features (HCC)  Cardiac arrest with ventricular fibrillation/Prolonged QT   Long Q-T syndrome   Malnutrition of moderate degree      Syncope, unclear etiology, possible vasovagal versus metabolic versus psychosomatic. History of cardiac arrest in Dec 2018 felt to be metabolic,  Labs, EKG unrevealing.Tn 0 Neuro exam normal.  [place] syncope score places patient on the low risk group. Less likely cardiac  Last 2D echo EF 55 to 60% . K normal. Mg pending .She was given IV fluids, and cardiology was consulted by phone by EDP, who did not recommend any specific interventions at this time, other than serial cardiac enzymes and observation. Syncope order set  Observation Tele bed. Check orthostatics Fall precautions TILT table  testing may be considered as outpatient in view of possible vasovagal etiology IV fluids  Neuro checks Check Tn and EKG as recommended by Cards Will obtain Psych consultation, rule out psychosocial component in view of unclear cause of syncope Will not hold her BB for now, if BP allows, as this syncope less likely orthostatic or of cardiac nature  Chest pain syndrome/  History of VFib Dec 2018/H/O prolonged QT syndrome-likely electrolyte induced (at the time K2.5 and Mg 1, today with normal value). EKG nromal. Tn 0. Cards discussed with EDP by phone, no interventions recommended.     HEART score  3 . Troponin 0 , EKG without evidence of acute changes. CPunrelieved by nitroglycerin, aspirin. CXR unrevealing. 2 D echo on 11/2017 normal   Telemetry  Cycle troponins EKG in am continue ASA, O2 and NTG as needed  GI cocktail Continue Propanolol if BP allows  Epigastric and LUQ pain the setting of ETOH LFTs are unremarkable . No history of pancreatitis or gastritis. Of note, patient was noted to have thrush on tongue and possible pharynx. In addition patient does take significant amount of NSAISDs. No bleeding issues noted    check amylase  VFs, pain control  ETOH cessation program. Diflucan IV (careful dose due to prior history of prolongued QT) in view of oral candidiasis and may extend to esophageal area   GI cocktail  May consider GI evaluation if patient remains in hospital and symptoms di not improve, otherwise, would recommend GI as OP for possible endoscopy in the near future  Alcohol abuse and dependence and a history of marijuana use,   at risk for withdrawal. ETOH level 202 Last hepatitis panel in December 2018 was negative. CIWA initiated at the ED  Telemetry  CIWA with Ativan per protocol  Thiamine, folate, and MVI Check hepatitis panel  Check HIV Check UDS   History of Migraine headaches Has not taken her Lamictal for at least 1 month as she has been unable to afford Resume  Lamictal  Depression Anxiety Resume home Effexor and Ativan   Erythrocytosis, Hb 17, likely hemoconcentrated in a patient with poor  oral intake and ETOH abuse. MCV is 103.7  IVF Repeat CBC in am    History of Seizure in 11/2017 (likely due to ETOH ), no recurrence.  Continue to monitor  No EEG at this time    Social issues Polysubstance abuse Malnutrition, Alb 2.9  Obtain Care management consult for meds needs Obtain Psych evaluation, to evaluate living situation, possible psychogenic origin of this syncopal episode Consult to dietitian  DVT prophylaxis:  Lovenox Code Status:    Full code  Family Communication:  Discussed with patient Disposition Plan: Expect patient to be discharged to home after condition improves Consults called:    Psych  Admission status:  Obs tele   Marlowe Kays, PA-C [facility]   Amion text  [phone]   08/12/2018, 8:51 AM

## 2018-08-12 NOTE — Care Management (Signed)
This is a no charge note  Pending admission per Dr. Glee ArvinPolllina   37 year old lady with a past medical history of V. fib with cardiac arrest 11/2017 (had QT C prolongation), hypertension, GERD, depression with anxiety, PTSD, anorexia nodosa, tobacco abuse, alcohol abuse, who presents with chest pain and syncope.  Patient had syncope episode and chest pain after arguing with her husband. Patient was found to have WBC 9.1, troponin negative, negative pregnancy test, electrolytes renal function okay, temperature normal, magnesium 1.8, potassium 4.5, heart rate 97, respiration 22, oxygen saturation 93 to 98%, temperature normal.  EKG showed QTC 470 and LVH. Chest x-ray negative. EDP discussed with on-call cardiology.  "He did not recommend any specific interventions at this time, other than serial cardiac enzymes and observation in the hospital". Pt is placed on tele bed for obs.    Gina HarpXilin Trajan Grove, MD  [facility] Pager [phone]  If 7PM-7AM, please contact night-coverage [email or web address] Password [email or web address] 08/12/2018, 6:43 AM

## 2018-08-12 NOTE — ED Notes (Signed)
EMS gave pt [phone] Aspirin and 1 nitro for chest pain w/out relief

## 2018-08-12 NOTE — ED Triage Notes (Signed)
Pt arrives to ED from home with complaints of chest pain and syncopal episode that occurred tonight. EMS reports The pt and her husband were arguing tonight, shortly after the pt was in her room calling out for help, when the pt husband got to the pt he stated "she was coughing and drooling on herself, she then went unconscious and stopped breathing" Her husband gave her 2 breaths and she "immediately became conscious and alert at baseline. After she came alert she started having chest pain. She has long QT syndrome. The pt stated she feels like her electrolytes are low and the last time that happened in December 2018 she had a seizure and went into cardiac arrest. Pt has been vomiting since Friday. Pt has been off of all her home meds for a few weeks due to not being able to afford them. Pt placed in position of comfort with bed locked and lowered, call bell in reach.

## 2018-08-12 NOTE — ED Provider Notes (Signed)
[facility] Provider Note   CSN: [id number] Arrival date & time: 08/12/18  0456     History   Chief Complaint Chief Complaint  Patient presents with  . Chest Pain  . Loss of Consciousness    HPI Tonna BoehringerJenny B Letendre is a 37 y.o. female.  Patient presents to the emergency department for evaluation of syncopal episode.  Patient reports that she was lying on her couch watching TV with her significant other when this occurred.  Significant other reports that she briefly stopped breathing when she passed out.  No CPR was administered.  Patient reports that she has been feeling weak and sick for the last several days.  Yesterday around 10 PM she started having epigastric pain.  This continues currently.  No nausea, vomiting, diarrhea.     Past Medical History:  Diagnosis Date  . Anorexia   . Anorexia nervosa   . Anxiety   . Depression   . 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 Gallstones   . Headache   . Hypertension   . PTSD (post-traumatic stress disorder)     Patient Active Problem List   Diagnosis Date Noted  . Malnutrition of moderate degree 12/18/2017  . Long Q-T syndrome 12/17/2017  . Hypokalemia/Hypomagnesemia 12/17/2017  . Cardiac arrest with ventricular fibrillation/Prolonged QT 12/13/2017  . Left forearm pain 01/24/2017  . Alcohol abuse 10/17/2016  . 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 MDD (major depressive disorder), recurrent episode, severe (HCC) 10/17/2016  . Postoperative wound dehiscence 10/05/2014    Past Surgical History:  Procedure Laterality Date  . CHALAZION EXCISION     2012 LEFT EYE  . FOOT SURGERY     RIGHT   . INCISION AND DRAINAGE Right 10/05/2014   Procedure: INCISION AND DRAINAGE RIGHT FOOT WOUND WITH APPLICATION OF WOUND VAC;  Surgeon: Toni ArthursJohn Hewitt, MD;  Location: [facility] OR;  Service: Orthopedics;  Laterality: Right;  . ORIF ULNAR  FRACTURE Left 08/22/2016   Procedure: OPEN REDUCTION INTERNAL FIXATION (ORIF)  LEFT ULNA FRACTURE;  Surgeon: Tarry KosNaiping M Xu, MD;  Location: [facility] OR;  Service: Orthopedics;  Laterality: Left;  . TUBAL LIGATION  2002  . tubal reversal     02/2014  . WISDOM TOOTH EXTRACTION  2002     OB History   None      Home Medications    Prior to Admission medications   Medication Sig Start Date End Date Taking? Authorizing Provider  folic acid (FOLVITE) 1 MG tablet Take 1 tablet (1 mg total) by mouth daily. 12/19/17   Shon HaleEmokpae, Courage, MD  hydrOXYzine (ATARAX/VISTARIL) 25 MG tablet Take 1 tablet (25 mg total) by mouth at bedtime as needed for anxiety or itching. 12/18/17   Shon HaleEmokpae, Courage, MD  lamoTRIgine (LAMICTAL) 25 MG tablet Take 1 tablet (25 mg total) by mouth daily. 12/19/17   Shon HaleEmokpae, Courage, MD  LORazepam (ATIVAN) 1 MG tablet Take 1 tablet (1 mg total) by mouth every 8 (eight) hours as needed for anxiety. 12/18/17   Shon HaleEmokpae, Courage, MD  magnesium oxide (MAG-OX) 400 (241.3 Mg) MG tablet Take 1 tablet (400 mg total) by mouth daily. 12/18/17   Shon HaleEmokpae, Courage, MD  Multiple Vitamin (MULTIVITAMIN WITH MINERALS) TABS tablet Take 1 tablet by mouth daily. 12/19/17   Shon HaleEmokpae, Courage, MD  naltrexone (DEPADE) 50 MG tablet Take 0.5 tablets (25 mg total) by mouth daily. Patient not taking: Reported on 12/13/2017 10/23/16  Withrow, Everardo All, FNP  nicotine (NICODERM CQ - DOSED IN MG/24 HOURS) 21 mg/24hr patch Place 1 patch (21 mg total) onto the skin daily. Patient not taking: Reported on 12/13/2017 10/23/16   Beau Fanny, FNP  ondansetron (ZOFRAN ODT) 4 MG disintegrating tablet Take 1 tablet (4 mg total) by mouth every 8 (eight) hours as needed for nausea or vomiting. Patient not taking: Reported on 12/13/2017 08/19/17   Dietrich Pates, PA-C  Potassium Chloride ER 20 MEQ TBCR Take 20 mEq by mouth daily. 1 tab daily by mouth 12/18/17   Shon Hale, MD  prazosin (MINIPRESS) 2 MG capsule Take 2 capsules  (4 mg total) by mouth at bedtime. Patient not taking: Reported on 12/13/2017 10/22/16   Beau Fanny, FNP  propranolol ER (INDERAL LA) 60 MG 24 hr capsule Take 1 capsule (60 mg total) by mouth daily. 12/19/17   Shon Hale, MD  sucralfate (CARAFATE) 1 GM/10ML suspension Take 10 mLs (1 g total) by mouth 4 (four) times daily -  with meals and at bedtime. 12/18/17   Shon Hale, MD  thiamine 100 MG tablet Take 1 tablet (100 mg total) by mouth daily. 12/18/17   Shon Hale, MD  traZODone (DESYREL) 50 MG tablet Take 1 tablet (50 mg total) by mouth at bedtime as needed for sleep. Patient not taking: Reported on 12/13/2017 10/22/16   Beau Fanny, FNP  venlafaxine XR (EFFEXOR-XR) 37.5 MG 24 hr capsule Take 1 capsule (37.5 mg total) by mouth daily with breakfast. With one 150mg  capsule 12/18/17   Shon Hale, MD    Family History Family History  Problem Relation Age of Onset  . Hypertension Mother   . Hypertension Father   . Heart attack Father   . Diabetes Maternal Grandmother     Social History Social History   Tobacco Use  . Smoking status: Current Every Day Smoker    Packs/day: 1.00    Years: 21.00    Pack years: 21.00    Types: Cigarettes  . Smokeless tobacco: Never Used  Substance Use Topics  . Alcohol use: Yes    Comment: OCC  . Drug use: Yes    Types: Marijuana    Comment: daily     Allergies   Latex; Penicillins; Codeine; Sulfa antibiotics; Morphine and related; and Tape   Review of Systems Review of Systems  Cardiovascular: Positive for chest pain.  Gastrointestinal: Positive for abdominal pain.  Neurological: Positive for syncope.  All other systems reviewed and are negative.    Physical Exam Updated Vital Signs BP (!) 144/89   Pulse 97   Temp 98.3 F (36.8 C) (Oral)   Resp (!) 22   Ht 5\' 5"  (1.651 m)   Wt 53.6 kg   LMP 08/01/2018   SpO2 98%   BMI 19.66 kg/m   Physical Exam  Constitutional: She is oriented to person,  place, and time. She appears well-developed and well-nourished. No distress.  HENT:  Head: Normocephalic and atraumatic.  Right Ear: Hearing normal.  Left Ear: Hearing normal.  Nose: Nose normal.  Mouth/Throat: Oropharynx is clear and moist and mucous membranes are normal.  Eyes: Pupils are equal, round, and reactive to light. Conjunctivae and EOM are normal.  Neck: Normal range of motion. Neck supple.  Cardiovascular: Regular rhythm, S1 normal and S2 normal. Exam reveals no gallop and no friction rub.  No murmur heard. Pulmonary/Chest: Effort normal and breath sounds normal. No respiratory distress. She exhibits tenderness.    Abdominal: Soft.  Normal appearance and bowel sounds are normal. There is no hepatosplenomegaly. There is tenderness in the epigastric area. There is no rebound, no guarding, no tenderness at McBurney's point and negative Murphy's sign. No hernia.  Musculoskeletal: Normal range of motion.  Neurological: She is alert and oriented to person, place, and time. She has normal strength. No cranial nerve deficit or sensory deficit. Coordination normal. GCS eye subscore is 4. GCS verbal subscore is 5. GCS motor subscore is 6.  Skin: Skin is warm, dry and intact. No rash noted. No cyanosis.  Psychiatric: She has a normal mood and affect. Her speech is normal and behavior is normal. Thought content normal.  Nursing note and vitals reviewed.    ED Treatments / Results  Labs (all labs ordered are listed, but only abnormal results are displayed) Labs Reviewed  BASIC METABOLIC PANEL - Abnormal; Notable for the following components:      Result Value   BUN <5 (*)    Anion gap 17 (*)    All other components within normal limits  CBC - Abnormal; Notable for the following components:   Hemoglobin 17.0 (*)    HCT 50.6 (*)    MCV 103.7 (*)    MCH 34.8 (*)    All other components within normal limits  I-STAT TROPONIN, ED  I-STAT BETA HCG BLOOD, ED ([facility], [facility], [facility] ONLY)     EKG EKG Interpretation  Date/Time:  Tuesday August 12 2018 04:59:39 EDT Ventricular Rate:  105 PR Interval:    QRS Duration: 73 QT Interval:  355 QTC Calculation: 470 R Axis:   38 Text Interpretation:  Sinus tachycardia Probable left atrial enlargement Anteroseptal infarct, old Confirmed by Gilda CreasePollina, [place] J [phone]([id number]) on 08/12/2018 5:50:46 AM   Radiology Dg Chest 2 View  Result Date: 08/12/2018 CLINICAL DATA:  Chest pain. EXAM: CHEST - 2 VIEW COMPARISON:  Radiograph 12/13/2017 FINDINGS: The cardiomediastinal contours are normal. The lungs are clear. Pulmonary vasculature is normal. No consolidation, pleural effusion, or pneumothorax. No acute osseous abnormalities are seen. IMPRESSION: Negative radiographs of the chest. Electronically Signed   By: Rubye OaksMelanie  Ehinger M.D.   On: 08/12/2018 05:41    Procedures Procedures (including critical care time)  Medications Ordered in ED Medications - No data to display   Initial Impression / Assessment and Plan / ED Course  I have reviewed the triage vital signs and the nursing notes.  Pertinent labs & imaging results that were available during my care of the patient were reviewed by me and considered in my medical decision making (see chart for details).     Patient presents to the emergency department for evaluation after syncopal episode.  Patient has a history of V. fib arrest in December of last year.  She was diagnosed with long QT syndrome at that time.  There was a question, however, if this was electrolyte induced.  She has a history of alcohol abuse and at the time of her arrest, she had a potassium of 2.5 and magnesium of 1.0.  During that hospital stay her QT improved as her electrolytes improved.  Her QTc is normal today.  EKG is unremarkable.  Troponin negative.  Electrolytes are normal today.  Discussed with on-call cardiology.  He did not recommend any specific interventions at this time, other than serial cardiac  enzymes and observation in the hospital.  Final Clinical Impressions(s) / ED Diagnoses   Final diagnoses:  Syncope, unspecified syncope type    ED Discharge Orders  None       Gilda Crease, MD 08/12/18 [phone]

## 2018-08-12 NOTE — ED Notes (Signed)
Scanner not working in room IT to be notified.

## 2018-08-12 NOTE — ED Notes (Signed)
Patient transported to X-ray 

## 2018-08-12 NOTE — ED Notes (Signed)
ED Provider at bedside. 

## 2018-08-12 NOTE — ED Notes (Signed)
Pharmacy notified of missing doses.

## 2018-08-12 NOTE — ED Notes (Signed)
Pt never received meal tray that was previously ordered. Pt would like to order her own meal tray upstairs.

## 2018-08-13 DIAGNOSIS — I4581 Long QT syndrome: Secondary | ICD-10-CM | POA: Diagnosis not present

## 2018-08-13 DIAGNOSIS — F488 Other specified nonpsychotic mental disorders: Secondary | ICD-10-CM | POA: Diagnosis not present

## 2018-08-13 DIAGNOSIS — F1010 Alcohol abuse, uncomplicated: Secondary | ICD-10-CM

## 2018-08-13 DIAGNOSIS — F332 Major depressive disorder, recurrent severe without psychotic features: Secondary | ICD-10-CM | POA: Diagnosis not present

## 2018-08-13 DIAGNOSIS — R55 Syncope and collapse: Secondary | ICD-10-CM | POA: Diagnosis not present

## 2018-08-13 LAB — HIV ANTIBODY (ROUTINE TESTING W REFLEX): HIV SCREEN 4TH GENERATION: NONREACTIVE

## 2018-08-13 MED ORDER — NYSTATIN 100000 UNIT/ML MT SUSP: 5.0000 mL | Freq: Four times a day (QID) | OROMUCOSAL | 0 refills | Status: DC

## 2018-08-13 MED ORDER — NYSTATIN 100000 UNIT/ML MT SUSP: 5.0000 mL | Freq: Four times a day (QID) | OROMUCOSAL | 0 refills | Status: AC

## 2018-08-13 NOTE — Care Management Obs Status (Signed)
MEDICARE OBSERVATION STATUS NOTIFICATION   Patient Details  Name: Gina BoehringerJenny B Manternach MRN: [id number] Date of Birth: 1981-07-02   Medicare Observation Status Notification Given:  Yes    Gala LewandowskyGraves-Bigelow, Arlissa Monteverde Kaye, RN 08/13/2018, 10:12 AM

## 2018-08-13 NOTE — Progress Notes (Signed)
Initial Nutrition Assessment  DOCUMENTATION CODES:   Not applicable  INTERVENTION:    General healthy diet education provided.  Encourage small, frequent meals and snacks at home.  NUTRITION DIAGNOSIS:   Inadequate oral intake related to acute illness as evidenced by per patient/family report.  GOAL:   Patient will meet greater than or equal to 90% of their needs  MONITOR:   PO intake  REASON FOR ASSESSMENT:   Malnutrition Screening Tool, Consult Assessment of nutrition requirement/status  ASSESSMENT:    37 yo female with PMH of V fib, cardiac arrest, PTSD, HTN, alcohol abuse, tobacco use, and anorexia nervosa who was admitted on 8/20 with chest pain and syncope.  Patient with 1.5% weight loss within the past 9 months, insignificant for the time frame.  She reports fair intake now and PTA; she complains that the food in the hospital is cold. Plans for d/c home today. Encouraged patient to decrease alcohol intake and increase intake of fresh fruits, vegetables, whole grains, and lean proteins as part of a general healthy diet.  Labs reviewed. Medications reviewed and include folic acid, MVI, Mag-ox, thiamine. Patient is at nutrition risk, given hx of anorexia nervosa and alcohol abuse, however, she is not currently malnourished.   NUTRITION - FOCUSED PHYSICAL EXAM:    Most Recent Value  Orbital Region  No depletion  Upper Arm Region  No depletion  Thoracic and Lumbar Region  No depletion  Buccal Region  No depletion  Temple Region  No depletion  Clavicle Bone Region  No depletion  Clavicle and Acromion Bone Region  No depletion  Scapular Bone Region  No depletion  Dorsal Hand  No depletion  Patellar Region  Mild depletion  Anterior Thigh Region  No depletion  Posterior Calf Region  Mild depletion  Edema (RD Assessment)  None  Hair  Reviewed  Eyes  Reviewed  Mouth  Reviewed  Skin  Reviewed  Nails  Reviewed       Diet Order:   Diet Order           Diet - low sodium heart healthy        Diet regular Room service appropriate? Yes; Fluid consistency: Thin  Diet effective now              EDUCATION NEEDS:   Education needs have been addressed  Skin:  Skin Assessment: Reviewed RN Assessment  Last BM:  8/20  Height:   Ht Readings from Last 1 Encounters:  08/12/18 5\' 5"  (1.651 m)    Weight:   Wt Readings from Last 1 Encounters:  08/13/18 52.8 kg    Ideal Body Weight:  56.8 kg  BMI:  Body mass index is 19.39 kg/m.  Estimated Nutritional Needs:   Kcal:  1600-1800  Protein:  55-75 gm  Fluid:  1.6 L    Joaquin CourtsKimberly Harris, RD, LDN, CNSC Pager [phone] After Hours Pager [phone]
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
## 2020-03-09 ENCOUNTER — Telehealth: Payer: Self-pay | Admitting: Orthopaedic Surgery

## 2020-03-09 NOTE — Telephone Encounter (Signed)
Pt called in stating she recently broke her arm right above a rod Dr. Roda Shutters put in on 08/22/16 and would like to know if he could refer her to someone in [place]? If not the pt has made an appt on 03/16/20. Pt would like a call back to discuss her best option.  [phone]

## 2020-03-10 NOTE — Telephone Encounter (Signed)
Just called and voicemail is full.

## 2020-03-16 ENCOUNTER — Ambulatory Visit: Payer: Medicare Other | Admitting: Orthopaedic Surgery
